# Patient Record
Sex: Male | Born: 1960
Health system: Southern US, Community
[De-identification: ages and names within clinical notes are randomized; demographics above are authoritative.]

## PROBLEM LIST (undated history)

## (undated) DIAGNOSIS — K7581 Nonalcoholic steatohepatitis (NASH): Secondary | ICD-10-CM

## (undated) DIAGNOSIS — E782 Mixed hyperlipidemia: Secondary | ICD-10-CM

## (undated) DIAGNOSIS — Z87442 Personal history of urinary calculi: Secondary | ICD-10-CM

## (undated) DIAGNOSIS — Z86010 Personal history of colonic polyps: Secondary | ICD-10-CM

## (undated) DIAGNOSIS — Z860101 Personal history of adenomatous and serrated colon polyps: Secondary | ICD-10-CM

## (undated) DIAGNOSIS — R351 Nocturia: Secondary | ICD-10-CM

## (undated) DIAGNOSIS — N200 Calculus of kidney: Secondary | ICD-10-CM

## (undated) DIAGNOSIS — E291 Testicular hypofunction: Secondary | ICD-10-CM

## (undated) DIAGNOSIS — I10 Essential (primary) hypertension: Secondary | ICD-10-CM

## (undated) DIAGNOSIS — E785 Hyperlipidemia, unspecified: Secondary | ICD-10-CM

## (undated) DIAGNOSIS — F32A Depression, unspecified: Secondary | ICD-10-CM

## (undated) DIAGNOSIS — K635 Polyp of colon: Secondary | ICD-10-CM

## (undated) DIAGNOSIS — K649 Unspecified hemorrhoids: Secondary | ICD-10-CM

## (undated) DIAGNOSIS — Z973 Presence of spectacles and contact lenses: Secondary | ICD-10-CM

## (undated) DIAGNOSIS — Z8719 Personal history of other diseases of the digestive system: Secondary | ICD-10-CM

## (undated) DIAGNOSIS — H409 Unspecified glaucoma: Secondary | ICD-10-CM

## (undated) DIAGNOSIS — D509 Iron deficiency anemia, unspecified: Secondary | ICD-10-CM

## (undated) DIAGNOSIS — F419 Anxiety disorder, unspecified: Secondary | ICD-10-CM

## (undated) DIAGNOSIS — F329 Major depressive disorder, single episode, unspecified: Secondary | ICD-10-CM

## (undated) DIAGNOSIS — D649 Anemia, unspecified: Secondary | ICD-10-CM

## (undated) HISTORY — DX: Anxiety disorder, unspecified: F41.9

## (undated) HISTORY — DX: Calculus of kidney: N20.0

## (undated) HISTORY — DX: Depression, unspecified: F32.A

## (undated) HISTORY — DX: Polyp of colon: K63.5

## (undated) HISTORY — DX: Major depressive disorder, single episode, unspecified: F32.9

## (undated) HISTORY — DX: Hyperlipidemia, unspecified: E78.5

## (undated) HISTORY — DX: Testicular hypofunction: E29.1

## (undated) HISTORY — DX: Unspecified glaucoma: H40.9

## (undated) HISTORY — DX: Anemia, unspecified: D64.9

## (undated) HISTORY — DX: Essential (primary) hypertension: I10

## (undated) HISTORY — PX: WISDOM TOOTH EXTRACTION: SHX21

## (undated) HISTORY — PX: CYSTOSCOPY: SUR368

## (undated) HISTORY — PX: PENECTOMY: SHX741

## (undated) HISTORY — PX: PROSTATE BIOPSY: SHX241

---

## 2008-08-27 DIAGNOSIS — E291 Testicular hypofunction: Secondary | ICD-10-CM

## 2008-08-27 HISTORY — DX: Testicular hypofunction: E29.1

## 2009-05-27 LAB — HM COLONOSCOPY

## 2009-08-18 HISTORY — PX: COLONOSCOPY: SHX174

## 2009-08-18 LAB — HM COLONOSCOPY

## 2009-08-22 LAB — HM COLONOSCOPY

## 2011-09-24 ENCOUNTER — Ambulatory Visit (INDEPENDENT_AMBULATORY_CARE_PROVIDER_SITE_OTHER): Payer: BC Managed Care – PPO

## 2011-09-24 DIAGNOSIS — B9789 Other viral agents as the cause of diseases classified elsewhere: Secondary | ICD-10-CM

## 2011-10-12 ENCOUNTER — Telehealth: Payer: Self-pay

## 2011-10-23 NOTE — Telephone Encounter (Signed)
Encounter made in error. 

## 2011-11-05 ENCOUNTER — Encounter: Payer: Self-pay | Admitting: Internal Medicine

## 2011-11-05 ENCOUNTER — Ambulatory Visit (INDEPENDENT_AMBULATORY_CARE_PROVIDER_SITE_OTHER): Payer: BC Managed Care – PPO | Admitting: Internal Medicine

## 2011-11-05 ENCOUNTER — Other Ambulatory Visit (INDEPENDENT_AMBULATORY_CARE_PROVIDER_SITE_OTHER): Payer: BC Managed Care – PPO

## 2011-11-05 VITALS — BP 116/80 | HR 79 | Temp 98.3°F | Resp 16 | Ht 75.0 in | Wt 177.0 lb

## 2011-11-05 DIAGNOSIS — E291 Testicular hypofunction: Secondary | ICD-10-CM

## 2011-11-05 DIAGNOSIS — Z0000 Encounter for general adult medical examination without abnormal findings: Secondary | ICD-10-CM

## 2011-11-05 LAB — CBC WITH DIFFERENTIAL/PLATELET
Basophils Relative: 1 % (ref 0.0–3.0)
Eosinophils Absolute: 0.1 10*3/uL (ref 0.0–0.7)
HCT: 44.8 % (ref 39.0–52.0)
Lymphs Abs: 1.5 10*3/uL (ref 0.7–4.0)
MCHC: 34 g/dL (ref 30.0–36.0)
MCV: 92.3 fl (ref 78.0–100.0)
Monocytes Absolute: 0.4 10*3/uL (ref 0.1–1.0)
Neutrophils Relative %: 60.8 % (ref 43.0–77.0)
Platelets: 222 10*3/uL (ref 150.0–400.0)
RBC: 4.86 Mil/uL (ref 4.22–5.81)

## 2011-11-05 LAB — URINALYSIS, ROUTINE W REFLEX MICROSCOPIC
Nitrite: NEGATIVE
Specific Gravity, Urine: 1.02 (ref 1.000–1.030)
Total Protein, Urine: NEGATIVE
Urine Glucose: NEGATIVE
pH: 6 (ref 5.0–8.0)

## 2011-11-05 LAB — PSA: PSA: 2.02 ng/mL (ref 0.10–4.00)

## 2011-11-05 LAB — COMPREHENSIVE METABOLIC PANEL
AST: 21 U/L (ref 0–37)
Albumin: 4.5 g/dL (ref 3.5–5.2)
Alkaline Phosphatase: 70 U/L (ref 39–117)
BUN: 15 mg/dL (ref 6–23)
Glucose, Bld: 77 mg/dL (ref 70–99)
Potassium: 3.8 mEq/L (ref 3.5–5.1)
Total Bilirubin: 0.4 mg/dL (ref 0.3–1.2)

## 2011-11-05 LAB — LIPID PANEL: HDL: 43.4 mg/dL (ref >39.00)

## 2011-11-05 MED ORDER — TESTOSTERONE 50 MG/5GM (1%) TD GEL: 5.0000 g | Freq: Every day | TRANSDERMAL | Status: DC

## 2011-11-05 NOTE — Assessment & Plan Note (Signed)
Exam done, labs ordered, vaccines were updated, pt ed material was given

## 2011-11-05 NOTE — Patient Instructions (Signed)
Health Maintenance, Males A healthy lifestyle and preventative care can promote health and wellness.  Maintain regular health, dental, and eye exams.   Eat a healthy diet. Foods like vegetables, fruits, whole grains, low-fat dairy products, and lean protein foods contain the nutrients you need without too many calories. Decrease your intake of foods high in solid fats, added sugars, and salt. Get information about a proper diet from your caregiver, if necessary.   Regular physical exercise is one of the most important things you can do for your health. Most adults should get at least 150 minutes of moderate-intensity exercise (any activity that increases your heart rate and causes you to sweat) each week. In addition, most adults need muscle-strengthening exercises on 2 or more days a week.    Maintain a healthy weight. The body mass index (BMI) is a screening tool to identify possible weight problems. It provides an estimate of body fat based on height and weight. Your caregiver can help determine your BMI, and can help you achieve or maintain a healthy weight. For adults 20 years and older:   A BMI below 18.5 is considered underweight.   A BMI of 18.5 to 24.9 is normal.   A BMI of 25 to 29.9 is considered overweight.   A BMI of 30 and above is considered obese.   Maintain normal blood lipids and cholesterol by exercising and minimizing your intake of saturated fat. Eat a balanced diet with plenty of fruits and vegetables. Blood tests for lipids and cholesterol should begin at age 20 and be repeated every 5 years. If your lipid or cholesterol levels are high, you are over 50, or you are a high risk for heart disease, you may need your cholesterol levels checked more frequently.Ongoing high lipid and cholesterol levels should be treated with medicines, if diet and exercise are not effective.   If you smoke, find out from your caregiver how to quit. If you do not use tobacco, do not start.    If you choose to drink alcohol, do not exceed 2 drinks per day. One drink is considered to be 12 ounces (355 mL) of beer, 5 ounces (148 mL) of wine, or 1.5 ounces (44 mL) of liquor.   Avoid use of street drugs. Do not share needles with anyone. Ask for help if you need support or instructions about stopping the use of drugs.   High blood pressure causes heart disease and increases the risk of stroke. Blood pressure should be checked at least every 1 to 2 years. Ongoing high blood pressure should be treated with medicines if weight loss and exercise are not effective.   If you are 45 to 52 years old, ask your caregiver if you should take aspirin to prevent heart disease.   Diabetes screening involves taking a blood sample to check your fasting blood sugar level. This should be done once every 3 years, after age 45, if you are within normal weight and without risk factors for diabetes. Testing should be considered at a younger age or be carried out more frequently if you are overweight and have at least 1 risk factor for diabetes.   Colorectal cancer can be detected and often prevented. Most routine colorectal cancer screening begins at the age of 50 and continues through age 75. However, your caregiver may recommend screening at an earlier age if you have risk factors for colon cancer. On a yearly basis, your caregiver may provide home test kits to check for hidden   blood in the stool. Use of a small camera at the end of a tube, to directly examine the colon (sigmoidoscopy or colonoscopy), can detect the earliest forms of colorectal cancer. Talk to your caregiver about this at age 50, when routine screening begins. Direct examination of the colon should be repeated every 5 to 10 years through age 75, unless early forms of pre-cancerous polyps or small growths are found.   Hepatitis C blood testing is recommended for all people born from 1945 through 1965 and any individual with known risks for  hepatitis C.   Healthy men should no longer receive prostate-specific antigen (PSA) blood tests as part of routine cancer screening. Consult with your caregiver about prostate cancer screening.   Testicular cancer screening is not recommended for adolescents or adult males who have no symptoms. Screening includes self-exam, caregiver exam, and other screening tests. Consult with your caregiver about any symptoms you have or any concerns you have about testicular cancer.   Practice safe sex. Use condoms and avoid high-risk sexual practices to reduce the spread of sexually transmitted infections (STIs).   Use sunscreen with a sun protection factor (SPF) of 30 or greater. Apply sunscreen liberally and repeatedly throughout the day. You should seek shade when your shadow is shorter than you. Protect yourself by wearing long sleeves, pants, a wide-brimmed hat, and sunglasses year round, whenever you are outdoors.   Notify your caregiver of new moles or changes in moles, especially if there is a change in shape or color. Also notify your caregiver if a mole is larger than the size of a pencil eraser.   A one-time screening for abdominal aortic aneurysm (AAA) and surgical repair of large AAAs by sound wave imaging (ultrasonography) is recommended for ages 65 to 75 years who are current or former smokers.   Stay current with your immunizations.  Document Released: 02/09/2008 Document Revised: 08/02/2011 Document Reviewed: 01/08/2011 [employer] Patient Information 2012 [employer]. 

## 2011-11-05 NOTE — Assessment & Plan Note (Signed)
He has not used the testim recently, today I will check his testosterone level and offer him to get back on testim if he wants to

## 2011-11-05 NOTE — Progress Notes (Signed)
  Subjective:    Patient ID: Andrew Wright, male    DOB: 1960/10/07, 51 y.o.   MRN: [id number]  HPI New to me for a complete physical, he offers no complaints today.   Review of Systems  Constitutional: Negative.   HENT: Negative.   Eyes: Negative.   Respiratory: Negative.   Cardiovascular: Negative.   Gastrointestinal: Negative.   Genitourinary: Negative.   Musculoskeletal: Negative.   Skin: Negative.   Neurological: Negative.   Hematological: Negative.   Psychiatric/Behavioral: Negative.        Objective:   Physical Exam  Vitals reviewed. Constitutional: He is oriented to person, place, and time. He appears well-developed and well-nourished. No distress.  HENT:  Head: Normocephalic and atraumatic.  Mouth/Throat: Oropharynx is clear and moist. No oropharyngeal exudate.  Eyes: Conjunctivae are normal. Right eye exhibits no discharge. Left eye exhibits no discharge. No scleral icterus.  Neck: Normal range of motion. Neck supple. No JVD present. No tracheal deviation present. No thyromegaly present.  Cardiovascular: Normal rate, regular rhythm, normal heart sounds and intact distal pulses.  Exam reveals no gallop and no friction rub.   No murmur heard. Pulmonary/Chest: Effort normal and breath sounds normal. No stridor. No respiratory distress. He has no wheezes. He has no rales. He exhibits no tenderness.  Abdominal: Soft. Bowel sounds are normal. He exhibits no distension and no mass. There is no tenderness. There is no rebound and no guarding. Hernia confirmed negative in the right inguinal area and confirmed negative in the left inguinal area.  Genitourinary: Rectum normal, prostate normal, testes normal and penis normal. Rectal exam shows no external hemorrhoid, no internal hemorrhoid, no fissure, no mass, no tenderness and anal tone normal. Guaiac negative stool. Prostate is not enlarged and not tender. Right testis shows no mass, no swelling and no tenderness. Right testis is  descended. Left testis shows no mass, no swelling and no tenderness. Left testis is descended. Circumcised. No penile tenderness. No discharge found.  Musculoskeletal: Normal range of motion. He exhibits no edema and no tenderness.  Lymphadenopathy:    He has no cervical adenopathy.       Right: No inguinal adenopathy present.       Left: No inguinal adenopathy present.  Neurological: He is oriented to person, place, and time.  Skin: Skin is warm and dry. No rash noted. He is not diaphoretic. No erythema. No pallor.  Psychiatric: He has a normal mood and affect. His behavior is normal. Judgment and thought content normal.          Assessment & Plan:

## 2011-11-06 ENCOUNTER — Encounter: Payer: Self-pay | Admitting: Internal Medicine

## 2011-11-06 LAB — TESTOSTERONE, FREE, TOTAL, SHBG
Sex Hormone Binding: 22 nmol/L (ref 13–71)
Testosterone, Free: 61.8 pg/mL (ref 47.0–244.0)
Testosterone-% Free: 2.4 % (ref 1.6–2.9)
Testosterone: 257.12 ng/dL (ref 250–890)

## 2011-11-06 LAB — LDL CHOLESTEROL, DIRECT: Direct LDL: 122.3 mg/dL

## 2011-12-24 ENCOUNTER — Ambulatory Visit (INDEPENDENT_AMBULATORY_CARE_PROVIDER_SITE_OTHER): Payer: BC Managed Care – PPO | Admitting: Internal Medicine

## 2011-12-24 ENCOUNTER — Encounter: Payer: Self-pay | Admitting: Internal Medicine

## 2011-12-24 VITALS — BP 132/84 | HR 67 | Temp 98.1°F | Resp 16 | Wt 176.0 lb

## 2011-12-24 DIAGNOSIS — E785 Hyperlipidemia, unspecified: Secondary | ICD-10-CM | POA: Insufficient documentation

## 2011-12-24 DIAGNOSIS — E782 Mixed hyperlipidemia: Secondary | ICD-10-CM

## 2011-12-24 DIAGNOSIS — E291 Testicular hypofunction: Secondary | ICD-10-CM

## 2011-12-24 NOTE — Assessment & Plan Note (Signed)
He feels much better on testosterone replacement

## 2011-12-24 NOTE — Patient Instructions (Signed)
Hypertriglyceridemia  Diet for High blood levels of Triglycerides Most fats in food are triglycerides. Triglycerides in your blood are stored as fat in your body. High levels of triglycerides in your blood may put you at a greater risk for heart disease and stroke.  Normal triglyceride levels are less than 150 mg/dL. Borderline high levels are 150-199 mg/dl. High levels are 200 - 499 mg/dL, and very high triglyceride levels are greater than 500 mg/dL. The decision to treat high triglycerides is generally based on the level. For people with borderline or high triglyceride levels, treatment includes weight loss and exercise. Drugs are recommended for people with very high triglyceride levels. Many people who need treatment for high triglyceride levels have metabolic syndrome. This syndrome is a collection of disorders that often include: insulin resistance, high blood pressure, blood clotting problems, high cholesterol and triglycerides. TESTING PROCEDURE FOR TRIGLYCERIDES  You should not eat 4 hours before getting your triglycerides measured. The normal range of triglycerides is between 10 and 250 milligrams per deciliter (mg/dl). Some people may have extreme levels (1000 or above), but your triglyceride level may be too high if it is above 150 mg/dl, depending on what other risk factors you have for heart disease.   People with high blood triglycerides may also have high blood cholesterol levels. If you have high blood cholesterol as well as high blood triglycerides, your risk for heart disease is probably greater than if you only had high triglycerides. High blood cholesterol is one of the main risk factors for heart disease.  CHANGING YOUR DIET  Your weight can affect your blood triglyceride level. If you are more than 20% above your ideal body weight, you may be able to lower your blood triglycerides by losing weight. Eating less and exercising regularly is the best way to combat this. Fat provides  more calories than any other food. The best way to lose weight is to eat less fat. Only 30% of your total calories should come from fat. Less than 7% of your diet should come from saturated fat. A diet low in fat and saturated fat is the same as a diet to decrease blood cholesterol. By eating a diet lower in fat, you may lose weight, lower your blood cholesterol, and lower your blood triglyceride level.  Eating a diet low in fat, especially saturated fat, may also help you lower your blood triglyceride level. Ask your dietitian to help you figure how much fat you can eat based on the number of calories your caregiver has prescribed for you.  Exercise, in addition to helping with weight loss may also help lower triglyceride levels.   Alcohol can increase blood triglycerides. You may need to stop drinking alcoholic beverages.   Too much carbohydrate in your diet may also increase your blood triglycerides. Some complex carbohydrates are necessary in your diet. These may include bread, rice, potatoes, other starchy vegetables and cereals.   Reduce "simple" carbohydrates. These may include pure sugars, candy, honey, and jelly without losing other nutrients. If you have the kind of high blood triglycerides that is affected by the amount of carbohydrates in your diet, you will need to eat less sugar and less high-sugar foods. Your caregiver can help you with this.   Adding 2-4 grams of fish oil (EPA+ DHA) may also help lower triglycerides. Speak with your caregiver before adding any supplements to your regimen.  Following the Diet  Maintain your ideal weight. Your caregivers can help you with a diet. Generally,  eating less food and getting more exercise will help you lose weight. Joining a weight control group may also help. Ask your caregivers for a good weight control group in your area.  Eat low-fat foods instead of high-fat foods. This can help you lose weight too.  These foods are lower in fat. Eat MORE  of these:   Dried beans, peas, and lentils.   Egg whites.   Low-fat cottage cheese.   Fish.   Lean cuts of meat, such as round, sirloin, rump, and flank (cut extra fat off meat you fix).   Whole grain breads, cereals and pasta.   Skim and nonfat dry milk.   Low-fat yogurt.   Poultry without the skin.   Cheese made with skim or part-skim milk, such as mozzarella, parmesan, farmers', ricotta, or pot cheese.  These are higher fat foods. Eat LESS of these:   Whole milk and foods made from whole milk, such as American, blue, cheddar, monterey jack, and swiss cheese   High-fat meats, such as luncheon meats, sausages, knockwurst, bratwurst, hot dogs, ribs, corned beef, ground pork, and regular ground beef.   Fried foods.  Limit saturated fats in your diet. Substituting unsaturated fat for saturated fat may decrease your blood triglyceride level. You will need to read package labels to know which products contain saturated fats.  These foods are high in saturated fat. Eat LESS of these:   Fried pork skins.   Whole milk.   Skin and fat from poultry.   Palm oil.   Butter.   Shortening.   Cream cheese.   Berniece Salines.   Margarines and baked goods made from listed oils.   Vegetable shortenings.   Chitterlings.   Fat from meats.   Coconut oil.   Palm kernel oil.   Lard.   Cream.   Sour cream.   Fatback.   Coffee whiteners and non-dairy creamers made with these oils.   Cheese made from whole milk.  Use unsaturated fats (both polyunsaturated and monounsaturated) moderately. Remember, even though unsaturated fats are better than saturated fats; you still want a diet low in total fat.  These foods are high in unsaturated fat:   Canola oil.   Sunflower oil.   Mayonnaise.   Almonds.   Peanuts.   Pine nuts.   Margarines made with these oils.   Safflower oil.   Olive oil.   Avocados.   Cashews.   Peanut butter.   Sunflower seeds.   Soybean oil.     Peanut oil.   Olives.   Pecans.   Walnuts.   Pumpkin seeds.  Avoid sugar and other high-sugar foods. This will decrease carbohydrates without decreasing other nutrients. Sugar in your food goes rapidly to your blood. When there is excess sugar in your blood, your liver may use it to make more triglycerides. Sugar also contains calories without other important nutrients.  Eat LESS of these:   Sugar, brown sugar, powdered sugar, jam, jelly, preserves, honey, syrup, molasses, pies, candy, cakes, cookies, frosting, pastries, colas, soft drinks, punches, fruit drinks, and regular gelatin.   Avoid alcohol. Alcohol, even more than sugar, may increase blood triglycerides. In addition, alcohol is high in calories and low in nutrients. Ask for sparkling water, or a diet soft drink instead of an alcoholic beverage.  Suggestions for planning and preparing meals   Bake, broil, grill or roast meats instead of frying.   Remove fat from meats and skin from poultry before cooking.   Add spices,  herbs, lemon juice or vinegar to vegetables instead of salt, rich sauces or gravies.   Use a non-stick skillet without fat or use no-stick sprays.   Cool and refrigerate stews and broth. Then remove the hardened fat floating on the surface before serving.   Refrigerate meat drippings and skim off fat to make low-fat gravies.   Serve more fish.   Use less butter, margarine and other high-fat spreads on bread or vegetables.   Use skim or reconstituted non-fat dry milk for cooking.   Cook with low-fat cheeses.   Substitute low-fat yogurt or cottage cheese for all or part of the sour cream in recipes for sauces, dips or congealed salads.   Use half yogurt/half mayonnaise in salad recipes.   Substitute evaporated skim milk for cream. Evaporated skim milk or reconstituted non-fat dry milk can be whipped and substituted for whipped cream in certain recipes.   Choose fresh fruits for dessert instead of  high-fat foods such as pies or cakes. Fruits are naturally low in fat.  When Dining Out   Order low-fat appetizers such as fruit or vegetable juice, pasta with vegetables or tomato sauce.   Select clear, rather than cream soups.   Ask that dressings and gravies be served on the side. Then use less of them.   Order foods that are baked, broiled, poached, steamed, stir-fried, or roasted.   Ask for margarine instead of butter, and use only a small amount.   Drink sparkling water, unsweetened tea or coffee, or diet soft drinks instead of alcohol or other sweet beverages.  QUESTIONS AND ANSWERS ABOUT OTHER FATS IN THE BLOOD: SATURATED FAT, TRANS FAT, AND CHOLESTEROL What is trans fat? Trans fat is a type of fat that is formed when vegetable oil is hardened through a process called hydrogenation. This process helps makes foods more solid, gives them shape, and prolongs their shelf life. Trans fats are also called hydrogenated or partially hydrogenated oils.  What do saturated fat, trans fat, and cholesterol in foods have to do with heart disease? Saturated fat, trans fat, and cholesterol in the diet all raise the level of LDL "bad" cholesterol in the blood. The higher the LDL cholesterol, the greater the risk for coronary heart disease (CHD). Saturated fat and trans fat raise LDL similarly.  What foods contain saturated fat, trans fat, and cholesterol? High amounts of saturated fat are found in animal products, such as fatty cuts of meat, chicken skin, and full-fat dairy products like butter, whole milk, cream, and cheese, and in tropical vegetable oils such as palm, palm kernel, and coconut oil. Trans fat is found in some of the same foods as saturated fat, such as vegetable shortening, some margarines (especially hard or stick margarine), crackers, cookies, baked goods, fried foods, salad dressings, and other processed foods made with partially hydrogenated vegetable oils. Small amounts of trans fat  also occur naturally in some animal products, such as milk products, beef, and lamb. Foods high in cholesterol include liver, other organ meats, egg yolks, shrimp, and full-fat dairy products. How can I use the new food label to make heart-healthy food choices? Check the Nutrition Facts panel of the food label. Choose foods lower in saturated fat, trans fat, and cholesterol. For saturated fat and cholesterol, you can also use the Percent Daily Value (%DV): 5% DV or less is low, and 20% DV or more is high. (There is no %DV for trans fat.) Use the Nutrition Facts panel to choose foods low in  saturated fat and cholesterol, and if the trans fat is not listed, read the ingredients and limit products that list shortening or hydrogenated or partially hydrogenated vegetable oil, which tend to be high in trans fat. POINTS TO REMEMBER: YOU NEED A LITTLE TLC (THERAPEUTIC LIFESTYLE CHANGES)  Discuss your risk for heart disease with your caregivers, and take steps to reduce risk factors.   Change your diet. Choose foods that are low in saturated fat, trans fat, and cholesterol.   Add exercise to your daily routine if it is not already being done. Participate in physical activity of moderate intensity, like brisk walking, for at least 30 minutes on most, and preferably all days of the week. No time? Break the 30 minutes into three, 10-minute segments during the day.   Stop smoking. If you do smoke, contact your caregiver to discuss ways in which they can help you quit.   Do not use street drugs.   Maintain a normal weight.   Maintain a healthy blood pressure.   Keep up with your blood work for checking the fats in your blood as directed by your caregiver.  Document Released: 05/31/2004 Document Revised: 08/02/2011 Document Reviewed: 12/27/2008 [facility] Patient Information 2012 [place].

## 2011-12-24 NOTE — Progress Notes (Signed)
  Subjective:    Patient ID: Andrew Wright, male    DOB: 07-Mar-1961, 51 y.o.   MRN: [id number]  Hyperlipidemia This is a chronic problem. The current episode started more than 1 month ago. The problem is uncontrolled. Recent lipid tests were reviewed and are variable. He has no history of chronic renal disease, diabetes, hypothyroidism, liver disease, obesity or nephrotic syndrome. Factors aggravating his hyperlipidemia include fatty foods. Pertinent negatives include no chest pain, focal sensory loss, focal weakness, leg pain, myalgias or shortness of breath. Current antihyperlipidemic treatment includes diet change. The current treatment provides mild improvement of lipids. Compliance problems include adherence to diet.  Risk factors for coronary artery disease include no known risk factors.      Review of Systems  Constitutional: Negative for fever, chills, diaphoresis, activity change, appetite change, fatigue and unexpected weight change.  HENT: Negative.   Eyes: Negative.   Respiratory: Negative for apnea, cough, choking, chest tightness, shortness of breath, wheezing and stridor.   Cardiovascular: Negative for chest pain, palpitations and leg swelling.  Gastrointestinal: Negative for nausea, vomiting, abdominal pain and diarrhea.  Genitourinary: Negative.   Musculoskeletal: Negative for myalgias, back pain, joint swelling, arthralgias and gait problem.  Skin: Negative for color change, pallor, rash and wound.  Neurological: Negative for dizziness, tremors, focal weakness, seizures, syncope, facial asymmetry, speech difficulty, weakness, light-headedness, numbness and headaches.  Hematological: Negative for adenopathy. Does not bruise/bleed easily.  Psychiatric/Behavioral: Negative.        Objective:   Physical Exam  Vitals reviewed. Constitutional: He is oriented to person, place, and time. He appears well-developed and well-nourished. No distress.  HENT:  Head: Normocephalic and  atraumatic.  Mouth/Throat: Oropharynx is clear and moist. No oropharyngeal exudate.  Eyes: Conjunctivae are normal. Right eye exhibits no discharge. Left eye exhibits no discharge. No scleral icterus.  Neck: Normal range of motion. Neck supple. No JVD present. No tracheal deviation present. No thyromegaly present.  Cardiovascular: Normal rate, regular rhythm, normal heart sounds and intact distal pulses.  Exam reveals no gallop and no friction rub.   No murmur heard. Pulmonary/Chest: Effort normal and breath sounds normal. No stridor. No respiratory distress. He has no wheezes. He has no rales. He exhibits no tenderness.  Abdominal: Soft. Bowel sounds are normal. He exhibits no distension and no mass. There is no tenderness. There is no rebound and no guarding.  Musculoskeletal: Normal range of motion. He exhibits no edema and no tenderness.  Lymphadenopathy:    He has no cervical adenopathy.  Neurological: He is oriented to person, place, and time.  Skin: Skin is warm and dry. No rash noted. He is not diaphoretic. No erythema. No pallor.  Psychiatric: He has a normal mood and affect. His behavior is normal. Judgment and thought content normal.      Lab Results  Component Value Date   WBC 5.1 11/05/2011   HGB 15.2 11/05/2011   HCT 44.8 11/05/2011   PLT 222.0 11/05/2011   GLUCOSE 77 11/05/2011   CHOL 214* 11/05/2011   TRIG 252.0* 11/05/2011   HDL 43.40 11/05/2011   LDLDIRECT 122.3 11/05/2011   ALT 24 11/05/2011   AST 21 11/05/2011   NA 140 11/05/2011   K 3.8 11/05/2011   CL 103 11/05/2011   CREATININE 1.0 11/05/2011   BUN 15 11/05/2011   CO2 31 11/05/2011   PSA 2.02 11/05/2011      Assessment & Plan:

## 2011-12-24 NOTE — Assessment & Plan Note (Signed)
I will recheck his trig level today

## 2012-02-06 ENCOUNTER — Other Ambulatory Visit (INDEPENDENT_AMBULATORY_CARE_PROVIDER_SITE_OTHER): Payer: BC Managed Care – PPO

## 2012-02-06 DIAGNOSIS — E782 Mixed hyperlipidemia: Secondary | ICD-10-CM

## 2012-02-06 LAB — LIPID PANEL: HDL: 41 mg/dL (ref >39.00)

## 2012-02-06 LAB — LDL CHOLESTEROL, DIRECT: Direct LDL: 81.5 mg/dL

## 2012-10-13 ENCOUNTER — Ambulatory Visit (INDEPENDENT_AMBULATORY_CARE_PROVIDER_SITE_OTHER): Payer: BC Managed Care – PPO | Admitting: Internal Medicine

## 2012-10-13 ENCOUNTER — Encounter: Payer: Self-pay | Admitting: Internal Medicine

## 2012-10-13 VITALS — BP 102/76 | HR 85 | Temp 97.5°F | Ht 75.0 in | Wt 180.0 lb

## 2012-10-13 DIAGNOSIS — B029 Zoster without complications: Secondary | ICD-10-CM

## 2012-10-13 MED ORDER — VALACYCLOVIR HCL 1 G PO TABS: 1000.0000 mg | ORAL_TABLET | Freq: Three times a day (TID) | ORAL | Status: DC

## 2012-10-13 NOTE — Progress Notes (Signed)
Subjective:    Patient ID: Andrew Wright, male    DOB: Jan 22, 1961, 52 y.o.   MRN: [id number]  HPI  Pt presents to the clinic today with c/o a rash below his left ear. He noticed this Thursday night. The rash does not itch. It is painful. The pain he describes as sharp, shooting and tingling behind his ear. He has never had a rash like this before. He has been putting calamine lotion and alcohol on it but it has not gotten any better.  Review of Systems      Past Medical History  Diagnosis Date  . Migraine   . Hypogonadism male 2010    Current Outpatient Prescriptions  Medication Sig Dispense Refill  . Ascorbic Acid (VITAMIN C) 1000 MG tablet Take 1,000 mg by mouth daily.      Marland Kitchen testosterone (TESTIM) 50 MG/5GM GEL Place 5 g onto the skin daily.  30 Tube  5  . vitamin B-12 (CYANOCOBALAMIN) 1000 MCG tablet Take 1,000 mcg by mouth daily.       No current facility-administered medications for this visit.    No Known Allergies  Family History  Problem Relation Age of Onset  . Arthritis Other   . Cancer Other     Prostate, Breast,Overian cancer  . Stroke Other     History   Social History  . Marital Status: Married    Spouse Name: N/A    Number of Children: N/A  . Years of Education: N/A   Occupational History  . Not on file.   Social History Main Topics  . Smoking status: Never Smoker   . Smokeless tobacco: Never Used  . Alcohol Use: No  . Drug Use: No  . Sexually Active: Yes   Other Topics Concern  . Not on file   Social History Narrative   Caffienated drinks-yes   Seat belt use often-yes   Regular Exercise-yes   Smoke alarm in the home-yes   Firearms/guns in the home-no   History of physical abuse-no                    Constitutional: Denies fever, malaise, fatigue, headache or abrupt weight changes.  HEENT: Pt reports ringing in his left ear. Denies eye pain, eye redness, ear pain, wax buildup, runny nose, nasal congestion, bloody nose, or sore  throat. Skin: Pt reports rash below left ear.  Neurological: Pt reports some sharp, shooting, tingling pain behind his ear. Denies dizziness, difficulty with memory, difficulty with speech or problems with balance and coordination.   No other specific complaints in a complete review of systems (except as listed in HPI above).  Objective:   Physical Exam  BP 102/76  Pulse 85  Temp(Src) 97.5 F (36.4 C) (Oral)  Ht 6' 3"  (1.905 m)  Wt 180 lb (81.647 kg)  BMI 22.5 kg/m2  SpO2 96% Wt Readings from Last 3 Encounters:  10/13/12 180 lb (81.647 kg)  12/24/11 176 lb (79.833 kg)  11/05/11 177 lb (80.287 kg)    General: Appears his stated age, well developed, well nourished in NAD. Skin: 2 moderate sized patches of vesicular lesions on erythematous base resembling shingles just below the left ear. HEENT: Head: normal shape and size; Eyes: sclera white, no icterus, conjunctiva pink, PERRLA and EOMs intact; Ears: Tm's gray and intact, normal light reflex; Nose: mucosa pink and moist, septum midline; Throat/Mouth: Teeth present, mucosa pink and moist, no exudate, lesions or ulcerations noted.   Cardiovascular: Normal rate and  rhythm. S1,S2 noted.  No murmur, rubs or gallops noted. No JVD or BLE edema. No carotid bruits noted. Pulmonary/Chest: Normal effort and positive vesicular breath sounds. No respiratory distress. No wheezes, rales or ronchi noted.   Neurological: Alert and oriented. Cranial nerves II-XII intact. Coordination normal. +DTRs bilaterally        Assessment & Plan:   Shingles, new onset with additional workup required:  eRx for Valtrex 1000 mg TID x 7 days Stop placing rubbing alcohol on the area Cleanse with warm water and soap When this clears, call to have the shingles vaccined  RTC as needed or if symptoms persist

## 2012-10-13 NOTE — Patient Instructions (Signed)

## 2012-11-03 ENCOUNTER — Ambulatory Visit (INDEPENDENT_AMBULATORY_CARE_PROVIDER_SITE_OTHER): Payer: BC Managed Care – PPO

## 2012-11-03 DIAGNOSIS — Z23 Encounter for immunization: Secondary | ICD-10-CM

## 2012-11-03 DIAGNOSIS — Z2911 Encounter for prophylactic immunotherapy for respiratory syncytial virus (RSV): Secondary | ICD-10-CM

## 2013-11-11 ENCOUNTER — Ambulatory Visit (INDEPENDENT_AMBULATORY_CARE_PROVIDER_SITE_OTHER): Payer: BC Managed Care – PPO | Admitting: Internal Medicine

## 2013-11-11 ENCOUNTER — Encounter: Payer: Self-pay | Admitting: Internal Medicine

## 2013-11-11 VITALS — BP 120/70 | HR 80 | Temp 97.9°F | Resp 16 | Ht 75.0 in | Wt 190.4 lb

## 2013-11-11 DIAGNOSIS — J209 Acute bronchitis, unspecified: Secondary | ICD-10-CM | POA: Insufficient documentation

## 2013-11-11 MED ORDER — HYDROCOD POLST-CHLORPHEN POLST 10-8 MG/5ML PO LQCR: 5.0000 mL | Freq: Two times a day (BID) | ORAL | Status: DC | PRN

## 2013-11-11 MED ORDER — MOXIFLOXACIN HCL 400 MG PO TABS
400.0000 mg | ORAL_TABLET | Freq: Every day | ORAL | Status: DC
Start: 2013-11-11 — End: 2014-10-28

## 2013-11-11 NOTE — Progress Notes (Signed)
Pre visit review using our clinic review tool, if applicable. No additional management support is needed unless otherwise documented below in the visit note. 

## 2013-11-11 NOTE — Patient Instructions (Signed)
Acute Bronchitis Bronchitis is inflammation of the airways that extend from the windpipe into the lungs (bronchi). The inflammation often causes mucus to develop. This leads to a cough, which is the most common symptom of bronchitis.  In acute bronchitis, the condition usually develops suddenly and goes away over time, usually in a couple weeks. Smoking, allergies, and asthma can make bronchitis worse. Repeated episodes of bronchitis may cause further lung problems.  CAUSES Acute bronchitis is most often caused by the same virus that causes a cold. The virus can spread from person to person (contagious).  SIGNS AND SYMPTOMS   Cough.   Fever.   Coughing up mucus.   Body aches.   Chest congestion.   Chills.   Shortness of breath.   Sore throat.  DIAGNOSIS  Acute bronchitis is usually diagnosed through a physical exam. Tests, such as chest X-rays, are sometimes done to rule out other conditions.  TREATMENT  Acute bronchitis usually goes away in a couple weeks. Often times, no medical treatment is necessary. Medicines are sometimes given for relief of fever or cough. Antibiotics are usually not needed but may be prescribed in certain situations. In some cases, an inhaler may be recommended to help reduce shortness of breath and control the cough. A cool mist vaporizer may also be used to help thin bronchial secretions and make it easier to clear the chest.  HOME CARE INSTRUCTIONS  Get plenty of rest.   Drink enough fluids to keep your urine clear or pale yellow (unless you have a medical condition that requires fluid restriction). Increasing fluids may help thin your secretions and will prevent dehydration.   Only take over-the-counter or prescription medicines as directed by your health care provider.   Avoid smoking and secondhand smoke. Exposure to cigarette smoke or irritating chemicals will make bronchitis worse. If you are a smoker, consider using nicotine gum or skin  patches to help control withdrawal symptoms. Quitting smoking will help your lungs heal faster.   Reduce the chances of another bout of acute bronchitis by washing your hands frequently, avoiding people with cold symptoms, and trying not to touch your hands to your mouth, nose, or eyes.   Follow up with your health care provider as directed.  SEEK MEDICAL CARE IF: Your symptoms do not improve after 1 week of treatment.  SEEK IMMEDIATE MEDICAL CARE IF:  You develop an increased fever or chills.   You have chest pain.   You have severe shortness of breath.  You have bloody sputum.   You develop dehydration.  You develop fainting.  You develop repeated vomiting.  You develop a severe headache. MAKE SURE YOU:   Understand these instructions.  Will watch your condition.  Will get help right away if you are not doing well or get worse. Document Released: 09/20/2004 Document Revised: 04/15/2013 Document Reviewed: 02/03/2013 [employer] Patient Information 2014 [employer].  

## 2013-11-11 NOTE — Progress Notes (Signed)
Subjective:    Patient ID: Andrew Wright, male    DOB: [birth year], 53 y.o.   MRN: [id number]  Cough This is a recurrent problem. The current episode started 1 to 4 weeks ago. The problem has been gradually worsening. The problem occurs every few hours. The cough is productive of purulent sputum. Associated symptoms include chills, nasal congestion and rhinorrhea. Pertinent negatives include no chest pain, ear congestion, ear pain, fever, headaches, heartburn, hemoptysis, myalgias, postnasal drip, rash, sore throat, shortness of breath, sweats, weight loss or wheezing. Nothing aggravates the symptoms. He has tried OTC cough suppressant for the symptoms. The treatment provided mild relief. There is no history of asthma, bronchiectasis, bronchitis, COPD, emphysema, environmental allergies or pneumonia.      Review of Systems  Constitutional: Positive for chills. Negative for fever, weight loss, diaphoresis, activity change, appetite change, fatigue and unexpected weight change.  HENT: Positive for rhinorrhea. Negative for congestion, ear pain, facial swelling, postnasal drip, sinus pressure, sore throat and trouble swallowing.   Eyes: Negative.   Respiratory: Positive for cough. Negative for apnea, hemoptysis, choking, chest tightness, shortness of breath, wheezing and stridor.   Cardiovascular: Negative.  Negative for chest pain, palpitations and leg swelling.  Gastrointestinal: Negative.  Negative for heartburn and abdominal pain.  Endocrine: Negative.   Genitourinary: Negative.   Musculoskeletal: Negative.  Negative for myalgias.  Skin: Negative.  Negative for rash.  Allergic/Immunologic: Negative.  Negative for environmental allergies.  Neurological: Negative.  Negative for headaches.  Hematological: Negative.  Negative for adenopathy. Does not bruise/bleed easily.  Psychiatric/Behavioral: Negative.        Objective:   Physical Exam  Vitals reviewed. Constitutional: He is oriented  to person, place, and time. He appears well-developed and well-nourished.  Non-toxic appearance. He does not have a sickly appearance. He does not appear ill. No distress.  HENT:  Head: Normocephalic and atraumatic.  Mouth/Throat: Oropharynx is clear and moist. No oropharyngeal exudate.  Eyes: Conjunctivae are normal. Right eye exhibits no discharge. Left eye exhibits no discharge. No scleral icterus.  Neck: Normal range of motion. Neck supple. No JVD present. No tracheal deviation present. No thyromegaly present.  Cardiovascular: Normal rate, regular rhythm, normal heart sounds and intact distal pulses.  Exam reveals no gallop and no friction rub.   No murmur heard. Pulmonary/Chest: Effort normal and breath sounds normal. No stridor. No respiratory distress. He has no wheezes. He has no rales. He exhibits no tenderness.  Abdominal: Soft. Bowel sounds are normal. He exhibits no distension and no mass. There is no tenderness. There is no rebound and no guarding.  Musculoskeletal: Normal range of motion. He exhibits no edema and no tenderness.  Lymphadenopathy:    He has no cervical adenopathy.  Neurological: He is oriented to person, place, and time.  Skin: Skin is warm and dry. No rash noted. He is not diaphoretic. No erythema. No pallor.  Psychiatric: He has a normal mood and affect. His behavior is normal. Judgment and thought content normal.     Lab Results  Component Value Date   WBC 5.1 11/05/2011   HGB 15.2 11/05/2011   HCT 44.8 11/05/2011   PLT 222.0 11/05/2011   GLUCOSE 77 11/05/2011   CHOL 170 02/06/2012   TRIG 258.0* 02/06/2012   HDL 41.00 02/06/2012   LDLDIRECT 81.5 02/06/2012   ALT 24 11/05/2011   AST 21 11/05/2011   NA 140 11/05/2011   K 3.8 11/05/2011   CL 103 11/05/2011   CREATININE 1.0 11/05/2011  BUN 15 11/05/2011   CO2 31 11/05/2011   PSA 2.02 11/05/2011       Assessment & Plan:

## 2013-11-12 ENCOUNTER — Encounter: Payer: Self-pay | Admitting: Internal Medicine

## 2013-11-12 NOTE — Assessment & Plan Note (Signed)
I will treat the infection with Avelox and will control the cough with tussionex susp

## 2014-07-23 ENCOUNTER — Encounter: Payer: Self-pay | Admitting: Family Medicine

## 2014-07-23 ENCOUNTER — Ambulatory Visit (INDEPENDENT_AMBULATORY_CARE_PROVIDER_SITE_OTHER): Payer: BC Managed Care – PPO | Admitting: Family Medicine

## 2014-07-23 VITALS — BP 116/80 | HR 85 | Temp 98.1°F | Resp 16 | Wt 193.5 lb

## 2014-07-23 DIAGNOSIS — J029 Acute pharyngitis, unspecified: Secondary | ICD-10-CM

## 2014-07-23 DIAGNOSIS — J020 Streptococcal pharyngitis: Secondary | ICD-10-CM

## 2014-07-23 LAB — POCT RAPID STREP A (OFFICE): Rapid Strep A Screen: POSITIVE — AB

## 2014-07-23 MED ORDER — AMOXICILLIN 875 MG PO TABS: 875.0000 mg | ORAL_TABLET | Freq: Two times a day (BID) | ORAL | Status: DC

## 2014-07-23 NOTE — Patient Instructions (Signed)
Follow up as needed Start the Amoxicillin twice daily- take w/ food Drink plenty of fluids REST! Alternate ibuprofen (advil) and tylenol every 4 hrs as need for pain/fever Call with any questions or concerns Hang in there!

## 2014-07-23 NOTE — Progress Notes (Signed)
   Subjective:    Patient ID: Andrew Wright, male    DOB: 1960/11/27, 53 y.o.   MRN: [id number]  HPI Sore throat- 'my throat is all yellow'.  Noted 'yellow and blood' in back of throat yesterday.  sxs started 4-5 days ago.  Difficult to swallow.  Started salt water gargles yesterday w/ some relief.  No fevers.  Normal appetite, normal BMs.  + sick contacts.  No cough.  + L sinus pain.   Review of Systems For ROS see HPI     Objective:   Physical Exam  Constitutional: He is oriented to person, place, and time. He appears well-developed and well-nourished. No distress.  HENT:  Head: Normocephalic and atraumatic.  Nose: Nose normal.  Mouth/Throat: Oropharyngeal exudate present.  TMs WNL bilaterally + tonsillar enlargement w/ exudate bilaterally  Cardiovascular: Normal rate, regular rhythm and normal heart sounds.   Pulmonary/Chest: Effort normal and breath sounds normal. No respiratory distress. He has no wheezes. He has no rales.  Lymphadenopathy:    He has cervical adenopathy.  Neurological: He is alert and oriented to person, place, and time.  Skin: Skin is warm and dry.  Vitals reviewed.         Assessment & Plan:

## 2014-07-23 NOTE — Progress Notes (Signed)
Pre visit review using our clinic review tool, if applicable. No additional management support is needed unless otherwise documented below in the visit note. 

## 2014-07-23 NOTE — Assessment & Plan Note (Signed)
New.  Pt's sxs and PE consistent w/ strep.  Confirmed by rapid strep.  Start abx.  Reviewed supportive care and red flags that should prompt return.  Pt expressed understanding and is in agreement w/ plan.

## 2014-07-27 ENCOUNTER — Telehealth: Payer: Self-pay | Admitting: *Deleted

## 2014-07-27 NOTE — Telephone Encounter (Signed)
[facility] Triage Call Report Triage Record Num: [id number] Operator: Tommie Ard Patient Name: Andrew Wright "perry" [facility] Call Date & Time: 07/22/2014 4:35:02PM Patient Phone: [phone] PCP: [name] Patient Gender: Male PCP Fax : Patient DOB: 18-Oct-1960 Practice Name: [facility] Reason for Call: Caller: Perry/Patient; PCP: [name] (Adults only); CB#: [phone]; Call regarding Sore Throat; He reports he feels something around his voice box, +sore throat. He states back of his throat is reddish/orange in color. Pain with swallowing. Onset of symptoms Tuesday 07/20/14 with worsening. Afebrile. Home treatment- none. Emergent s/sx ruled out per Sore Throat or Hoarsness Protocol with the exception to "covered by yellow white patches". See provider in 24 hours. Care advice and call back parameters reviewed. Understanding expressed. Appt scheduled. 07/23/14 at 09:45 with Dr. Birdie Riddle for care. Undestanding expressed. Protocol(s) Used: Sore Throat or Hoarseness Recommended Outcome per Protocol: See Provider within 24 hours Reason for Outcome: Has enlarged tonsils covered by yellow-white patches Care Advice: ~ SYMPTOM / CONDITION MANAGEMENT ~ Call 911 if voice muffled, is unable to swallow own saliva and is drooling or choking sensation. Sore Throat Relief: - Use salt water gargles (1/2 teaspoon salt in 8 oz. [246m] warm water) every one to two hours. - Use a vaporizer or cool mist humidifier in the room when sleeping. - Suck on hard candy, nonprescription or herbal throat lozenges (sugar-free if diabetic) - Eat soothing, soft food/fluids (broths, soups, or honey and lemon juice in hot tea, Popsicles, frozen yogurt or sherbet, scrambled eggs, cooked cereals, Jell-O or puddings) whichever is most comforting. - Avoid eating salty, spicy or acidic foods. ~ Total water intake includes drinking water, water in beverages, and water contained in food. Fluids make up about 80% of the  body's total hydration need. Individual fluid requirement to maintain hydration vary based on physical activity, environmental factors and illness. Limit fluids that contain sugar, caffeine, or alcohol. Urine will be very light yellow color when you drink enough fluids. ~ 07/22/2014 4:50:10PM Page 1 of 1 [id number]

## 2014-10-28 ENCOUNTER — Ambulatory Visit (INDEPENDENT_AMBULATORY_CARE_PROVIDER_SITE_OTHER): Payer: BLUE CROSS/BLUE SHIELD | Admitting: Family

## 2014-10-28 ENCOUNTER — Encounter: Payer: Self-pay | Admitting: Family

## 2014-10-28 VITALS — BP 142/90 | HR 100 | Temp 98.5°F | Resp 18 | Wt 191.2 lb

## 2014-10-28 DIAGNOSIS — J019 Acute sinusitis, unspecified: Secondary | ICD-10-CM | POA: Insufficient documentation

## 2014-10-28 MED ORDER — AMOXICILLIN-POT CLAVULANATE 875-125 MG PO TABS: 1.0000 | ORAL_TABLET | Freq: Two times a day (BID) | ORAL | Status: DC

## 2014-10-28 MED ORDER — FLUTICASONE PROPIONATE 50 MCG/ACT NA SUSP: 2.0000 | Freq: Every day | NASAL | Status: DC

## 2014-10-28 NOTE — Progress Notes (Signed)
Pre visit review using our clinic review tool, if applicable. No additional management support is needed unless otherwise documented below in the visit note. 

## 2014-10-28 NOTE — Patient Instructions (Addendum)
Thank you for choosing [facility].  Summary/Instructions:  Your prescription(s) have been submitted to your pharmacy or been printed and provided for you. Please take as directed and contact our office if you believe you are having problem(s) with the medication(s) or have any questions.  If your symptoms worsen or fail to improve, please contact our office for further instruction, or in case of emergency go directly to the emergency room at the closest medical facility.   General Recommendations:    Please drink plenty of fluids.  Get plenty of rest   Sleep in humidified air  Use saline nasal sprays  Netti pot   OTC Medications:  Decongestants - helps relieve congestion   Flonase (generic fluticasone) or Nasacort (generic triamcinolone) - please make sure to use the "cross-over" technique at a 45 degree angle towards the opposite eye as opposed to straight up the nasal passageway.   If you have HIGH BLOOD PRESSURE - Coricidin HBP; AVOID any product that is -D as this contains pseudoephedrine which may increase your blood pressure.  Afrin (oxymetazoline) every 6-8 hours for up to 3 days.   Allergies - helps relieve runny nose, itchy eyes and sneezing   Claritin (generic loratidine), Allegra (fexofenidine), or Zyrtec (generic cyrterizine) for runny nose. These medications should not cause drowsiness.  Note - Benadryl (generic diphenhydramine) may be used however may cause drowsiness  Cough -   Delsym or Robitussin (generic dextromethorphan)  Expectorants - helps loosen mucus to ease removal   Mucinex (generic guaifenesin) as directed on the package.  Headaches / General Aches   Tylenol (generic acetaminophen) - DO NOT EXCEED 3 grams (3,000 mg) in a 24 hour time period  Advil/Motrin (generic ibuprofen)   Sore Throat -   Salt water gargle   Chloraseptic (generic benzocaine) spray or lozenges / Sucrets (generic dyclonine)      Sinusitis Sinusitis  is redness, soreness, and inflammation of the paranasal sinuses. Paranasal sinuses are air pockets within the bones of your face (beneath the eyes, the middle of the forehead, or above the eyes). In healthy paranasal sinuses, mucus is able to drain out, and air is able to circulate through them by way of your nose. However, when your paranasal sinuses are inflamed, mucus and air can become trapped. This can allow bacteria and other germs to grow and cause infection. Sinusitis can develop quickly and last only a short time (acute) or continue over a long period (chronic). Sinusitis that lasts for more than 12 weeks is considered chronic.  CAUSES  Causes of sinusitis include: 3. Allergies. 4. Structural abnormalities, such as displacement of the cartilage that separates your nostrils (deviated septum), which can decrease the air flow through your nose and sinuses and affect sinus drainage. 5. Functional abnormalities, such as when the small hairs (cilia) that line your sinuses and help remove mucus do not work properly or are not present. SIGNS AND SYMPTOMS  Symptoms of acute and chronic sinusitis are the same. The primary symptoms are pain and pressure around the affected sinuses. Other symptoms include: 2. Upper toothache. 3. Earache. 4. Headache. 5. Bad breath. 6. Decreased sense of smell and taste. 7. A cough, which worsens when you are lying flat. 8. Fatigue. 9. Fever. 10. Thick drainage from your nose, which often is green and may contain pus (purulent). 11. Swelling and warmth over the affected sinuses. DIAGNOSIS  Your health care provider will perform a physical exam. During the exam, your health care provider may: 2. Look  in your nose for signs of abnormal growths in your nostrils (nasal polyps). 3. Tap over the affected sinus to check for signs of infection. 4. View the inside of your sinuses (endoscopy) using an imaging device that has a light attached (endoscope). If your health  care provider suspects that you have chronic sinusitis, one or more of the following tests may be recommended: 3. Allergy tests. 4. Nasal culture. A sample of mucus is taken from your nose, sent to a lab, and screened for bacteria. 5. Nasal cytology. A sample of mucus is taken from your nose and examined by your health care provider to determine if your sinusitis is related to an allergy. TREATMENT  Most cases of acute sinusitis are related to a viral infection and will resolve on their own within 10 days. Sometimes medicines are prescribed to help relieve symptoms (pain medicine, decongestants, nasal steroid sprays, or saline sprays).  However, for sinusitis related to a bacterial infection, your health care provider will prescribe antibiotic medicines. These are medicines that will help kill the bacteria causing the infection.  Rarely, sinusitis is caused by a fungal infection. In theses cases, your health care provider will prescribe antifungal medicine. For some cases of chronic sinusitis, surgery is needed. Generally, these are cases in which sinusitis recurs more than 3 times per year, despite other treatments. HOME CARE INSTRUCTIONS  3. Drink plenty of water. Water helps thin the mucus so your sinuses can drain more easily. 4. Use a humidifier. 5. Inhale steam 3 to 4 times a day (for example, sit in the bathroom with the shower running). 6. Apply a warm, moist washcloth to your face 3 to 4 times a day, or as directed by your health care provider. 7. Use saline nasal sprays to help moisten and clean your sinuses. 8. Take medicines only as directed by your health care provider. 9. If you were prescribed either an antibiotic or antifungal medicine, finish it all even if you start to feel better. SEEK IMMEDIATE MEDICAL CARE IF: 6. You have increasing pain or severe headaches. 7. You have nausea, vomiting, or drowsiness. 8. You have swelling around your face. 9. You have vision  problems. 10. You have a stiff neck. 11. You have difficulty breathing. MAKE SURE YOU:   Understand these instructions.  Will watch your condition.  Will get help right away if you are not doing well or get worse. Document Released: 08/13/2005 Document Revised: 12/28/2013 Document Reviewed: 08/28/2011 [facility] Patient Information 2015 [place], [place]. This information is not intended to replace advice given to you by your health care provider. Make sure you discuss any questions you have with your health care provider.

## 2014-10-28 NOTE — Assessment & Plan Note (Signed)
Symptoms and exam consistent with sinusitis. Start Augmentin. Start Flonase as needed for congestion. Continue over-the-counter medications as needed for symptom relief and supportive care. Follow-up if symptoms worsen or fail to improve.

## 2014-10-28 NOTE — Progress Notes (Signed)
   Subjective:    Patient ID: Andrew Wright, male    DOB: 09/02/1960, 54 y.o.   MRN: [id number]  Chief Complaint  Patient presents with  . Sinus pressure    Drainage, fatigue, sinus pressure headache, congestion and body aches x4 days    HPI:  Andrew Wright is a 54 y.o. male who presents today for an acute visit.  This is a new problem. Associated symptoms of drainage, fatigue, sinus pressure headaches, congestion and body ache was been going on for 4 days. Has taken ibuprofen which has helped minimally. Over the past 4 days his symptoms have progressively worsened with no improvements.   No Known Allergies  Current Outpatient Prescriptions on File Prior to Visit  Medication Sig Dispense Refill  . latanoprost (XALATAN) 0.005 % ophthalmic solution Place 1 drop into both eyes at bedtime.     No current facility-administered medications on file prior to visit.    Review of Systems  Constitutional: Negative for fever and chills.  HENT: Positive for congestion and sinus pressure. Negative for sore throat.   Respiratory: Negative for cough, chest tightness and shortness of breath.   Neurological: Positive for headaches.      Objective:    BP 142/90 mmHg  Pulse 100  Temp(Src) 98.5 F (36.9 C) (Oral)  Resp 18  Wt 191 lb 3.2 oz (86.728 kg)  SpO2 95% Nursing note and vital signs reviewed.  Physical Exam  Constitutional: He is oriented to person, place, and time. He appears well-developed and well-nourished. No distress.  HENT:  Head: Normocephalic.  Right Ear: Hearing, tympanic membrane, external ear and ear canal normal.  Left Ear: Hearing, tympanic membrane, external ear and ear canal normal.  Nose: Right sinus exhibits maxillary sinus tenderness and frontal sinus tenderness. Left sinus exhibits maxillary sinus tenderness and frontal sinus tenderness.  Mouth/Throat: Uvula is midline, oropharynx is clear and moist and mucous membranes are normal.  Cardiovascular: Normal  rate, regular rhythm, normal heart sounds and intact distal pulses.   Pulmonary/Chest: Effort normal and breath sounds normal.  Neurological: He is alert and oriented to person, place, and time.  Skin: Skin is warm and dry.  Psychiatric: He has a normal mood and affect. His behavior is normal. Judgment and thought content normal.       Assessment & Plan:

## 2014-11-25 ENCOUNTER — Ambulatory Visit (INDEPENDENT_AMBULATORY_CARE_PROVIDER_SITE_OTHER): Payer: BLUE CROSS/BLUE SHIELD | Admitting: Internal Medicine

## 2014-11-25 ENCOUNTER — Other Ambulatory Visit (INDEPENDENT_AMBULATORY_CARE_PROVIDER_SITE_OTHER): Payer: BLUE CROSS/BLUE SHIELD

## 2014-11-25 ENCOUNTER — Encounter: Payer: Self-pay | Admitting: Internal Medicine

## 2014-11-25 VITALS — BP 118/80 | HR 92 | Temp 98.8°F | Ht 75.0 in | Wt 188.2 lb

## 2014-11-25 DIAGNOSIS — E781 Pure hyperglyceridemia: Secondary | ICD-10-CM | POA: Diagnosis not present

## 2014-11-25 DIAGNOSIS — A630 Anogenital (venereal) warts: Secondary | ICD-10-CM | POA: Diagnosis not present

## 2014-11-25 DIAGNOSIS — R7989 Other specified abnormal findings of blood chemistry: Secondary | ICD-10-CM

## 2014-11-25 DIAGNOSIS — Z0000 Encounter for general adult medical examination without abnormal findings: Secondary | ICD-10-CM

## 2014-11-25 LAB — CBC WITH DIFFERENTIAL/PLATELET
BASOS PCT: 0.5 % (ref 0.0–3.0)
Basophils Absolute: 0 10*3/uL (ref 0.0–0.1)
EOS PCT: 1.4 % (ref 0.0–5.0)
Eosinophils Absolute: 0.1 10*3/uL (ref 0.0–0.7)
HCT: 43.7 % (ref 39.0–52.0)
Hemoglobin: 15.4 g/dL (ref 13.0–17.0)
LYMPHS PCT: 31.4 % (ref 12.0–46.0)
Lymphs Abs: 1.5 10*3/uL (ref 0.7–4.0)
MCHC: 35.2 g/dL (ref 30.0–36.0)
MCV: 88.5 fl (ref 78.0–100.0)
MONOS PCT: 8.8 % (ref 3.0–12.0)
Monocytes Absolute: 0.4 10*3/uL (ref 0.1–1.0)
NEUTROS PCT: 57.9 % (ref 43.0–77.0)
Neutro Abs: 2.8 10*3/uL (ref 1.4–7.7)
Platelets: 221 10*3/uL (ref 150.0–400.0)
RBC: 4.94 Mil/uL (ref 4.22–5.81)
RDW: 12.6 % (ref 11.5–15.5)
WBC: 4.8 10*3/uL (ref 4.0–10.5)

## 2014-11-25 LAB — COMPREHENSIVE METABOLIC PANEL
ALBUMIN: 4.3 g/dL (ref 3.5–5.2)
ALK PHOS: 73 U/L (ref 39–117)
ALT: 46 U/L (ref 0–53)
AST: 27 U/L (ref 0–37)
BUN: 12 mg/dL (ref 6–23)
CALCIUM: 9.5 mg/dL (ref 8.4–10.5)
CHLORIDE: 104 meq/L (ref 96–112)
CO2: 31 mEq/L (ref 19–32)
Creatinine, Ser: 0.96 mg/dL (ref 0.40–1.50)
GFR: 86.43 mL/min (ref >60.00)
Glucose, Bld: 87 mg/dL (ref 70–99)
POTASSIUM: 4 meq/L (ref 3.5–5.1)
SODIUM: 141 meq/L (ref 135–145)
TOTAL PROTEIN: 6.6 g/dL (ref 6.0–8.3)
Total Bilirubin: 0.6 mg/dL (ref 0.2–1.2)

## 2014-11-25 LAB — LIPID PANEL
CHOL/HDL RATIO: 6
Cholesterol: 192 mg/dL (ref 0–200)
HDL: 33.2 mg/dL — AB (ref >39.00)

## 2014-11-25 LAB — TSH: TSH: 1.93 u[IU]/mL (ref 0.35–4.50)

## 2014-11-25 LAB — PSA: PSA: 3.97 ng/mL (ref 0.10–4.00)

## 2014-11-25 LAB — LDL CHOLESTEROL, DIRECT: Direct LDL: 79 mg/dL

## 2014-11-25 LAB — FECAL OCCULT BLOOD, GUAIAC: Fecal Occult Blood: NEGATIVE

## 2014-11-25 NOTE — Progress Notes (Signed)
Pre visit review using our clinic review tool, if applicable. No additional management support is needed unless otherwise documented below in the visit note. 

## 2014-11-25 NOTE — Patient Instructions (Signed)

## 2014-11-26 DIAGNOSIS — E781 Pure hyperglyceridemia: Secondary | ICD-10-CM | POA: Insufficient documentation

## 2014-11-26 MED ORDER — OMEGA-3-ACID ETHYL ESTERS 1 G PO CAPS: 2.0000 g | ORAL_CAPSULE | Freq: Two times a day (BID) | ORAL | Status: DC

## 2014-11-26 NOTE — Progress Notes (Signed)
   Subjective:    Patient ID: Andrew Wright, male    DOB: [birth year], 54 y.o.   MRN: [id number]  HPI Comments: He returns for a physical - he feels well and offers no complaints.     Review of Systems  Constitutional: Negative.   HENT: Negative.   Eyes: Negative.   Respiratory: Negative.   Cardiovascular: Negative.   Gastrointestinal: Negative.   Endocrine: Negative.   Genitourinary: Negative.   Musculoskeletal: Negative.   Skin: Negative.   Allergic/Immunologic: Negative.   Neurological: Negative.   Hematological: Negative.   Psychiatric/Behavioral: Negative.        Objective:   Physical Exam  Constitutional: He is oriented to person, place, and time. He appears well-developed and well-nourished. No distress.  HENT:  Head: Normocephalic and atraumatic.  Mouth/Throat: Oropharynx is clear and moist. No oropharyngeal exudate.  Eyes: Conjunctivae are normal. Right eye exhibits no discharge. Left eye exhibits no discharge. No scleral icterus.  Neck: Normal range of motion. Neck supple. No JVD present. No tracheal deviation present. No thyromegaly present.  Cardiovascular: Normal rate, regular rhythm, normal heart sounds and intact distal pulses.  Exam reveals no gallop and no friction rub.   No murmur heard. Pulmonary/Chest: Effort normal and breath sounds normal. No stridor. No respiratory distress. He has no wheezes. He has no rales. He exhibits no tenderness.  Abdominal: Soft. Bowel sounds are normal. He exhibits no distension and no mass. There is no tenderness. There is no rebound and no guarding. Hernia confirmed negative in the right inguinal area and confirmed negative in the left inguinal area.  Genitourinary: Rectum normal, prostate normal, testes normal and penis normal. Rectal exam shows no external hemorrhoid, no internal hemorrhoid, no fissure, no mass, no tenderness and anal tone normal. Guaiac negative stool.    Prostate is not enlarged and not tender. Right  testis shows no mass, no swelling and no tenderness. Right testis is descended. Left testis shows no mass, no swelling and no tenderness. Left testis is descended. Circumcised. No penile erythema or penile tenderness. No discharge found.  Musculoskeletal: Normal range of motion. He exhibits no edema or tenderness.  Lymphadenopathy:    He has no cervical adenopathy.       Right: No inguinal adenopathy present.       Left: No inguinal adenopathy present.  Neurological: He is oriented to person, place, and time.  Skin: Skin is warm and dry. No rash noted. He is not diaphoretic. No erythema. No pallor.  Psychiatric: He has a normal mood and affect. His behavior is normal. Judgment and thought content normal.  Vitals reviewed.   Lab Results  Component Value Date   WBC 4.8 11/25/2014   HGB 15.4 11/25/2014   HCT 43.7 11/25/2014   PLT 221.0 11/25/2014   GLUCOSE 87 11/25/2014   CHOL 192 11/25/2014   TRIG * 11/25/2014    442.0 Triglyceride is over 400; calculations on Lipids are invalid.   HDL 33.20* 11/25/2014   LDLDIRECT 79.0 11/25/2014   ALT 46 11/25/2014   AST 27 11/25/2014   NA 141 11/25/2014   K 4.0 11/25/2014   CL 104 11/25/2014   CREATININE 0.96 11/25/2014   BUN 12 11/25/2014   CO2 31 11/25/2014   TSH 1.93 11/25/2014   PSA 3.97 11/25/2014       Assessment & Plan:

## 2014-11-26 NOTE — Assessment & Plan Note (Signed)
Vaccines were reviewed and updated Exam done Labs ordered Pt ed material was given 

## 2014-11-26 NOTE — Assessment & Plan Note (Signed)
Refer to GU to consider excision

## 2014-11-26 NOTE — Assessment & Plan Note (Signed)
He will work on his lifestyle modifications Will start omega-3-fish oils as well

## 2015-05-25 ENCOUNTER — Encounter: Payer: Self-pay | Admitting: Internal Medicine

## 2015-05-25 ENCOUNTER — Ambulatory Visit (INDEPENDENT_AMBULATORY_CARE_PROVIDER_SITE_OTHER): Payer: BLUE CROSS/BLUE SHIELD | Admitting: Internal Medicine

## 2015-05-25 VITALS — BP 130/78 | HR 85 | Temp 98.8°F | Resp 16 | Ht 75.0 in | Wt 190.0 lb

## 2015-05-25 DIAGNOSIS — E781 Pure hyperglyceridemia: Secondary | ICD-10-CM

## 2015-05-25 DIAGNOSIS — E782 Mixed hyperlipidemia: Secondary | ICD-10-CM

## 2015-05-25 DIAGNOSIS — J0100 Acute maxillary sinusitis, unspecified: Secondary | ICD-10-CM | POA: Insufficient documentation

## 2015-05-25 MED ORDER — AMOXICILLIN 875 MG PO TABS: 875.0000 mg | ORAL_TABLET | Freq: Two times a day (BID) | ORAL | Status: AC

## 2015-05-25 MED ORDER — PHENYLEPH-PROMETHAZINE-COD 5-6.25-10 MG/5ML PO SYRP: 5.0000 mL | ORAL_SOLUTION | Freq: Four times a day (QID) | ORAL | Status: DC | PRN

## 2015-05-25 NOTE — Patient Instructions (Signed)

## 2015-05-25 NOTE — Progress Notes (Signed)
Pre visit review using our clinic review tool, if applicable. No additional management support is needed unless otherwise documented below in the visit note. 

## 2015-05-26 NOTE — Progress Notes (Signed)
Subjective:  Patient ID: Andrew Wright, male    DOB: [birth year]  Age: 54 y.o. MRN: [id number]  CC: Sinusitis   HPI Andrew Wright presents for a 4 day history of sinus infection with facial pain, runny nose, postnasal drip, sore throat, mild nonproductive cough.  Outpatient Prescriptions Prior to Visit  Medication Sig Dispense Refill  . fluticasone (FLONASE) 50 MCG/ACT nasal spray Place 2 sprays into both nostrils daily. 16 g 1  . latanoprost (XALATAN) 0.005 % ophthalmic solution Place 1 drop into both eyes at bedtime.    Marland Kitchen omega-3 acid ethyl esters (LOVAZA) 1 G capsule Take 2 capsules (2 g total) by mouth 2 (two) times daily. 120 capsule 11   No facility-administered medications prior to visit.    ROS Review of Systems  Constitutional: Negative.  Negative for fever, chills, diaphoresis, appetite change and fatigue.  HENT: Positive for congestion, nosebleeds, postnasal drip, rhinorrhea, sinus pressure and sore throat. Negative for ear discharge, ear pain, sneezing, tinnitus, trouble swallowing and voice change.   Eyes: Negative.   Respiratory: Positive for cough. Negative for choking, chest tightness, shortness of breath and stridor.   Cardiovascular: Negative.  Negative for chest pain, palpitations and leg swelling.  Gastrointestinal: Negative.  Negative for nausea, abdominal pain, diarrhea, constipation and blood in stool.  Endocrine: Negative.   Genitourinary: Negative.   Musculoskeletal: Negative.   Skin: Negative.   Allergic/Immunologic: Negative.   Neurological: Negative.   Hematological: Negative.  Negative for adenopathy. Does not bruise/bleed easily.  Psychiatric/Behavioral: Negative.     Objective:  BP 130/78 mmHg  Pulse 85  Temp(Src) 98.8 F (37.1 C) (Oral)  Resp 16  Ht 6' 3"  (1.905 m)  Wt 190 lb (86.183 kg)  BMI 23.75 kg/m2  SpO2 96%  BP Readings from Last 3 Encounters:  05/25/15 130/78  11/25/14 118/80  10/28/14 142/90    Wt Readings from Last 3  Encounters:  05/25/15 190 lb (86.183 kg)  11/25/14 188 lb 4 oz (85.39 kg)  10/28/14 191 lb 3.2 oz (86.728 kg)    Physical Exam  Constitutional: He is oriented to person, place, and time. He appears well-developed and well-nourished.  Non-toxic appearance. He does not have a sickly appearance. He does not appear ill. No distress.  HENT:  Head: Normocephalic and atraumatic.  Right Ear: Hearing, tympanic membrane, external ear and ear canal normal.  Left Ear: Hearing, tympanic membrane, external ear and ear canal normal.  Nose: No mucosal edema or rhinorrhea. Right sinus exhibits maxillary sinus tenderness. Right sinus exhibits no frontal sinus tenderness. Left sinus exhibits maxillary sinus tenderness. Left sinus exhibits no frontal sinus tenderness.  Mouth/Throat: Mucous membranes are normal. Mucous membranes are not pale, not dry and not cyanotic. No trismus in the jaw. No uvula swelling. Posterior oropharyngeal erythema present. No oropharyngeal exudate, posterior oropharyngeal edema or tonsillar abscesses.  Eyes: Conjunctivae are normal. Right eye exhibits no discharge. Left eye exhibits no discharge. No scleral icterus.  Neck: Normal range of motion. Neck supple. No JVD present. No tracheal deviation present. No thyromegaly present.  Cardiovascular: Normal rate, regular rhythm, normal heart sounds and intact distal pulses.  Exam reveals no gallop and no friction rub.   No murmur heard. Pulmonary/Chest: Effort normal and breath sounds normal. No stridor. No respiratory distress. He has no wheezes. He has no rales. He exhibits no tenderness.  Abdominal: Soft. Bowel sounds are normal. He exhibits no distension and no mass. There is no tenderness. There is no rebound and no guarding.  Musculoskeletal: Normal range of motion. He exhibits no edema or tenderness.  Lymphadenopathy:    He has no cervical adenopathy.  Neurological: He is oriented to person, place, and time.  Skin: Skin is warm and  dry. No rash noted. He is not diaphoretic. No erythema. No pallor.  Vitals reviewed.   Lab Results  Component Value Date   WBC 4.8 11/25/2014   HGB 15.4 11/25/2014   HCT 43.7 11/25/2014   PLT 221.0 11/25/2014   GLUCOSE 87 11/25/2014   CHOL 192 11/25/2014   TRIG * 11/25/2014    442.0 Triglyceride is over 400; calculations on Lipids are invalid.   HDL 33.20* 11/25/2014   LDLDIRECT 79.0 11/25/2014   ALT 46 11/25/2014   AST 27 11/25/2014   NA 141 11/25/2014   K 4.0 11/25/2014   CL 104 11/25/2014   CREATININE 0.96 11/25/2014   BUN 12 11/25/2014   CO2 31 11/25/2014   TSH 1.93 11/25/2014   PSA 3.97 11/25/2014    Patient was never admitted.  Assessment & Plan:   Andrew Wright was seen today for sinusitis.  Diagnoses and all orders for this visit:  Acute maxillary sinusitis, recurrence not specified- I will treat the infection with amoxicillin and of offered a combination of a cough suppressant and decongestant for symptom relief. -     Phenyleph-Promethazine-Cod 5-6.25-10 MG/5ML SYRP; Take 5 mLs by mouth 4 (four) times daily as needed. -     amoxicillin (AMOXIL) 875 MG tablet; Take 1 tablet (875 mg total) by mouth 2 (two) times daily.  Hypertriglyceridemia -     Lipid panel; Future  Mixed hyperlipidemia -     Lipid panel; Future   I am having Andrew Wright start on Phenyleph-Promethazine-Cod and amoxicillin. I am also having him maintain his latanoprost, fluticasone, and omega-3 acid ethyl esters.  Meds ordered this encounter  Medications  . Phenyleph-Promethazine-Cod 5-6.25-10 MG/5ML SYRP    Sig: Take 5 mLs by mouth 4 (four) times daily as needed.    Dispense:  180 mL    Refill:  0  . amoxicillin (AMOXIL) 875 MG tablet    Sig: Take 1 tablet (875 mg total) by mouth 2 (two) times daily.    Dispense:  20 tablet    Refill:  0     Follow-up: Return in about 3 weeks (around 06/15/2015).  [name], MD

## 2015-06-17 ENCOUNTER — Ambulatory Visit (INDEPENDENT_AMBULATORY_CARE_PROVIDER_SITE_OTHER): Payer: BLUE CROSS/BLUE SHIELD | Admitting: Physician Assistant

## 2015-06-17 ENCOUNTER — Telehealth: Payer: Self-pay | Admitting: Internal Medicine

## 2015-06-17 VITALS — BP 140/90 | HR 88 | Temp 98.4°F | Ht 74.5 in | Wt 189.0 lb

## 2015-06-17 DIAGNOSIS — S90121A Contusion of right lesser toe(s) without damage to nail, initial encounter: Secondary | ICD-10-CM

## 2015-06-17 NOTE — Progress Notes (Signed)
[facility] [address] [place] [address] [phone]  Date:  06/17/2015   Name:  Andrew Wright   DOB:  1961-01-08   MRN:  [id number]  PCP:  [name], MD    History of Present Illness:  Andrew Wright is a 54 y.o. male patient who presents to [facility] for chief complaint of right 2nd toe discoloration.  Patient was at his home 4 hours ago, when he felt some discomfort at his right toe.  He looked down and noticed that the toe was markedly a dark bluish purple along the plantar side of toe.  He had very little discomfort with rubbing the toe.  Normal sensation.  No swelling.  He had no other symptoms but was concerned of the appearance and came here.  He had no known trauma, though about an hour before symptoms and color change he was walking along the roads.  He recalls stepping on a tree limb awkwardly which may have had a residual effect, but does not recall.  He is not an easy bruiser or bleeder.  No bleeding hx.    He also states that his shoes are very fitted and recently placed insoles in the shoe.    Patient Active Problem List   Diagnosis Date Noted  . Acute maxillary sinusitis 05/25/2015  . Hypertriglyceridemia 11/26/2014  . Mixed hyperlipidemia 12/24/2011  . Routine general medical examination at a health care facility 11/05/2011  . Hypogonadism male 11/05/2011    Past Medical History  Diagnosis Date  . Migraine   . Hypogonadism male 2010    No past surgical history on file.  Social History  Substance Use Topics  . Smoking status: Never Smoker   . Smokeless tobacco: Never Used  . Alcohol Use: No    Family History  Problem Relation Age of Onset  . Arthritis Other   . Cancer Other     Prostate, Breast,Overian cancer  . Stroke Other     No Known Allergies  Medication list has been reviewed and updated.  Current Outpatient Prescriptions on File Prior to Visit  Medication Sig Dispense Refill  . latanoprost (XALATAN) 0.005 %  ophthalmic solution Place 1 drop into both eyes at bedtime.    Marland Kitchen omega-3 acid ethyl esters (LOVAZA) 1 G capsule Take 2 capsules (2 g total) by mouth 2 (two) times daily. 120 capsule 11  . fluticasone (FLONASE) 50 MCG/ACT nasal spray Place 2 sprays into both nostrils daily. (Patient not taking: Reported on 06/17/2015) 16 g 1  . Phenyleph-Promethazine-Cod 5-6.25-10 MG/5ML SYRP Take 5 mLs by mouth 4 (four) times daily as needed. (Patient not taking: Reported on 06/17/2015) 180 mL 0   No current facility-administered medications on file prior to visit.    ROS ROS otherwise unremarkable unless listed above.   Physical Examination: BP 140/90 mmHg  Pulse 88  Temp(Src) 98.4 F (36.9 C) (Oral)  Ht 6' 2.5" (1.892 m)  Wt 189 lb (85.73 kg)  BMI 23.95 kg/m2  SpO2 97% Ideal Body Weight: Weight in (lb) to have BMI = 25: 196.9  Physical Exam  Constitutional: He is oriented to person, place, and time. He appears well-developed and well-nourished. No distress.  HENT:  Head: Normocephalic and atraumatic.  Eyes: Conjunctivae and EOM are normal. Pupils are equal, round, and reactive to light.  Cardiovascular: Normal rate.   Pulmonary/Chest: Effort normal. No respiratory distress.  Neurological: He is alert and oriented to person, place, and time.  Skin: Skin is  warm and dry. He is not diaphoretic.  Right 2nd toe with plantar ecchymosis along medial side.  No tenderness.  Normal rom and resisted strength.  Normal gait.    Psychiatric: He has a normal mood and affect. His behavior is normal.     Assessment and Plan: Andrew Wright is a 54 y.o. male who is here today for chief complaint of toe discoloration.  This appears like a bruise that is ultimately resolving quickly. Unlikely occlusion.  Alarming sx's instructed as well as written.   Advised icing the area.   rtc if sx's return.   Bruise of toe, right, initial encounter  Ivar Drape, PA-C [facility] 06/17/2015 7:18 PM

## 2015-06-17 NOTE — Patient Instructions (Signed)
This appears to be healing.  This could be a bruise that is clearing up.  You can ice it 3-4 times per day for 15 minutes.  Make sure there is a barrier between you and the ice.   If it turns dark again.  If you experience increased pain, swelling, loss of sensation--you need to return to the clinic or your doctor.

## 2015-06-17 NOTE — Telephone Encounter (Signed)
[facility]  Patient Name: Andrew Wright  DOB: [birth year]    Initial Comment Caller states they are having pain in his toe and the underside of his toe is blue.   Nurse Assessment  Nurse: Genoveva Ill, RN, Lattie Haw Date/Time (Eastern Time): 06/17/2015 4:40:57 PM  Confirm and document reason for call. If symptomatic, describe symptoms. ---caller states has pain in second toe of right foot and is bluish underneath; no recent injury; he rubbed toe and it became more pink, but still bluish tinged  Has the patient traveled out of the country within the last 30 days? ---No  Does the patient have any new or worsening symptoms? ---Yes  Will a triage be completed? ---Yes  Related visit to physician within the last 2 weeks? ---No  Does the PT have any chronic conditions? (i.e. diabetes, asthma, etc.) ---No     Guidelines    Guideline Title Affirmed Question Affirmed Notes  Toe Pain Toe pain (all triage questions negative)    Final Disposition User   See Physician within 4 Hours (or PCP triage) Genoveva Ill, RN, Lattie Haw    Referrals  [facility]   Disagree/Comply: Comply

## 2015-08-11 ENCOUNTER — Encounter: Payer: Self-pay | Admitting: Internal Medicine

## 2015-08-11 ENCOUNTER — Ambulatory Visit (INDEPENDENT_AMBULATORY_CARE_PROVIDER_SITE_OTHER): Payer: BLUE CROSS/BLUE SHIELD | Admitting: Internal Medicine

## 2015-08-11 VITALS — BP 110/80 | HR 86 | Temp 98.4°F | Resp 16 | Ht 74.5 in | Wt 190.0 lb

## 2015-08-11 DIAGNOSIS — J0101 Acute recurrent maxillary sinusitis: Secondary | ICD-10-CM | POA: Diagnosis not present

## 2015-08-11 DIAGNOSIS — J0100 Acute maxillary sinusitis, unspecified: Secondary | ICD-10-CM | POA: Diagnosis not present

## 2015-08-11 DIAGNOSIS — E781 Pure hyperglyceridemia: Secondary | ICD-10-CM | POA: Diagnosis not present

## 2015-08-11 MED ORDER — AMOXICILLIN 875 MG PO TABS: 875.0000 mg | ORAL_TABLET | Freq: Two times a day (BID) | ORAL | Status: AC

## 2015-08-11 MED ORDER — FENOFIBRATE 160 MG PO TABS: 160.0000 mg | ORAL_TABLET | Freq: Every day | ORAL | Status: DC

## 2015-08-11 MED ORDER — PHENYLEPH-PROMETHAZINE-COD 5-6.25-10 MG/5ML PO SYRP: 5.0000 mL | ORAL_SOLUTION | Freq: Four times a day (QID) | ORAL | Status: DC | PRN

## 2015-08-11 NOTE — Progress Notes (Signed)
Subjective:  Patient ID: Andrew Wright, male    DOB: [birth year]  Age: 54 y.o. MRN: [id number]  CC: Hyperlipidemia and Sinusitis   HPI Andrew Wright presents for a 4 day history of nonproductive cough, sore throat, thick yellow/bloody nasal phlegm, night sweats, and fatigue. He also complains that omega-3 acid fish oils have caused stomach upset and he doesn't want to take them anymore.  Outpatient Prescriptions Prior to Visit  Medication Sig Dispense Refill  . latanoprost (XALATAN) 0.005 % ophthalmic solution Place 1 drop into both eyes at bedtime.    . fluticasone (FLONASE) 50 MCG/ACT nasal spray Place 2 sprays into both nostrils daily. 16 g 1  . omega-3 acid ethyl esters (LOVAZA) 1 G capsule Take 2 capsules (2 g total) by mouth 2 (two) times daily. (Patient not taking: Reported on 08/11/2015) 120 capsule 11  . Phenyleph-Promethazine-Cod 5-6.25-10 MG/5ML SYRP Take 5 mLs by mouth 4 (four) times daily as needed. (Patient not taking: Reported on 06/17/2015) 180 mL 0   No facility-administered medications prior to visit.    ROS Review of Systems  Constitutional: Negative.  Negative for fever, chills, diaphoresis, appetite change and fatigue.  HENT: Positive for congestion, nosebleeds, postnasal drip, rhinorrhea, sinus pressure and sore throat. Negative for facial swelling and trouble swallowing.   Eyes: Negative.   Respiratory: Positive for cough. Negative for choking, chest tightness, shortness of breath, wheezing and stridor.   Cardiovascular: Negative.  Negative for chest pain, palpitations and leg swelling.  Gastrointestinal: Negative.  Negative for nausea, vomiting, abdominal pain, diarrhea, constipation and blood in stool.  Endocrine: Negative.   Genitourinary: Negative.   Musculoskeletal: Negative.   Skin: Negative.  Negative for color change and rash.  Allergic/Immunologic: Negative.   Neurological: Negative.  Negative for dizziness, tremors, syncope and light-headedness.    Hematological: Negative.  Negative for adenopathy. Does not bruise/bleed easily.  Psychiatric/Behavioral: Negative.     Objective:  BP 110/80 mmHg  Pulse 86  Temp(Src) 98.4 F (36.9 C) (Oral)  Resp 16  Ht 6' 2.5" (1.892 m)  Wt 190 lb (86.183 kg)  BMI 24.08 kg/m2  SpO2 97%  BP Readings from Last 3 Encounters:  08/11/15 110/80  06/17/15 140/90  05/25/15 130/78    Wt Readings from Last 3 Encounters:  08/11/15 190 lb (86.183 kg)  06/17/15 189 lb (85.73 kg)  05/25/15 190 lb (86.183 kg)    Physical Exam  Constitutional: He is oriented to person, place, and time. He appears well-developed and well-nourished.  Non-toxic appearance. He does not have a sickly appearance. He does not appear ill. No distress.  HENT:  Right Ear: Hearing, tympanic membrane, external ear and ear canal normal.  Left Ear: Hearing, tympanic membrane, external ear and ear canal normal.  Nose: Mucosal edema and rhinorrhea present. Right sinus exhibits maxillary sinus tenderness. Right sinus exhibits no frontal sinus tenderness. Left sinus exhibits no maxillary sinus tenderness and no frontal sinus tenderness.  Mouth/Throat: Mucous membranes are normal. Mucous membranes are not pale, not dry and not cyanotic. No oral lesions. No trismus in the jaw. No uvula swelling. Posterior oropharyngeal erythema present. No oropharyngeal exudate, posterior oropharyngeal edema or tonsillar abscesses.  Eyes: Conjunctivae are normal. Right eye exhibits no discharge. Left eye exhibits no discharge. No scleral icterus.  Neck: Normal range of motion. Neck supple. No JVD present. No tracheal deviation present. No thyromegaly present.  Cardiovascular: Normal rate, regular rhythm, normal heart sounds and intact distal pulses.  Exam reveals no gallop and no friction  rub.   No murmur heard. Pulmonary/Chest: Effort normal and breath sounds normal. No stridor. No respiratory distress. He has no wheezes. He has no rales. He exhibits no  tenderness.  Abdominal: Soft. Bowel sounds are normal. He exhibits no distension and no mass. There is no tenderness. There is no rebound and no guarding.  Musculoskeletal: Normal range of motion. He exhibits no edema or tenderness.  Lymphadenopathy:    He has no cervical adenopathy.  Neurological: He is oriented to person, place, and time.  Skin: Skin is warm and dry. No rash noted. He is not diaphoretic. No erythema. No pallor.  Vitals reviewed.   Lab Results  Component Value Date   WBC 4.8 11/25/2014   HGB 15.4 11/25/2014   HCT 43.7 11/25/2014   PLT 221.0 11/25/2014   GLUCOSE 87 11/25/2014   CHOL 192 11/25/2014   TRIG * 11/25/2014    442.0 Triglyceride is over 400; calculations on Lipids are invalid.   HDL 33.20* 11/25/2014   LDLDIRECT 79.0 11/25/2014   ALT 46 11/25/2014   AST 27 11/25/2014   NA 141 11/25/2014   K 4.0 11/25/2014   CL 104 11/25/2014   CREATININE 0.96 11/25/2014   BUN 12 11/25/2014   CO2 31 11/25/2014   TSH 1.93 11/25/2014   PSA 3.97 11/25/2014    Patient was never admitted.  Assessment & Plan:   Andrew Wright was seen today for hyperlipidemia and sinusitis.  Diagnoses and all orders for this visit:  Acute recurrent maxillary sinusitis -     amoxicillin (AMOXIL) 875 MG tablet; Take 1 tablet (875 mg total) by mouth 2 (two) times daily. -     Phenyleph-Promethazine-Cod 5-6.25-10 MG/5ML SYRP; Take 5 mLs by mouth 4 (four) times daily as needed.  Hypertriglyceridemia- he has discontinued the omega-3 acid fish oils, will treat the triglycerides with fenofibrate -     fenofibrate 160 MG tablet; Take 1 tablet (160 mg total) by mouth daily.  Acute maxillary sinusitis, recurrence not specified -     Phenyleph-Promethazine-Cod 5-6.25-10 MG/5ML SYRP; Take 5 mLs by mouth 4 (four) times daily as needed.   I have discontinued [name] fluticasone and omega-3 acid ethyl esters. I am also having him start on fenofibrate and amoxicillin. Additionally, I am having  him maintain his latanoprost and Phenyleph-Promethazine-Cod.  Meds ordered this encounter  Medications  . fenofibrate 160 MG tablet    Sig: Take 1 tablet (160 mg total) by mouth daily.    Dispense:  90 tablet    Refill:  3  . amoxicillin (AMOXIL) 875 MG tablet    Sig: Take 1 tablet (875 mg total) by mouth 2 (two) times daily.    Dispense:  20 tablet    Refill:  0  . Phenyleph-Promethazine-Cod 5-6.25-10 MG/5ML SYRP    Sig: Take 5 mLs by mouth 4 (four) times daily as needed.    Dispense:  180 mL    Refill:  0     Follow-up: Return in about 4 weeks (around 09/08/2015).  [name], MD

## 2015-08-11 NOTE — Patient Instructions (Signed)

## 2015-08-11 NOTE — Progress Notes (Signed)
Pre visit review using our clinic review tool, if applicable. No additional management support is needed unless otherwise documented below in the visit note. 

## 2015-09-18 ENCOUNTER — Encounter (HOSPITAL_COMMUNITY): Payer: Self-pay | Admitting: *Deleted

## 2015-09-18 ENCOUNTER — Emergency Department (HOSPITAL_COMMUNITY): Payer: BLUE CROSS/BLUE SHIELD

## 2015-09-18 ENCOUNTER — Emergency Department (HOSPITAL_COMMUNITY)
Admission: EM | Admit: 2015-09-18 | Discharge: 2015-09-18 | Disposition: A | Discharge status: Home / Self Care | Payer: BLUE CROSS/BLUE SHIELD | Attending: Emergency Medicine | Admitting: Emergency Medicine

## 2015-09-18 DIAGNOSIS — Z8639 Personal history of other endocrine, nutritional and metabolic disease: Secondary | ICD-10-CM | POA: Diagnosis not present

## 2015-09-18 DIAGNOSIS — Z8679 Personal history of other diseases of the circulatory system: Secondary | ICD-10-CM | POA: Insufficient documentation

## 2015-09-18 DIAGNOSIS — Z79899 Other long term (current) drug therapy: Secondary | ICD-10-CM | POA: Insufficient documentation

## 2015-09-18 DIAGNOSIS — Z880 Allergy status to penicillin: Secondary | ICD-10-CM | POA: Diagnosis not present

## 2015-09-18 DIAGNOSIS — R079 Chest pain, unspecified: Secondary | ICD-10-CM | POA: Diagnosis present

## 2015-09-18 LAB — CBC
HEMATOCRIT: 44.7 % (ref 39.0–52.0)
HEMOGLOBIN: 15.1 g/dL (ref 13.0–17.0)
MCH: 31.5 pg (ref 26.0–34.0)
MCHC: 33.8 g/dL (ref 30.0–36.0)
MCV: 93.3 fL (ref 78.0–100.0)
Platelets: 243 10*3/uL (ref 150–400)
RBC: 4.79 MIL/uL (ref 4.22–5.81)
RDW: 12.2 % (ref 11.5–15.5)
WBC: 8.3 10*3/uL (ref 4.0–10.5)

## 2015-09-18 LAB — I-STAT TROPONIN, ED: Troponin i, poc: 0 ng/mL (ref 0.00–0.08)

## 2015-09-18 LAB — D-DIMER, QUANTITATIVE: D-Dimer, Quant: 0.41 ug/mL-FEU (ref 0.00–0.50)

## 2015-09-18 LAB — BASIC METABOLIC PANEL
ANION GAP: 10 (ref 5–15)
BUN: 18 mg/dL (ref 6–20)
CO2: 27 mmol/L (ref 22–32)
Calcium: 9.5 mg/dL (ref 8.9–10.3)
Chloride: 103 mmol/L (ref 101–111)
Creatinine, Ser: 1.12 mg/dL (ref 0.61–1.24)
Glucose, Bld: 110 mg/dL — ABNORMAL HIGH (ref 65–99)
POTASSIUM: 3.8 mmol/L (ref 3.5–5.1)
SODIUM: 140 mmol/L (ref 135–145)

## 2015-09-18 LAB — CK: CK TOTAL: 151 U/L (ref 49–397)

## 2015-09-18 MED ORDER — IBUPROFEN 800 MG PO TABS: 800.0000 mg | ORAL_TABLET | Freq: Three times a day (TID) | ORAL | Status: DC

## 2015-09-18 NOTE — ED Provider Notes (Signed)
CSN: [id number]     Arrival date & time 09/18/15  1849 History   First MD Initiated Contact with Patient 09/18/15 2034     Chief Complaint  Patient presents with  . Chest Pain     (Consider location/radiation/quality/duration/timing/severity/associated sxs/prior Treatment) HPI  The patient is a 55 year old male, he has a history of no cardiac disease, he does have some family history of heart disease, he states that over the last couple of days he has had some sharp chest pain that radiates to the right shoulder, this seems to come on with deep breathing and with certain positions including laying down on his right side or his left side. He reports that over the last 3 days he has had some increased exercise doing pushups every day, he denies any exertional symptoms and states that he goes to the gym frequently, no pain with running on the treadmill or using the stationary bicycle. He denies coughing or fevers but states he felt like he may have had chills. He he does state that he has very paranoid about medical problems since he has heart disease runs in his family. He denies swelling of his legs, history of cancer himself, does endorse a recent plane trip up to the [place] to visit family. He also reports that he was sick with an upper respiratory infection of the last 2 months.  Past Medical History  Diagnosis Date  . Migraine   . Hypogonadism male 2010   History reviewed. No pertinent past surgical history. Family History  Problem Relation Age of Onset  . Arthritis Other   . Cancer Other     Prostate, Breast,Overian cancer  . Stroke Other    Social History  Substance Use Topics  . Smoking status: Never Smoker   . Smokeless tobacco: Never Used  . Alcohol Use: No    Review of Systems  All other systems reviewed and are negative.     Allergies  Lovaza and Penicillins  Home Medications   Prior to Admission medications   Medication Sig Start Date End Date Taking?  Authorizing Provider  fenofibrate 160 MG tablet Take 1 tablet (160 mg total) by mouth daily. 08/11/15  Yes Janith Lima, MD  latanoprost (XALATAN) 0.005 % ophthalmic solution Place 1 drop into both eyes at bedtime.   Yes Historical Provider, MD  ibuprofen (ADVIL,MOTRIN) 800 MG tablet Take 1 tablet (800 mg total) by mouth 3 (three) times daily. 09/18/15   Noemi Chapel, MD  Phenyleph-Promethazine-Cod 5-6.25-10 MG/5ML SYRP Take 5 mLs by mouth 4 (four) times daily as needed. 08/11/15   Janith Lima, MD   BP 155/89 mmHg  Pulse 98  Temp(Src) 98.3 F (36.8 C) (Oral)  Resp 18  SpO2 99% Physical Exam  Constitutional: He appears well-developed and well-nourished. No distress.  HENT:  Head: Normocephalic and atraumatic.  Mouth/Throat: Oropharynx is clear and moist. No oropharyngeal exudate.  Eyes: Conjunctivae and EOM are normal. Pupils are equal, round, and reactive to light. Right eye exhibits no discharge. Left eye exhibits no discharge. No scleral icterus.  Neck: Normal range of motion. Neck supple. No JVD present. No thyromegaly present.  Cardiovascular: Normal rate, regular rhythm, normal heart sounds and intact distal pulses.  Exam reveals no gallop and no friction rub.   No murmur heard. Pulmonary/Chest: Effort normal and breath sounds normal. No respiratory distress. He has no wheezes. He has no rales.  Abdominal: Soft. Bowel sounds are normal. He exhibits no distension and no mass. There  is no tenderness.  Musculoskeletal: Normal range of motion. He exhibits no edema or tenderness.  Lymphadenopathy:    He has no cervical adenopathy.  Neurological: He is alert. Coordination normal.  Skin: Skin is warm and dry. No rash noted. No erythema.  Psychiatric: He has a normal mood and affect. His behavior is normal.  Nursing note and vitals reviewed.   ED Course  Procedures (including critical care time) Labs Review Labs Reviewed  BASIC METABOLIC PANEL - Abnormal; Notable for the  following:    Glucose, Bld 110 (*)    All other components within normal limits  CBC  CK  D-DIMER, QUANTITATIVE (NOT AT [facility])  Randolm Idol, ED    Imaging Review Dg Chest 2 View  09/18/2015  CLINICAL DATA:  Chest and neck pain after doing pushups 2 days ago. EXAM: CHEST  2 VIEW COMPARISON:  None. FINDINGS: The cardiac silhouette, mediastinal and hilar contours are normal. The lungs are clear. No pleural effusion or pneumothorax. The bony thorax is intact. IMPRESSION: No acute cardiopulmonary findings. Electronically Signed   By: Marijo Sanes M.D.   On: 09/18/2015 19:35   I have personally reviewed and evaluated these images and lab results as part of my medical decision-making.   EKG Interpretation   Date/Time:  Sunday September 18 2015 19:18:33 EST Ventricular Rate:  94 PR Interval:  147 QRS Duration: 86 QT Interval:  335 QTC Calculation: 419 R Axis:   63 Text Interpretation:  Sinus rhythm Consider left ventricular hypertrophy  No old tracing to compare Confirmed by Tatem Holsonback  MD, [place] ([id number]) on  09/18/2015 8:36:34 PM      MDM   Final diagnoses:  Chest pain, unspecified chest pain type    There is no reproducible tenderness to palpation over the chest. I have reviewed the EKG and the chest x-ray, there are no acute findings. Will add d-dimer that the patient likely has pleurisy. Patient is in agreement with the plan.  Xray and labs including Trop and D dimer nebg - VS with mild hypetension - can f/u as outpt - pleurisy likely - will d/c.  In addition to written d/c instructions, the pt was given verbal d/c instructions including the indications for return and expressed understanding to the instructions.   Noemi Chapel, MD 09/18/15 2124

## 2015-09-18 NOTE — ED Notes (Addendum)
Pt reports R cp that radiates to his R shoulder.  Worse with inspiration and with certain position.  Pt denies SOB or lightheadedness at this time.  Pt reports feeling tense and "hunched over."  Pt is A&Ox 4.  Pt reports he started to do push-ups x 2 days ago as well.

## 2015-09-18 NOTE — Discharge Instructions (Signed)

## 2015-09-20 ENCOUNTER — Ambulatory Visit (INDEPENDENT_AMBULATORY_CARE_PROVIDER_SITE_OTHER): Payer: BLUE CROSS/BLUE SHIELD | Admitting: Internal Medicine

## 2015-09-20 ENCOUNTER — Encounter: Payer: Self-pay | Admitting: Internal Medicine

## 2015-09-20 VITALS — BP 138/82 | HR 80 | Temp 98.7°F | Resp 20 | Wt 190.0 lb

## 2015-09-20 DIAGNOSIS — R079 Chest pain, unspecified: Secondary | ICD-10-CM

## 2015-09-20 NOTE — Patient Instructions (Signed)
Please continue all other medications as before, and refills have been done if requested.  Please have the pharmacy call with any other refills you may need.  Please keep your appointments with your specialists as you may have planned  Please consider having "rheumatology" blood work done such as ANA blood testing, or seeing Dr Smith/sports medicine in this office if you do not improve

## 2015-09-20 NOTE — Progress Notes (Signed)
Subjective:    Patient ID: Andrew Wright, male    DOB: 1960-09-26, 55 y.o.   MRN: [id number]  HPI  Here to f/u ED visit with right sided CP,  some sharp chest pain that radiates to the right shoulder, this seems to come on with deep breathing and with certain positions including laying down on his right side or his left side. He reports that over the last 3 days he has had some increased exercise doing pushups every day, he denies any exertional symptoms and states that he goes to the gym frequently, no pain with running on the treadmill or using the stationary bicycle. He denies coughing or fevers but states he felt like he may have had chills. He he does state that he has very paranoid about medical problems since he has heart disease runs in his family.  Still having pain since ED visit but decreased with nsaid prn.  Now recalls he did start a new exercise of pushups before onset of pain..  No hx of rheum dz, though has FH of Lupus.  No fever, ST, cough Past Medical History  Diagnosis Date  . Migraine   . Hypogonadism male 2010   No past surgical history on file.  reports that he has never smoked. He has never used smokeless tobacco. He reports that he does not drink alcohol or use illicit drugs. family history includes Arthritis in his other; Cancer in his other; Stroke in his other. Allergies  Allergen Reactions  . Lovaza [Omega-3-Acid Ethyl Esters] Other (See Comments)    GI upset  . Penicillins Nausea Only    Has patient had a PCN reaction causing immediate rash, facial/tongue/throat swelling, SOB or lightheadedness with hypotension: No Has patient had a PCN reaction causing severe rash involving mucus membranes or skin necrosis: No Has patient had a PCN reaction that required hospitalization No Has patient had a PCN reaction occurring within the last 10 years: No If all of the above answers are "NO", then may proceed with Cephalosporin use.    Current Outpatient Prescriptions on  File Prior to Visit  Medication Sig Dispense Refill  . fenofibrate 160 MG tablet Take 1 tablet (160 mg total) by mouth daily. 90 tablet 3  . ibuprofen (ADVIL,MOTRIN) 800 MG tablet Take 1 tablet (800 mg total) by mouth 3 (three) times daily. 21 tablet 0  . latanoprost (XALATAN) 0.005 % ophthalmic solution Place 1 drop into both eyes at bedtime.    Marland Kitchen Phenyleph-Promethazine-Cod 5-6.25-10 MG/5ML SYRP Take 5 mLs by mouth 4 (four) times daily as needed. 180 mL 0   No current facility-administered medications on file prior to visit.   Review of Systems  All otherwise neg per pt    Objective:   Physical Exam BP 138/82 mmHg  Pulse 80  Temp(Src) 98.7 F (37.1 C) (Oral)  Resp 20  Wt 190 lb (86.183 kg)  SpO2 97% VS noted, not ill appearing Constitutional: Pt appears in no significant distress HENT: Head: NCAT.  Right Ear: External ear normal.  Left Ear: External ear normal.  Eyes: . Pupils are equal, round, and reactive to light. Conjunctivae and EOM are normal Neck: Normal range of motion. Neck supple.  Cardiovascular: Normal rate and regular rhythm.   Pulmonary/Chest: Effort normal and breath sounds without rales or wheezing.  No chest wall tenderness Right shoulder FROM, NT Neurological: Pt is alert. Not confused , motor grossly intact Skin: Skin is warm. No rash, no LE edema Psychiatric: Pt behavior is  normal. No agitation. 1-2+ nervous    Assessment & Plan:

## 2015-09-20 NOTE — Progress Notes (Signed)
Pre visit review using our clinic review tool, if applicable. No additional management support is needed unless otherwise documented below in the visit note. 

## 2015-09-25 NOTE — Assessment & Plan Note (Signed)
Suspect MSK related to recent new excercises, exam benign, cant r/o rheum or pleurisy, to cont nsaid prn, tried to reassure pt today, consider rheum eval if persists, consider Dr Smith/sport med if persists or worsens,  to f/u any worsening symptoms or concerns

## 2015-11-29 DIAGNOSIS — D2312 Other benign neoplasm of skin of left eyelid, including canthus: Secondary | ICD-10-CM | POA: Diagnosis not present

## 2015-11-30 DIAGNOSIS — M546 Pain in thoracic spine: Secondary | ICD-10-CM | POA: Diagnosis not present

## 2015-11-30 DIAGNOSIS — M9903 Segmental and somatic dysfunction of lumbar region: Secondary | ICD-10-CM | POA: Diagnosis not present

## 2015-11-30 DIAGNOSIS — M542 Cervicalgia: Secondary | ICD-10-CM | POA: Diagnosis not present

## 2015-11-30 DIAGNOSIS — M545 Low back pain: Secondary | ICD-10-CM | POA: Diagnosis not present

## 2016-01-03 DIAGNOSIS — M542 Cervicalgia: Secondary | ICD-10-CM | POA: Diagnosis not present

## 2016-01-03 DIAGNOSIS — M9903 Segmental and somatic dysfunction of lumbar region: Secondary | ICD-10-CM | POA: Diagnosis not present

## 2016-01-03 DIAGNOSIS — M546 Pain in thoracic spine: Secondary | ICD-10-CM | POA: Diagnosis not present

## 2016-01-03 DIAGNOSIS — M545 Low back pain: Secondary | ICD-10-CM | POA: Diagnosis not present

## 2016-01-11 DIAGNOSIS — H401133 Primary open-angle glaucoma, bilateral, severe stage: Secondary | ICD-10-CM | POA: Diagnosis not present

## 2016-01-16 ENCOUNTER — Encounter: Payer: Self-pay | Admitting: Internal Medicine

## 2016-01-16 ENCOUNTER — Ambulatory Visit (INDEPENDENT_AMBULATORY_CARE_PROVIDER_SITE_OTHER): Payer: BLUE CROSS/BLUE SHIELD | Admitting: Internal Medicine

## 2016-01-16 ENCOUNTER — Other Ambulatory Visit (INDEPENDENT_AMBULATORY_CARE_PROVIDER_SITE_OTHER): Payer: BLUE CROSS/BLUE SHIELD

## 2016-01-16 VITALS — BP 130/86 | HR 77 | Temp 98.2°F | Resp 16 | Ht 74.5 in | Wt 190.0 lb

## 2016-01-16 DIAGNOSIS — R7989 Other specified abnormal findings of blood chemistry: Secondary | ICD-10-CM

## 2016-01-16 DIAGNOSIS — Z0000 Encounter for general adult medical examination without abnormal findings: Secondary | ICD-10-CM

## 2016-01-16 LAB — COMPREHENSIVE METABOLIC PANEL
ALT: 65 U/L — AB (ref 0–53)
AST: 39 U/L — AB (ref 0–37)
Albumin: 4.8 g/dL (ref 3.5–5.2)
Alkaline Phosphatase: 41 U/L (ref 39–117)
BILIRUBIN TOTAL: 0.5 mg/dL (ref 0.2–1.2)
BUN: 14 mg/dL (ref 6–23)
CHLORIDE: 104 meq/L (ref 96–112)
CO2: 30 meq/L (ref 19–32)
CREATININE: 1.08 mg/dL (ref 0.40–1.50)
Calcium: 10.1 mg/dL (ref 8.4–10.5)
GFR: 75.13 mL/min (ref >60.00)
GLUCOSE: 84 mg/dL (ref 70–99)
Potassium: 4.5 mEq/L (ref 3.5–5.1)
SODIUM: 141 meq/L (ref 135–145)
Total Protein: 6.7 g/dL (ref 6.0–8.3)

## 2016-01-16 LAB — FECAL OCCULT BLOOD, GUAIAC: Fecal Occult Blood: NEGATIVE

## 2016-01-16 LAB — URINALYSIS, ROUTINE W REFLEX MICROSCOPIC
BILIRUBIN URINE: NEGATIVE
HGB URINE DIPSTICK: NEGATIVE
Ketones, ur: NEGATIVE
Leukocytes, UA: NEGATIVE
NITRITE: NEGATIVE
Specific Gravity, Urine: 1.01 (ref 1.000–1.030)
TOTAL PROTEIN, URINE-UPE24: NEGATIVE
Urine Glucose: NEGATIVE
Urobilinogen, UA: 0.2 (ref 0.0–1.0)
WBC, UA: NONE SEEN (ref 0–?)
pH: 6.5 (ref 5.0–8.0)

## 2016-01-16 LAB — TSH: TSH: 1.42 u[IU]/mL (ref 0.35–4.50)

## 2016-01-16 LAB — CBC WITH DIFFERENTIAL/PLATELET
BASOS ABS: 0 10*3/uL (ref 0.0–0.1)
BASOS PCT: 0.5 % (ref 0.0–3.0)
Eosinophils Absolute: 0.1 10*3/uL (ref 0.0–0.7)
Eosinophils Relative: 2.2 % (ref 0.0–5.0)
HEMATOCRIT: 43.7 % (ref 39.0–52.0)
Hemoglobin: 15 g/dL (ref 13.0–17.0)
LYMPHS ABS: 1.5 10*3/uL (ref 0.7–4.0)
LYMPHS PCT: 28.5 % (ref 12.0–46.0)
MCHC: 34.3 g/dL (ref 30.0–36.0)
MCV: 90.3 fl (ref 78.0–100.0)
MONOS PCT: 8.3 % (ref 3.0–12.0)
Monocytes Absolute: 0.4 10*3/uL (ref 0.1–1.0)
NEUTROS ABS: 3.2 10*3/uL (ref 1.4–7.7)
NEUTROS PCT: 60.5 % (ref 43.0–77.0)
PLATELETS: 271 10*3/uL (ref 150.0–400.0)
RBC: 4.84 Mil/uL (ref 4.22–5.81)
RDW: 12.8 % (ref 11.5–15.5)
WBC: 5.4 10*3/uL (ref 4.0–10.5)

## 2016-01-16 LAB — LIPID PANEL
CHOL/HDL RATIO: 5
CHOLESTEROL: 202 mg/dL — AB (ref 0–200)
HDL: 37.9 mg/dL — AB (ref >39.00)
NonHDL: 164.09
Triglycerides: 222 mg/dL — ABNORMAL HIGH (ref 0.0–149.0)
VLDL: 44.4 mg/dL — AB (ref 0.0–40.0)

## 2016-01-16 LAB — PSA: PSA: 3.87 ng/mL (ref 0.10–4.00)

## 2016-01-16 LAB — LDL CHOLESTEROL, DIRECT: Direct LDL: 115 mg/dL

## 2016-01-16 NOTE — Patient Instructions (Signed)

## 2016-01-16 NOTE — Progress Notes (Signed)
Subjective:  Patient ID: Andrew Wright, male    DOB: [birth year]  Age: 55 y.o. MRN: [id number]  CC: Annual Exam   HPI Andrew Wright presents for a CPX - He feels well and offers no complaints.  Outpatient Prescriptions Prior to Visit  Medication Sig Dispense Refill  . fenofibrate 160 MG tablet Take 1 tablet (160 mg total) by mouth daily. 90 tablet 3  . ibuprofen (ADVIL,MOTRIN) 800 MG tablet Take 1 tablet (800 mg total) by mouth 3 (three) times daily. 21 tablet 0  . latanoprost (XALATAN) 0.005 % ophthalmic solution Place 1 drop into both eyes at bedtime.    Marland Kitchen Phenyleph-Promethazine-Cod 5-6.25-10 MG/5ML SYRP Take 5 mLs by mouth 4 (four) times daily as needed. 180 mL 0   No facility-administered medications prior to visit.    ROS Review of Systems  Constitutional: Negative.   HENT: Negative.   Eyes: Negative.   Respiratory: Negative.  Negative for cough, choking, chest tightness, shortness of breath and stridor.   Cardiovascular: Negative.  Negative for chest pain, palpitations and leg swelling.  Gastrointestinal: Negative.  Negative for nausea, vomiting, abdominal pain and diarrhea.  Endocrine: Negative.   Genitourinary: Negative.  Negative for dysuria, urgency, hematuria, flank pain, scrotal swelling, difficulty urinating and testicular pain.  Musculoskeletal: Negative.   Skin: Negative.   Allergic/Immunologic: Negative.   Neurological: Negative.   Hematological: Negative.  Negative for adenopathy. Does not bruise/bleed easily.  Psychiatric/Behavioral: Negative.     Objective:  BP 130/86 mmHg  Pulse 77  Temp(Src) 98.2 F (36.8 C) (Oral)  Resp 16  Ht 6' 2.5" (1.892 m)  Wt 190 lb (86.183 kg)  BMI 24.08 kg/m2  SpO2 97%  BP Readings from Last 3 Encounters:  01/16/16 130/86  09/20/15 138/82  09/18/15 138/87    Wt Readings from Last 3 Encounters:  01/16/16 190 lb (86.183 kg)  09/20/15 190 lb (86.183 kg)  08/11/15 190 lb (86.183 kg)    Physical Exam    Constitutional: He is oriented to person, place, and time. He appears well-developed and well-nourished. No distress.  HENT:  Head: Normocephalic and atraumatic.  Mouth/Throat: Oropharynx is clear and moist. No oropharyngeal exudate.  Eyes: Conjunctivae are normal. Right eye exhibits no discharge. Left eye exhibits no discharge. No scleral icterus.  Neck: Normal range of motion. Neck supple. No JVD present. No tracheal deviation present. No thyromegaly present.  Cardiovascular: Normal rate, regular rhythm, normal heart sounds and intact distal pulses.  Exam reveals no gallop and no friction rub.   No murmur heard. Pulmonary/Chest: Effort normal and breath sounds normal. No stridor. No respiratory distress. He has no wheezes. He has no rales. He exhibits no tenderness.  Abdominal: Soft. Bowel sounds are normal. He exhibits no distension and no mass. There is no tenderness. There is no rebound and no guarding. Hernia confirmed negative in the right inguinal area and confirmed negative in the left inguinal area.  Genitourinary: Rectum normal, testes normal and penis normal. Rectal exam shows no external hemorrhoid, no internal hemorrhoid, no fissure, no mass, no tenderness and anal tone normal. Guaiac negative stool. Prostate is enlarged (1+ smooth symm BPH, no nodules). Prostate is not tender. Right testis shows no mass, no swelling and no tenderness. Right testis is descended. Left testis shows no mass, no swelling and no tenderness. Left testis is descended. Circumcised. No penile erythema or penile tenderness. No discharge found.  Musculoskeletal: Normal range of motion. He exhibits no edema or tenderness.  Lymphadenopathy:  He has no cervical adenopathy.       Right: No inguinal adenopathy present.       Left: No inguinal adenopathy present.  Neurological: He is oriented to person, place, and time.  Skin: Skin is warm and dry. No rash noted. He is not diaphoretic. No erythema. No pallor.   Psychiatric: He has a normal mood and affect. His behavior is normal. Judgment and thought content normal.  Vitals reviewed.   Lab Results  Component Value Date   WBC 5.4 01/16/2016   HGB 15.0 01/16/2016   HCT 43.7 01/16/2016   PLT 271.0 01/16/2016   GLUCOSE 84 01/16/2016   CHOL 202* 01/16/2016   TRIG 222.0* 01/16/2016   HDL 37.90* 01/16/2016   LDLDIRECT 115.0 01/16/2016   ALT 65* 01/16/2016   AST 39* 01/16/2016   NA 141 01/16/2016   K 4.5 01/16/2016   CL 104 01/16/2016   CREATININE 1.08 01/16/2016   BUN 14 01/16/2016   CO2 30 01/16/2016   TSH 1.42 01/16/2016   PSA 3.87 01/16/2016    Dg Chest 2 View  09/18/2015  CLINICAL DATA:  Chest and neck pain after doing pushups 2 days ago. EXAM: CHEST  2 VIEW COMPARISON:  None. FINDINGS: The cardiac silhouette, mediastinal and hilar contours are normal. The lungs are clear. No pleural effusion or pneumothorax. The bony thorax is intact. IMPRESSION: No acute cardiopulmonary findings. Electronically Signed   By: Marijo Sanes M.D.   On: 09/18/2015 19:35    Assessment & Plan:   Tin was seen today for annual exam.  Diagnoses and all orders for this visit:  Routine general medical examination at a health care facility- exam completed, labs ordered and reviewed, vaccines reviewed and updated, his colonoscopy is up-to-date, patient education material was given. -     Lipid panel; Future -     PSA; Future -     TSH; Future -     Urinalysis, Routine w reflex microscopic (not at [facility]); Future -     Comprehensive metabolic panel; Future -     CBC with Differential/Platelet; Future -     Hepatitis C antibody; Future -     HIV antibody; Future  I have discontinued Mr. Cowsert Phenyleph-Promethazine-Cod. I am also having him maintain his latanoprost, fenofibrate, and ibuprofen.  No orders of the defined types were placed in this encounter.     Follow-up: Return if symptoms worsen or fail to improve.  [name], MD

## 2016-01-16 NOTE — Progress Notes (Signed)
Pre visit review using our clinic review tool, if applicable. No additional management support is needed unless otherwise documented below in the visit note. 

## 2016-01-17 ENCOUNTER — Encounter: Payer: Self-pay | Admitting: Internal Medicine

## 2016-01-17 LAB — HIV ANTIBODY (ROUTINE TESTING W REFLEX): HIV: NONREACTIVE

## 2016-01-17 LAB — HEPATITIS C ANTIBODY: HCV Ab: NEGATIVE

## 2016-02-06 DIAGNOSIS — M545 Low back pain: Secondary | ICD-10-CM | POA: Diagnosis not present

## 2016-02-06 DIAGNOSIS — M546 Pain in thoracic spine: Secondary | ICD-10-CM | POA: Diagnosis not present

## 2016-02-06 DIAGNOSIS — M542 Cervicalgia: Secondary | ICD-10-CM | POA: Diagnosis not present

## 2016-02-06 DIAGNOSIS — M9903 Segmental and somatic dysfunction of lumbar region: Secondary | ICD-10-CM | POA: Diagnosis not present

## 2016-03-05 DIAGNOSIS — M542 Cervicalgia: Secondary | ICD-10-CM | POA: Diagnosis not present

## 2016-03-05 DIAGNOSIS — M9903 Segmental and somatic dysfunction of lumbar region: Secondary | ICD-10-CM | POA: Diagnosis not present

## 2016-03-05 DIAGNOSIS — M546 Pain in thoracic spine: Secondary | ICD-10-CM | POA: Diagnosis not present

## 2016-03-05 DIAGNOSIS — M545 Low back pain: Secondary | ICD-10-CM | POA: Diagnosis not present

## 2016-03-12 DIAGNOSIS — M545 Low back pain: Secondary | ICD-10-CM | POA: Diagnosis not present

## 2016-03-12 DIAGNOSIS — M9903 Segmental and somatic dysfunction of lumbar region: Secondary | ICD-10-CM | POA: Diagnosis not present

## 2016-03-12 DIAGNOSIS — M546 Pain in thoracic spine: Secondary | ICD-10-CM | POA: Diagnosis not present

## 2016-03-12 DIAGNOSIS — M542 Cervicalgia: Secondary | ICD-10-CM | POA: Diagnosis not present

## 2016-03-19 DIAGNOSIS — M545 Low back pain: Secondary | ICD-10-CM | POA: Diagnosis not present

## 2016-03-19 DIAGNOSIS — M9903 Segmental and somatic dysfunction of lumbar region: Secondary | ICD-10-CM | POA: Diagnosis not present

## 2016-03-19 DIAGNOSIS — M542 Cervicalgia: Secondary | ICD-10-CM | POA: Diagnosis not present

## 2016-03-19 DIAGNOSIS — M546 Pain in thoracic spine: Secondary | ICD-10-CM | POA: Diagnosis not present

## 2016-03-29 DIAGNOSIS — M546 Pain in thoracic spine: Secondary | ICD-10-CM | POA: Diagnosis not present

## 2016-03-29 DIAGNOSIS — M545 Low back pain: Secondary | ICD-10-CM | POA: Diagnosis not present

## 2016-03-29 DIAGNOSIS — M9903 Segmental and somatic dysfunction of lumbar region: Secondary | ICD-10-CM | POA: Diagnosis not present

## 2016-03-29 DIAGNOSIS — M542 Cervicalgia: Secondary | ICD-10-CM | POA: Diagnosis not present

## 2016-04-19 DIAGNOSIS — M546 Pain in thoracic spine: Secondary | ICD-10-CM | POA: Diagnosis not present

## 2016-04-19 DIAGNOSIS — M9903 Segmental and somatic dysfunction of lumbar region: Secondary | ICD-10-CM | POA: Diagnosis not present

## 2016-04-19 DIAGNOSIS — M542 Cervicalgia: Secondary | ICD-10-CM | POA: Diagnosis not present

## 2016-04-19 DIAGNOSIS — M545 Low back pain: Secondary | ICD-10-CM | POA: Diagnosis not present

## 2016-05-17 ENCOUNTER — Ambulatory Visit (INDEPENDENT_AMBULATORY_CARE_PROVIDER_SITE_OTHER): Payer: BLUE CROSS/BLUE SHIELD | Admitting: Internal Medicine

## 2016-05-17 ENCOUNTER — Encounter: Payer: Self-pay | Admitting: Internal Medicine

## 2016-05-17 VITALS — BP 132/90 | HR 79 | Temp 99.1°F | Wt 190.0 lb

## 2016-05-17 DIAGNOSIS — M542 Cervicalgia: Secondary | ICD-10-CM | POA: Diagnosis not present

## 2016-05-17 DIAGNOSIS — M546 Pain in thoracic spine: Secondary | ICD-10-CM | POA: Diagnosis not present

## 2016-05-17 DIAGNOSIS — J019 Acute sinusitis, unspecified: Secondary | ICD-10-CM

## 2016-05-17 DIAGNOSIS — M545 Low back pain: Secondary | ICD-10-CM | POA: Diagnosis not present

## 2016-05-17 DIAGNOSIS — M9903 Segmental and somatic dysfunction of lumbar region: Secondary | ICD-10-CM | POA: Diagnosis not present

## 2016-05-17 MED ORDER — LEVOFLOXACIN 500 MG PO TABS
500.0000 mg | ORAL_TABLET | Freq: Every day | ORAL | 0 refills | Status: AC
Start: 2016-05-17 — End: 2016-05-27

## 2016-05-17 MED ORDER — METHYLPREDNISOLONE ACETATE 80 MG/ML IJ SUSP
80.0000 mg | Freq: Once | INTRAMUSCULAR | Status: AC
  Administered 2016-05-17: 16:00:00 via INTRAMUSCULAR

## 2016-05-17 NOTE — Patient Instructions (Addendum)
Please take all new medication as prescribed - the antibiotic  You can also take Delsym OTC for cough, and/or Mucinex (or it's generic off brand) for congestion, and tylenol as needed for pain.  Please continue all other medications as before, and refills have been done if requested.  Please have the pharmacy call with any other refills you may need.  Please keep your appointments with your specialists as you may have planned

## 2016-05-17 NOTE — Progress Notes (Signed)
Pre visit review using our clinic review tool, if applicable. No additional management support is needed unless otherwise documented below in the visit note. 

## 2016-05-17 NOTE — Progress Notes (Signed)
   Subjective:    Patient ID: Andrew Wright, male    DOB: 1960-11-19, 55 y.o.   MRN: [id number]  HPI  Here with 2-3 days acute onset fever, facial pain, pressure, headache, general weakness and malaise, and greenish d/c, with mild ST and cough, but pt denies chest pain, wheezing, increased sob or doe, orthopnea, PND, increased LE swelling, palpitations, dizziness or syncope. Past Medical History:  Diagnosis Date  . Hypogonadism male 2010  . Migraine    No past surgical history on file.  reports that he has never smoked. He has never used smokeless tobacco. He reports that he does not drink alcohol or use drugs. family history includes Arthritis in his other; Cancer in his other; Stroke in his other. Allergies  Allergen Reactions  . Lovaza [Omega-3-Acid Ethyl Esters] Other (See Comments)    GI upset  . Penicillins Nausea Only    Has patient had a PCN reaction causing immediate rash, facial/tongue/throat swelling, SOB or lightheadedness with hypotension: No Has patient had a PCN reaction causing severe rash involving mucus membranes or skin necrosis: No Has patient had a PCN reaction that required hospitalization No Has patient had a PCN reaction occurring within the last 10 years: No If all of the above answers are "NO", then may proceed with Cephalosporin use.    Current Outpatient Prescriptions on File Prior to Visit  Medication Sig Dispense Refill  . fenofibrate 160 MG tablet Take 1 tablet (160 mg total) by mouth daily. 90 tablet 3  . ibuprofen (ADVIL,MOTRIN) 800 MG tablet Take 1 tablet (800 mg total) by mouth 3 (three) times daily. 21 tablet 0  . latanoprost (XALATAN) 0.005 % ophthalmic solution Place 1 drop into both eyes at bedtime.     No current facility-administered medications on file prior to visit.    Review of Systems All otherwise neg per pt     Objective:   Physical Exam BP 132/90   Pulse 79   Temp 99.1 F (37.3 C) (Oral)   Wt 190 lb (86.2 kg)   SpO2 96%    BMI 24.07 kg/m  VS noted, mild ill Constitutional: Pt appears in no apparent distress HENT: Head: NCAT.  Right Ear: External ear normal.  Left Ear: External ear normal.  Eyes: . Pupils are equal, round, and reactive to light. Conjunctivae and EOM are normal Bilat tm's with mild erythema.  Max sinus areas mild tender.  Pharynx with mild erythema, no exudate Neck: Normal range of motion. Neck supple.  Cardiovascular: Normal rate and regular rhythm.   Pulmonary/Chest: Effort normal and breath sounds without rales or wheezing.  Neurological: Pt is alert. Not confused , motor grossly intact Skin: Skin is warm. No rash, no LE edema Psychiatric: Pt behavior is normal. No agitation.     Assessment & Plan:

## 2016-05-19 NOTE — Assessment & Plan Note (Signed)
Mild to mod, for antibx course,  to f/u any worsening symptoms or concerns 

## 2016-05-22 ENCOUNTER — Ambulatory Visit (INDEPENDENT_AMBULATORY_CARE_PROVIDER_SITE_OTHER): Payer: BLUE CROSS/BLUE SHIELD | Admitting: Physician Assistant

## 2016-05-22 VITALS — BP 120/82 | HR 66 | Temp 98.3°F | Resp 16

## 2016-05-22 DIAGNOSIS — S61212A Laceration without foreign body of right middle finger without damage to nail, initial encounter: Secondary | ICD-10-CM | POA: Diagnosis not present

## 2016-05-22 DIAGNOSIS — S61219A Laceration without foreign body of unspecified finger without damage to nail, initial encounter: Secondary | ICD-10-CM

## 2016-05-22 NOTE — Patient Instructions (Signed)
WOUND CARE  . Keep area clean and dry for 24 hours. Do not remove bandage, if applied. . After 24 hours, remove bandage and wash wound gently with mild soap and warm water. Reapply a new bandage after cleaning wound, if directed. . Continue daily cleansing with soap and water until stitches/staples are removed. . Do not apply any ointments or creams to the wound while stitches/staples are in place, as this may cause delayed healing. . Notify the office if you experience any of the following signs of infection: Swelling, redness, pus drainage, streaking, fever >101.0 F . Notify the office if you experience excessive bleeding that does not stop after 15-20 minutes of constant, firm pressure.

## 2016-05-22 NOTE — Progress Notes (Signed)
Pt brought back to triage area to assess right finger laceration

## 2016-05-22 NOTE — Progress Notes (Signed)
   05/22/2016 2:17 PM   DOB: Apr 08, 1961 / MRN: [id number]  SUBJECTIVE:  Andrew Wright is a 55 y.o. male presenting for laceration to the tip of the right middle finger.  This occurred this morning. He cut the finger on cat food can top.  Tetanus in the system is documented and current. No loss of strength or sensation distal the injury.   He is allergic to lovaza [omega-3-acid ethyl esters] and penicillins.   He  has a past medical history of Hypogonadism male (2010) and Migraine.    He  reports that he has never smoked. He has never used smokeless tobacco. He reports that he does not drink alcohol or use drugs. He  reports that he currently engages in sexual activity. The patient  has no past surgical history on file.  His family history includes Arthritis in his other; Cancer in his other; Stroke in his other.  ROS  Per HPI.  The problem list and medications were reviewed and updated by myself where necessary and exist elsewhere in the encounter.   OBJECTIVE:  BP 120/82 (BP Location: Left Arm, Cuff Size: Normal)   Pulse 66   Temp 98.3 F (36.8 C) (Oral)   Resp 16   SpO2 96%   Physical Exam  Cardiovascular: Normal rate.   Pulmonary/Chest: Effort normal.  Skin:  2.5 mm shallow laceration of the distal middle right finger.  No deep tissue involvement.     No results found for this or any previous visit (from the past 72 hour(s)).  No results found.  ASSESSMENT AND PLAN  Bodee was seen today for laceration.  Diagnoses and all orders for this visit:  Finger laceration, initial encounter: This is very mild.  Mupirocin dressing placed along with wound care instructions.  RTC as needed.     The patient is advised to call or return to clinic if he does not see an improvement in symptoms, or to seek the care of the closest emergency department if he worsens with the above plan.   Philis Fendt, MHS, PA-C [facility] 05/22/2016 2:17 PM

## 2016-06-05 DIAGNOSIS — H401133 Primary open-angle glaucoma, bilateral, severe stage: Secondary | ICD-10-CM | POA: Diagnosis not present

## 2016-06-14 DIAGNOSIS — M542 Cervicalgia: Secondary | ICD-10-CM | POA: Diagnosis not present

## 2016-06-14 DIAGNOSIS — M546 Pain in thoracic spine: Secondary | ICD-10-CM | POA: Diagnosis not present

## 2016-06-14 DIAGNOSIS — M9903 Segmental and somatic dysfunction of lumbar region: Secondary | ICD-10-CM | POA: Diagnosis not present

## 2016-06-14 DIAGNOSIS — M545 Low back pain: Secondary | ICD-10-CM | POA: Diagnosis not present

## 2016-07-03 DIAGNOSIS — M9903 Segmental and somatic dysfunction of lumbar region: Secondary | ICD-10-CM | POA: Diagnosis not present

## 2016-07-03 DIAGNOSIS — M545 Low back pain: Secondary | ICD-10-CM | POA: Diagnosis not present

## 2016-07-03 DIAGNOSIS — M546 Pain in thoracic spine: Secondary | ICD-10-CM | POA: Diagnosis not present

## 2016-07-03 DIAGNOSIS — M542 Cervicalgia: Secondary | ICD-10-CM | POA: Diagnosis not present

## 2016-07-23 DIAGNOSIS — M62838 Other muscle spasm: Secondary | ICD-10-CM | POA: Diagnosis not present

## 2016-07-23 DIAGNOSIS — M9902 Segmental and somatic dysfunction of thoracic region: Secondary | ICD-10-CM | POA: Diagnosis not present

## 2016-07-23 DIAGNOSIS — G44209 Tension-type headache, unspecified, not intractable: Secondary | ICD-10-CM | POA: Diagnosis not present

## 2016-07-23 DIAGNOSIS — M9901 Segmental and somatic dysfunction of cervical region: Secondary | ICD-10-CM | POA: Diagnosis not present

## 2016-07-25 DIAGNOSIS — M9901 Segmental and somatic dysfunction of cervical region: Secondary | ICD-10-CM | POA: Diagnosis not present

## 2016-07-25 DIAGNOSIS — M62838 Other muscle spasm: Secondary | ICD-10-CM | POA: Diagnosis not present

## 2016-07-25 DIAGNOSIS — G44209 Tension-type headache, unspecified, not intractable: Secondary | ICD-10-CM | POA: Diagnosis not present

## 2016-07-25 DIAGNOSIS — M9902 Segmental and somatic dysfunction of thoracic region: Secondary | ICD-10-CM | POA: Diagnosis not present

## 2016-08-15 ENCOUNTER — Other Ambulatory Visit: Payer: Self-pay | Admitting: *Deleted

## 2016-08-15 DIAGNOSIS — M9901 Segmental and somatic dysfunction of cervical region: Secondary | ICD-10-CM | POA: Diagnosis not present

## 2016-08-15 DIAGNOSIS — M62838 Other muscle spasm: Secondary | ICD-10-CM | POA: Diagnosis not present

## 2016-08-15 DIAGNOSIS — M9902 Segmental and somatic dysfunction of thoracic region: Secondary | ICD-10-CM | POA: Diagnosis not present

## 2016-08-15 DIAGNOSIS — M542 Cervicalgia: Secondary | ICD-10-CM | POA: Diagnosis not present

## 2016-08-15 DIAGNOSIS — E781 Pure hyperglyceridemia: Secondary | ICD-10-CM

## 2016-08-15 MED ORDER — FENOFIBRATE 160 MG PO TABS: 160.0000 mg | ORAL_TABLET | Freq: Every day | ORAL | 1 refills | Status: DC

## 2016-08-22 DIAGNOSIS — M9902 Segmental and somatic dysfunction of thoracic region: Secondary | ICD-10-CM | POA: Diagnosis not present

## 2016-08-22 DIAGNOSIS — M542 Cervicalgia: Secondary | ICD-10-CM | POA: Diagnosis not present

## 2016-08-22 DIAGNOSIS — M62838 Other muscle spasm: Secondary | ICD-10-CM | POA: Diagnosis not present

## 2016-08-22 DIAGNOSIS — M9901 Segmental and somatic dysfunction of cervical region: Secondary | ICD-10-CM | POA: Diagnosis not present

## 2016-08-28 DIAGNOSIS — M9902 Segmental and somatic dysfunction of thoracic region: Secondary | ICD-10-CM | POA: Diagnosis not present

## 2016-08-28 DIAGNOSIS — M9901 Segmental and somatic dysfunction of cervical region: Secondary | ICD-10-CM | POA: Diagnosis not present

## 2016-08-28 DIAGNOSIS — M62838 Other muscle spasm: Secondary | ICD-10-CM | POA: Diagnosis not present

## 2016-08-28 DIAGNOSIS — M542 Cervicalgia: Secondary | ICD-10-CM | POA: Diagnosis not present

## 2016-09-11 DIAGNOSIS — M9905 Segmental and somatic dysfunction of pelvic region: Secondary | ICD-10-CM | POA: Diagnosis not present

## 2016-09-11 DIAGNOSIS — M5416 Radiculopathy, lumbar region: Secondary | ICD-10-CM | POA: Diagnosis not present

## 2016-09-11 DIAGNOSIS — M9903 Segmental and somatic dysfunction of lumbar region: Secondary | ICD-10-CM | POA: Diagnosis not present

## 2016-09-11 DIAGNOSIS — M9904 Segmental and somatic dysfunction of sacral region: Secondary | ICD-10-CM | POA: Diagnosis not present

## 2016-09-18 DIAGNOSIS — H401133 Primary open-angle glaucoma, bilateral, severe stage: Secondary | ICD-10-CM | POA: Diagnosis not present

## 2016-09-27 ENCOUNTER — Encounter: Payer: Self-pay | Admitting: Adult Health

## 2016-09-27 ENCOUNTER — Ambulatory Visit (INDEPENDENT_AMBULATORY_CARE_PROVIDER_SITE_OTHER): Payer: BLUE CROSS/BLUE SHIELD | Admitting: Adult Health

## 2016-09-27 VITALS — BP 118/64 | Temp 98.4°F | Ht 74.5 in | Wt 190.0 lb

## 2016-09-27 DIAGNOSIS — R070 Pain in throat: Secondary | ICD-10-CM | POA: Diagnosis not present

## 2016-09-27 DIAGNOSIS — R5383 Other fatigue: Secondary | ICD-10-CM | POA: Diagnosis not present

## 2016-09-27 DIAGNOSIS — M791 Myalgia: Secondary | ICD-10-CM

## 2016-09-27 DIAGNOSIS — R6889 Other general symptoms and signs: Secondary | ICD-10-CM

## 2016-09-27 LAB — POCT INFLUENZA A/B
INFLUENZA A, POC: NEGATIVE
INFLUENZA B, POC: NEGATIVE

## 2016-09-27 NOTE — Progress Notes (Signed)
Subjective:    Patient ID: Andrew Wright, male    DOB: 09-05-1960, 56 y.o.   MRN: [id number]  HPI  56 year old male who presents with 24 hours of sore throat, generalized body aches, and fatigue. He is a Automotive engineer at [employer] has been exposed to multiple people with influenza. He is getting ready to go on a recruiting trip to [place] and wants to make sure that he does not have the flu.  He denies any fevers, n/v/d, or feeling acutely ill  Review of Systems  Constitutional: Positive for fatigue. Negative for activity change, appetite change, chills and fever.  HENT: Positive for sore throat. Negative for trouble swallowing and voice change.   Respiratory: Negative.   Cardiovascular: Negative.   Musculoskeletal: Positive for myalgias.  All other systems reviewed and are negative.  Past Medical History:  Diagnosis Date  . Hypogonadism male 2010  . Migraine     Social History   Social History  . Marital status: Married    Spouse name: N/A  . Number of children: N/A  . Years of education: N/A   Occupational History  . Not on file.   Social History Main Topics  . Smoking status: Never Smoker  . Smokeless tobacco: Never Used  . Alcohol use No  . Drug use: No  . Sexual activity: Yes   Other Topics Concern  . Not on file   Social History Narrative   Caffienated drinks-yes   Seat belt use often-yes   Regular Exercise-yes   Smoke alarm in the home-yes   Firearms/guns in the home-no   History of physical abuse-no                   No past surgical history on file.  Family History  Problem Relation Age of Onset  . Arthritis Other   . Cancer Other     Prostate, Breast,Overian cancer  . Stroke Other     Allergies  Allergen Reactions  . Lovaza [Omega-3-Acid Ethyl Esters] Other (See Comments)    GI upset  . Penicillins Nausea Only    Has patient had a PCN reaction causing immediate rash, facial/tongue/throat swelling, SOB or lightheadedness  with hypotension: No Has patient had a PCN reaction causing severe rash involving mucus membranes or skin necrosis: No Has patient had a PCN reaction that required hospitalization No Has patient had a PCN reaction occurring within the last 10 years: No If all of the above answers are "NO", then may proceed with Cephalosporin use.     Current Outpatient Prescriptions on File Prior to Visit  Medication Sig Dispense Refill  . fenofibrate 160 MG tablet Take 1 tablet (160 mg total) by mouth daily. Yearly physical w/labs due in May 90 tablet 1  . ibuprofen (ADVIL,MOTRIN) 800 MG tablet Take 1 tablet (800 mg total) by mouth 3 (three) times daily. 21 tablet 0  . latanoprost (XALATAN) 0.005 % ophthalmic solution Place 1 drop into both eyes at bedtime.     No current facility-administered medications on file prior to visit.     BP 118/64   Temp 98.4 F (36.9 C) (Oral)   Ht 6' 2.5" (1.892 m)   Wt 190 lb (86.2 kg)   BMI 24.07 kg/m       Objective:   Physical Exam  Constitutional: He is oriented to person, place, and time. He appears well-developed and well-nourished. No distress.  HENT:  Right Ear: Hearing, tympanic membrane, external ear and  ear canal normal.  Left Ear: Hearing, tympanic membrane, external ear and ear canal normal.  Nose: No mucosal edema or rhinorrhea. Right sinus exhibits no maxillary sinus tenderness and no frontal sinus tenderness. Left sinus exhibits no maxillary sinus tenderness and no frontal sinus tenderness.  Mouth/Throat: Uvula is midline, oropharynx is clear and moist and mucous membranes are normal.  Neck: Normal range of motion. Neck supple.  Cardiovascular: Normal rate, regular rhythm, normal heart sounds and intact distal pulses.  Exam reveals no gallop and no friction rub.   No murmur heard. Pulmonary/Chest: Effort normal and breath sounds normal. No respiratory distress. He has no wheezes. He has no rales. He exhibits no tenderness.  Lymphadenopathy:     He has no cervical adenopathy.  Neurological: He is alert and oriented to person, place, and time.  Skin: Skin is warm and dry. No rash noted. He is not diaphoretic. No erythema. No pallor.  Psychiatric: He has a normal mood and affect. His behavior is normal. Judgment and thought content normal.  Nursing note and vitals reviewed.     Assessment & Plan:  1. Flu-like symptoms - POC Influenza A/B- Negative - No signs of strep, likely viral illness. Advised to use Tylenol or Motrin for symptom relief, stay hydrated and rest as much as possible. - Follow-up with me or Dr. Ronnald Ramp if no improvement in the next 2 or 3 days  Dorothyann Peng, NP

## 2016-10-16 DIAGNOSIS — M9904 Segmental and somatic dysfunction of sacral region: Secondary | ICD-10-CM | POA: Diagnosis not present

## 2016-10-16 DIAGNOSIS — M9903 Segmental and somatic dysfunction of lumbar region: Secondary | ICD-10-CM | POA: Diagnosis not present

## 2016-10-16 DIAGNOSIS — M5416 Radiculopathy, lumbar region: Secondary | ICD-10-CM | POA: Diagnosis not present

## 2016-10-16 DIAGNOSIS — M9905 Segmental and somatic dysfunction of pelvic region: Secondary | ICD-10-CM | POA: Diagnosis not present

## 2016-11-19 DIAGNOSIS — M9902 Segmental and somatic dysfunction of thoracic region: Secondary | ICD-10-CM | POA: Diagnosis not present

## 2016-11-19 DIAGNOSIS — M542 Cervicalgia: Secondary | ICD-10-CM | POA: Diagnosis not present

## 2016-11-19 DIAGNOSIS — M9901 Segmental and somatic dysfunction of cervical region: Secondary | ICD-10-CM | POA: Diagnosis not present

## 2016-11-19 DIAGNOSIS — M62838 Other muscle spasm: Secondary | ICD-10-CM | POA: Diagnosis not present

## 2017-01-07 DIAGNOSIS — H401133 Primary open-angle glaucoma, bilateral, severe stage: Secondary | ICD-10-CM | POA: Diagnosis not present

## 2017-01-15 DIAGNOSIS — M542 Cervicalgia: Secondary | ICD-10-CM | POA: Diagnosis not present

## 2017-01-15 DIAGNOSIS — M9903 Segmental and somatic dysfunction of lumbar region: Secondary | ICD-10-CM | POA: Diagnosis not present

## 2017-01-15 DIAGNOSIS — M9904 Segmental and somatic dysfunction of sacral region: Secondary | ICD-10-CM | POA: Diagnosis not present

## 2017-01-15 DIAGNOSIS — M545 Low back pain: Secondary | ICD-10-CM | POA: Diagnosis not present

## 2017-01-29 ENCOUNTER — Other Ambulatory Visit (INDEPENDENT_AMBULATORY_CARE_PROVIDER_SITE_OTHER): Payer: BLUE CROSS/BLUE SHIELD

## 2017-01-29 ENCOUNTER — Ambulatory Visit (INDEPENDENT_AMBULATORY_CARE_PROVIDER_SITE_OTHER): Payer: BLUE CROSS/BLUE SHIELD | Admitting: Internal Medicine

## 2017-01-29 ENCOUNTER — Encounter: Payer: Self-pay | Admitting: Internal Medicine

## 2017-01-29 VITALS — BP 134/84 | HR 76 | Temp 98.0°F | Resp 16 | Ht 74.5 in | Wt 194.2 lb

## 2017-01-29 DIAGNOSIS — F330 Major depressive disorder, recurrent, mild: Secondary | ICD-10-CM

## 2017-01-29 DIAGNOSIS — R7989 Other specified abnormal findings of blood chemistry: Secondary | ICD-10-CM | POA: Diagnosis not present

## 2017-01-29 DIAGNOSIS — R945 Abnormal results of liver function studies: Secondary | ICD-10-CM

## 2017-01-29 DIAGNOSIS — R972 Elevated prostate specific antigen [PSA]: Secondary | ICD-10-CM | POA: Diagnosis not present

## 2017-01-29 DIAGNOSIS — E785 Hyperlipidemia, unspecified: Secondary | ICD-10-CM

## 2017-01-29 DIAGNOSIS — Z0000 Encounter for general adult medical examination without abnormal findings: Secondary | ICD-10-CM

## 2017-01-29 LAB — COMPREHENSIVE METABOLIC PANEL
ALK PHOS: 45 U/L (ref 39–117)
ALT: 48 U/L (ref 0–53)
AST: 34 U/L (ref 0–37)
Albumin: 4.8 g/dL (ref 3.5–5.2)
BUN: 19 mg/dL (ref 6–23)
CALCIUM: 9.7 mg/dL (ref 8.4–10.5)
CO2: 31 mEq/L (ref 19–32)
Chloride: 105 mEq/L (ref 96–112)
Creatinine, Ser: 1.24 mg/dL (ref 0.40–1.50)
GFR: 63.82 mL/min (ref >60.00)
Glucose, Bld: 88 mg/dL (ref 70–99)
POTASSIUM: 4.2 meq/L (ref 3.5–5.1)
Sodium: 142 mEq/L (ref 135–145)
Total Bilirubin: 0.6 mg/dL (ref 0.2–1.2)
Total Protein: 7 g/dL (ref 6.0–8.3)

## 2017-01-29 LAB — LIPID PANEL
Cholesterol: 208 mg/dL — ABNORMAL HIGH (ref 0–200)
HDL: 47 mg/dL (ref >39.00)
LDL CALC: 127 mg/dL — AB (ref 0–99)
NonHDL: 161.08
Total CHOL/HDL Ratio: 4
Triglycerides: 172 mg/dL — ABNORMAL HIGH (ref 0.0–149.0)
VLDL: 34.4 mg/dL (ref 0.0–40.0)

## 2017-01-29 LAB — PSA: PSA: 6.65 ng/mL — AB (ref 0.10–4.00)

## 2017-01-29 LAB — GAMMA GT: GGT: 14 U/L (ref 7–51)

## 2017-01-29 NOTE — Progress Notes (Signed)
Subjective:  Patient ID: Andrew Wright, male    DOB: [birth year]  Age: 56 y.o. MRN: [id number]  CC: Annual Exam; Depression; and Hyperlipidemia   HPI [name] presents for a CPX.  He complains of a several month history of feeling depressed and lonely with anhedonia, fatigue, and apathy. His sleep is normal, his appetite normal, but he complains of weight gain. He denies SI or HI. He tells me that he looks a poor in for 3-4 hours a day and frequently masturbates.  Outpatient Medications Prior to Visit  Medication Sig Dispense Refill  . fenofibrate 160 MG tablet Take 1 tablet (160 mg total) by mouth daily. Yearly physical w/labs due in May 90 tablet 1  . ibuprofen (ADVIL,MOTRIN) 800 MG tablet Take 1 tablet (800 mg total) by mouth 3 (three) times daily. 21 tablet 0  . latanoprost (XALATAN) 0.005 % ophthalmic solution Place 1 drop into both eyes at bedtime.     No facility-administered medications prior to visit.     ROS Review of Systems  Constitutional: Positive for fatigue and unexpected weight change. Negative for activity change, appetite change and diaphoresis.  HENT: Negative.   Eyes: Negative for visual disturbance.  Respiratory: Negative for cough, chest tightness and shortness of breath.   Cardiovascular: Negative.  Negative for chest pain and leg swelling.  Gastrointestinal: Negative.  Negative for abdominal pain, constipation, diarrhea, nausea and vomiting.  Endocrine: Negative.   Genitourinary: Negative.  Negative for difficulty urinating, dysuria, frequency, hematuria, penile swelling, scrotal swelling, testicular pain and urgency.  Musculoskeletal: Negative.   Skin: Negative.   Allergic/Immunologic: Negative.   Neurological: Negative.  Negative for dizziness, weakness, numbness and headaches.  Hematological: Negative.   Psychiatric/Behavioral: Positive for dysphoric mood. Negative for behavioral problems, confusion, decreased concentration, self-injury, sleep  disturbance and suicidal ideas. The patient is not nervous/anxious.     Objective:  BP 134/84 (BP Location: Left Arm, Patient Position: Sitting, Cuff Size: Normal)   Pulse 76   Temp 98 F (36.7 C) (Oral)   Resp 16   Ht 6' 2.5" (1.892 m)   Wt 194 lb 4 oz (88.1 kg)   SpO2 99%   BMI 24.61 kg/m   BP Readings from Last 3 Encounters:  01/29/17 134/84  09/27/16 118/64  05/22/16 120/82    Wt Readings from Last 3 Encounters:  01/29/17 194 lb 4 oz (88.1 kg)  09/27/16 190 lb (86.2 kg)  05/17/16 190 lb (86.2 kg)    Physical Exam  Constitutional: He is oriented to person, place, and time. No distress.  HENT:  Mouth/Throat: Oropharynx is clear and moist. No oropharyngeal exudate.  Eyes: Conjunctivae are normal. Right eye exhibits no discharge. Left eye exhibits no discharge. No scleral icterus.  Neck: Normal range of motion. Neck supple. No JVD present. No thyromegaly present.  Cardiovascular: Normal rate, regular rhythm and normal heart sounds.  Exam reveals no gallop.   No murmur heard. Pulmonary/Chest: Effort normal and breath sounds normal. No respiratory distress. He has no wheezes. He has no rales. He exhibits no tenderness.  Abdominal: Soft. Bowel sounds are normal. He exhibits no distension and no mass. There is no tenderness. Hernia confirmed negative in the right inguinal area and confirmed negative in the left inguinal area.  Genitourinary: Rectum normal, testes normal and penis normal. Rectal exam shows no external hemorrhoid, no internal hemorrhoid, no fissure, no mass, no tenderness, anal tone normal and guaiac negative stool. Prostate is enlarged (1+ smooth symm BPH). Prostate is not  tender. Right testis shows no mass, no swelling and no tenderness. Right testis is descended. Left testis shows no mass, no swelling and no tenderness. Left testis is descended. Circumcised. No penile erythema or penile tenderness. No discharge found.  Musculoskeletal: Normal range of motion. He  exhibits no edema, tenderness or deformity.  Lymphadenopathy:    He has no cervical adenopathy.       Right: No inguinal adenopathy present.       Left: No inguinal adenopathy present.  Neurological: He is alert and oriented to person, place, and time.  Skin: Skin is warm and dry. No rash noted. He is not diaphoretic. No erythema. No pallor.  Vitals reviewed.   Lab Results  Component Value Date   WBC 5.4 01/16/2016   HGB 15.0 01/16/2016   HCT 43.7 01/16/2016   PLT 271.0 01/16/2016   GLUCOSE 88 01/29/2017   CHOL 208 (H) 01/29/2017   TRIG 172.0 (H) 01/29/2017   HDL 47.00 01/29/2017   LDLDIRECT 115.0 01/16/2016   LDLCALC 127 (H) 01/29/2017   ALT 48 01/29/2017   AST 34 01/29/2017   NA 142 01/29/2017   K 4.2 01/29/2017   CL 105 01/29/2017   CREATININE 1.24 01/29/2017   BUN 19 01/29/2017   CO2 31 01/29/2017   TSH 1.42 01/16/2016   PSA 6.65 (H) 01/29/2017    Dg Chest 2 View  Result Date: 09/18/2015 CLINICAL DATA:  Chest and neck pain after doing pushups 2 days ago. EXAM: CHEST  2 VIEW COMPARISON:  None. FINDINGS: The cardiac silhouette, mediastinal and hilar contours are normal. The lungs are clear. No pleural effusion or pneumothorax. The bony thorax is intact. IMPRESSION: No acute cardiopulmonary findings. Electronically Signed   By: Marijo Sanes M.D.   On: 09/18/2015 19:35    Assessment & Plan:   Bradd was seen today for annual exam, depression and hyperlipidemia.  Diagnoses and all orders for this visit:  Routine general medical examination at a health care facility- exam completed, labs ordered and reviewed, vaccines reviewed, colon cancer screening is up-to-date, patient education material was given. -     Lipid panel; Future -     PSA; Future  Elevated LFTs- he has no symptoms related to this and his liver enzymes are normal now, screening for viral hepatitis is negative. I've asked him to come in and start the vaccine process against hepatitis A and B. -      Comprehensive metabolic panel; Future -     Gamma GT; Future -     Hepatitis B surface antigen; Future -     Hepatitis A antibody, total; Future -     Hepatitis B core antibody, total; Future -     Hepatitis B surface antibody; Future -     Hepatitis C antibody; Future  Dyslipidemia, goal LDL below 130- his Framingham risk score is up to 12% so Avastin to start statin and aspirin therapy for cardiovascular risk reduction. -     Discontinue: rosuvastatin (CRESTOR) 5 MG tablet; Take 1 tablet (5 mg total) by mouth at bedtime. -     Discontinue: aspirin 81 MG EC tablet; Take 1 tablet (81 mg total) by mouth daily. Swallow whole. -     rosuvastatin (CRESTOR) 5 MG tablet; Take 1 tablet (5 mg total) by mouth at bedtime. -     aspirin 81 MG EC tablet; Take 1 tablet (81 mg total) by mouth daily. Swallow whole.  PSA elevation- his PSA has suddenly risen up  to 6.65. I have asked him to return in about 2 months for me to recheck this. I have asked him to refrain from ejaculation 1 week prior to repeat testing as this may have elevated his recent PSA level.   Mild episode of recurrent major depressive disorder ([place])- he is not willing to start an antidepressant for this, I have referred him for psychotherapy and have asked him to follow-up in the next 1-2 months to let me know if he is willing to start taking an antidepressant.   I have discontinued Mr. Vilar latanoprost and ibuprofen. I am also having him maintain his fenofibrate, rosuvastatin, and aspirin.  Meds ordered this encounter  Medications  . DISCONTD: rosuvastatin (CRESTOR) 5 MG tablet    Sig: Take 1 tablet (5 mg total) by mouth at bedtime.    Dispense:  90 tablet    Refill:  3  . DISCONTD: aspirin 81 MG EC tablet    Sig: Take 1 tablet (81 mg total) by mouth daily. Swallow whole.    Dispense:  90 tablet    Refill:  3  . rosuvastatin (CRESTOR) 5 MG tablet    Sig: Take 1 tablet (5 mg total) by mouth at bedtime.    Dispense:  90 tablet      Refill:  3  . aspirin 81 MG EC tablet    Sig: Take 1 tablet (81 mg total) by mouth daily. Swallow whole.    Dispense:  90 tablet    Refill:  3     Follow-up: Return in about 3 weeks (around 02/19/2017).  [name], MD

## 2017-01-29 NOTE — Patient Instructions (Addendum)
Major Depressive Disorder, Adult Major depressive disorder (MDD) is a mental health condition. It may also be called clinical depression or unipolar depression. MDD usually causes feelings of sadness, hopelessness, or helplessness. MDD can also cause physical symptoms. It can interfere with work, school, relationships, and other everyday activities. MDD may be mild, moderate, or severe. It may occur once (single episode major depressive disorder) or it may occur multiple times (recurrent major depressive disorder). What are the causes? The exact cause of this condition is not known. MDD is most likely caused by a combination of things, which may include:  Genetic factors. These are traits that are passed along from parent to child.  Individual factors. Your personality, your behavior, and the way you handle your thoughts and feelings may contribute to MDD. This includes personality traits and behaviors learned from others.  Physical factors, such as: ? Differences in the part of your brain that controls emotion. This part of your brain may be different than it is in people who do not have MDD. ? Long-term (chronic) medical or psychiatric illnesses.  Social factors. Traumatic experiences or major life changes may play a role in the development of MDD.  What increases the risk? This condition is more likely to develop in women. The following factors may also make you more likely to develop MDD:  A family history of depression.  Troubled family relationships.  Abnormally low levels of certain brain chemicals.  Traumatic events in childhood, especially abuse or the loss of a parent.  Being under a lot of stress, or long-term stress, especially from upsetting life experiences or losses.  A history of: ? Chronic physical illness. ? Other mental health disorders. ? Substance abuse.  Poor living conditions.  Experiencing social exclusion or discrimination on a regular basis.  What are  the signs or symptoms? The main symptoms of MDD typically include:  Constant depressed or irritable mood.  Loss of interest in things and activities.  MDD symptoms may also include:  Sleeping or eating too much or too little.  Unexplained weight change.  Fatigue or low energy.  Feelings of worthlessness or guilt.  Difficulty thinking clearly or making decisions.  Thoughts of suicide or of harming others.  Physical agitation or weakness.  Isolation.  Severe cases of MDD may also occur with other symptoms, such as:  Delusions or hallucinations, in which you imagine things that are not real (psychotic depression).  Low-level depression that lasts at least a year (chronic depression or persistent depressive disorder).  Extreme sadness and hopelessness (melancholic depression).  Trouble speaking and moving (catatonic depression).  How is this diagnosed? This condition may be diagnosed based on:  Your symptoms.  Your medical history, including your mental health history. This may involve tests to evaluate your mental health. You may be asked questions about your lifestyle, including any drug and alcohol use, and how long you have had symptoms of MDD.  A physical exam.  Blood tests to rule out other conditions.  You must have a depressed mood and at least four other MDD symptoms most of the day, nearly every day in the same 2-week timeframe before your health care provider can confirm a diagnosis of MDD. How is this treated? This condition is usually treated by mental health professionals, such as psychologists, psychiatrists, and clinical social workers. You may need more than one type of treatment. Treatment may include:  Psychotherapy. This is also called talk therapy or counseling. Types of psychotherapy include: ? Cognitive behavioral  therapy (CBT). This type of therapy teaches you to recognize unhealthy feelings, thoughts, and behaviors, and replace them with  positive thoughts and actions. ? Interpersonal therapy (IPT). This helps you to improve the way you relate to and communicate with others. ? Family therapy. This treatment includes members of your family.  Medicine to treat anxiety and depression, or to help you control certain emotions and behaviors.  Lifestyle changes, such as: ? Limiting alcohol and drug use. ? Exercising regularly. ? Getting plenty of sleep. ? Making healthy eating choices. ? Spending more time outdoors.  Treatments involving stimulation of the brain can be used in situations with extremely severe symptoms, or when medicine or other therapies do not work over time. These treatments include electroconvulsive therapy, transcranial magnetic stimulation, and vagal nerve stimulation. Follow these instructions at home: Activity  Return to your normal activities as told by your health care provider.  Exercise regularly and spend time outdoors as told by your health care provider. General instructions  Take over-the-counter and prescription medicines only as told by your health care provider.  Do not drink alcohol. If you drink alcohol, limit your alcohol intake to no more than 1 drink a day for nonpregnant women and 2 drinks a day for men. One drink equals 12 oz of beer, 5 oz of wine, or 1 oz of hard liquor. Alcohol can affect any antidepressant medicines you are taking. Talk to your health care provider about your alcohol use.  Eat a healthy diet and get plenty of sleep.  Find activities that you enjoy doing, and make time to do them.  Consider joining a support group. Your health care provider may be able to recommend a support group.  Keep all follow-up visits as told by your health care provider. This is important. Where to find more information: [employer] on Mental Illness  www.nami.org  U.[place]. National Institute of Mental Health  https://carter.com/  National Suicide Prevention  Lifeline  1-800-273-TALK 561 149 9739). This is free, 24-hour help.  Contact a health care provider if:  Your symptoms get worse.  You develop new symptoms. Get help right away if:  You self-harm.  You have serious thoughts about hurting yourself or others.  You see, hear, taste, smell, or feel things that are not present (hallucinate). This information is not intended to replace advice given to you by your health care provider. Make sure you discuss any questions you have with your health care provider. Document Released: 12/08/2012 Document Revised: 04/19/2016 Document Reviewed: 02/22/2016 Elsevier Interactive Patient Education  2017 [employer].

## 2017-01-30 ENCOUNTER — Encounter: Payer: Self-pay | Admitting: Internal Medicine

## 2017-01-30 DIAGNOSIS — F330 Major depressive disorder, recurrent, mild: Secondary | ICD-10-CM | POA: Insufficient documentation

## 2017-01-30 DIAGNOSIS — R972 Elevated prostate specific antigen [PSA]: Secondary | ICD-10-CM | POA: Insufficient documentation

## 2017-01-30 LAB — HEPATITIS B SURFACE ANTIGEN: Hepatitis B Surface Ag: NEGATIVE

## 2017-01-30 LAB — HEPATITIS C ANTIBODY: HCV AB: NEGATIVE

## 2017-01-30 LAB — HEPATITIS A ANTIBODY, TOTAL: Hep A Total Ab: NONREACTIVE

## 2017-01-30 LAB — HEPATITIS B CORE ANTIBODY, TOTAL: HEP B C TOTAL AB: NONREACTIVE

## 2017-01-30 LAB — HEPATITIS B SURFACE ANTIBODY,QUALITATIVE: Hep B S Ab: NEGATIVE

## 2017-01-30 MED ORDER — ROSUVASTATIN CALCIUM 5 MG PO TABS: 5.0000 mg | ORAL_TABLET | Freq: Every day | ORAL | 3 refills | Status: DC

## 2017-01-30 MED ORDER — ASPIRIN 81 MG PO TBEC: 81.0000 mg | DELAYED_RELEASE_TABLET | Freq: Every day | ORAL | 3 refills | Status: DC

## 2017-02-06 DIAGNOSIS — F330 Major depressive disorder, recurrent, mild: Secondary | ICD-10-CM | POA: Diagnosis not present

## 2017-02-13 DIAGNOSIS — F330 Major depressive disorder, recurrent, mild: Secondary | ICD-10-CM | POA: Diagnosis not present

## 2017-02-18 ENCOUNTER — Other Ambulatory Visit: Payer: Self-pay | Admitting: Internal Medicine

## 2017-02-18 DIAGNOSIS — E781 Pure hyperglyceridemia: Secondary | ICD-10-CM

## 2017-02-20 ENCOUNTER — Other Ambulatory Visit: Payer: Self-pay | Admitting: Internal Medicine

## 2017-02-20 DIAGNOSIS — E781 Pure hyperglyceridemia: Secondary | ICD-10-CM

## 2017-02-20 DIAGNOSIS — F330 Major depressive disorder, recurrent, mild: Secondary | ICD-10-CM | POA: Diagnosis not present

## 2017-02-21 ENCOUNTER — Telehealth: Payer: Self-pay | Admitting: Internal Medicine

## 2017-02-21 DIAGNOSIS — E781 Pure hyperglyceridemia: Secondary | ICD-10-CM

## 2017-02-21 MED ORDER — FENOFIBRATE 160 MG PO TABS: 160.0000 mg | ORAL_TABLET | Freq: Every day | ORAL | 1 refills | Status: DC

## 2017-02-21 NOTE — Telephone Encounter (Signed)
Pt would like a refill of fenofibrate 160 MG tablet   [facility] on [address]

## 2017-02-21 NOTE — Telephone Encounter (Signed)
erx sent

## 2017-02-28 DIAGNOSIS — F330 Major depressive disorder, recurrent, mild: Secondary | ICD-10-CM | POA: Diagnosis not present

## 2017-03-06 ENCOUNTER — Other Ambulatory Visit: Payer: BLUE CROSS/BLUE SHIELD

## 2017-03-06 ENCOUNTER — Ambulatory Visit (INDEPENDENT_AMBULATORY_CARE_PROVIDER_SITE_OTHER): Payer: BLUE CROSS/BLUE SHIELD | Admitting: Internal Medicine

## 2017-03-06 ENCOUNTER — Encounter: Payer: Self-pay | Admitting: Internal Medicine

## 2017-03-06 VITALS — BP 122/78 | HR 71 | Temp 98.3°F | Resp 16 | Ht 74.5 in | Wt 186.0 lb

## 2017-03-06 DIAGNOSIS — E785 Hyperlipidemia, unspecified: Secondary | ICD-10-CM | POA: Diagnosis not present

## 2017-03-06 DIAGNOSIS — R972 Elevated prostate specific antigen [PSA]: Secondary | ICD-10-CM

## 2017-03-06 DIAGNOSIS — K921 Melena: Secondary | ICD-10-CM | POA: Insufficient documentation

## 2017-03-06 DIAGNOSIS — E781 Pure hyperglyceridemia: Secondary | ICD-10-CM

## 2017-03-06 DIAGNOSIS — F330 Major depressive disorder, recurrent, mild: Secondary | ICD-10-CM | POA: Diagnosis not present

## 2017-03-06 NOTE — Progress Notes (Signed)
Subjective:  Patient ID: Andrew Wright, male    DOB: 02/08/1961  Age: 56 y.o. MRN: [id number]  CC: Hyperlipidemia   HPI Kunio Cummiskey presents for f/up - When I last saw Mose concerned about CV risk reduction and asked him to start taking a baby aspirin a day. He tried that and said it caused him to have some bloody stool. He wants to be referred for colonoscopy to be sure that he doesn't have colon cancer or polyps. He has started psychotherapy and tells me he feels much better. He is tolerating the combination of fenofibrate and rosuvastatin well with no muscle or joint aches. Is also due for recheck on his PSA which was mildly elevated. He was ejaculated frequently when his previous PSA was done and today he tells me he has not ejaculated in several weeks.  Outpatient Medications Prior to Visit  Medication Sig Dispense Refill  . fenofibrate 160 MG tablet Take 1 tablet (160 mg total) by mouth daily. 90 tablet 1  . rosuvastatin (CRESTOR) 5 MG tablet Take 1 tablet (5 mg total) by mouth at bedtime. 90 tablet 3  . aspirin 81 MG EC tablet Take 1 tablet (81 mg total) by mouth daily. Swallow whole. (Patient not taking: Reported on 03/06/2017) 90 tablet 3   No facility-administered medications prior to visit.     ROS Review of Systems  Constitutional: Negative.  Negative for diaphoresis and fatigue.  HENT: Negative.  Negative for trouble swallowing.   Eyes: Negative for visual disturbance.  Respiratory: Negative for chest tightness and shortness of breath.   Cardiovascular: Negative for chest pain.  Gastrointestinal: Positive for anal bleeding and blood in stool. Negative for abdominal pain, diarrhea, nausea and vomiting.  Endocrine: Negative.   Genitourinary: Negative.  Negative for difficulty urinating.  Musculoskeletal: Negative.  Negative for arthralgias, back pain, myalgias and neck pain.  Skin: Negative.   Allergic/Immunologic: Negative.   Neurological: Negative.  Negative for  dizziness.  Hematological: Negative for adenopathy. Does not bruise/bleed easily.  Psychiatric/Behavioral: Negative.     Objective:  BP 122/78 (BP Location: Left Arm, Patient Position: Sitting, Cuff Size: Normal)   Pulse 71   Temp 98.3 F (36.8 C) (Oral)   Resp 16   Ht 6' 2.5" (1.892 m)   Wt 186 lb (84.4 kg)   SpO2 99%   BMI 23.56 kg/m   BP Readings from Last 3 Encounters:  03/06/17 122/78  01/29/17 134/84  09/27/16 118/64    Wt Readings from Last 3 Encounters:  03/06/17 186 lb (84.4 kg)  01/29/17 194 lb 4 oz (88.1 kg)  09/27/16 190 lb (86.2 kg)    Physical Exam  Constitutional: He is oriented to person, place, and time. No distress.  HENT:  Mouth/Throat: Oropharynx is clear and moist. No oropharyngeal exudate.  Eyes: Conjunctivae are normal. Right eye exhibits no discharge. Left eye exhibits no discharge. No scleral icterus.  Neck: Normal range of motion. Neck supple. No JVD present. No thyromegaly present.  Cardiovascular: Normal rate, regular rhythm and intact distal pulses.  Exam reveals no gallop and no friction rub.   No murmur heard. Pulmonary/Chest: Effort normal and breath sounds normal. No respiratory distress. He has no wheezes. He has no rales. He exhibits no tenderness.  Abdominal: Soft. Bowel sounds are normal. He exhibits no distension and no mass. There is no tenderness. There is no rebound and no guarding.  Musculoskeletal: Normal range of motion. He exhibits no edema, tenderness or deformity.  Lymphadenopathy:  He has no cervical adenopathy.  Neurological: He is alert and oriented to person, place, and time.  Skin: Skin is warm and dry. No rash noted. He is not diaphoretic. No erythema. No pallor.  Vitals reviewed.   Lab Results  Component Value Date   WBC 5.4 01/16/2016   HGB 15.0 01/16/2016   HCT 43.7 01/16/2016   PLT 271.0 01/16/2016   GLUCOSE 88 01/29/2017   CHOL 131 03/07/2017   TRIG 78.0 03/07/2017   HDL 45.70 03/07/2017   LDLDIRECT  115.0 01/16/2016   LDLCALC 69 03/07/2017   ALT 48 01/29/2017   AST 34 01/29/2017   NA 142 01/29/2017   K 4.2 01/29/2017   CL 105 01/29/2017   CREATININE 1.24 01/29/2017   BUN 19 01/29/2017   CO2 31 01/29/2017   TSH 1.42 01/16/2016   PSA 3.92 03/07/2017    Dg Chest 2 View  Result Date: 09/18/2015 CLINICAL DATA:  Chest and neck pain after doing pushups 2 days ago. EXAM: CHEST  2 VIEW COMPARISON:  None. FINDINGS: The cardiac silhouette, mediastinal and hilar contours are normal. The lungs are clear. No pleural effusion or pneumothorax. The bony thorax is intact. IMPRESSION: No acute cardiopulmonary findings. Electronically Signed   By: Marijo Sanes M.D.   On: 09/18/2015 19:35    Assessment & Plan:   Gurjot was seen today for hyperlipidemia.  Diagnoses and all orders for this visit:  PSA elevation- his PSA is in the upper normal range now. Based on his level today there is been no increase over the last 2 years. This is reassuring that he doesn't have prostate cancer. -     PSA; Future  Hypertriglyceridemia- improvement noted -     Lipid panel; Future  Dyslipidemia, goal LDL below 130- he has achieved his LDL goal is doing well on the statin. -     Lipid panel; Future  Blood in stool -     Ambulatory referral to Gastroenterology   I have discontinued Mr. Dalton aspirin. I am also having him maintain his rosuvastatin, fenofibrate, and latanoprost.  Meds ordered this encounter  Medications  . latanoprost (XALATAN) 0.005 % ophthalmic solution    Sig: instill 1 drop INTO AFFECTED EYE ONCE EVERY EVENING    Refill:  0     Follow-up: Return in about 6 months (around 09/06/2017).  [name], MD

## 2017-03-06 NOTE — Progress Notes (Signed)
Pre visit review using our clinic review tool, if applicable. No additional management support is needed unless otherwise documented below in the visit note. 

## 2017-03-06 NOTE — Patient Instructions (Signed)

## 2017-03-07 ENCOUNTER — Other Ambulatory Visit (INDEPENDENT_AMBULATORY_CARE_PROVIDER_SITE_OTHER): Payer: BLUE CROSS/BLUE SHIELD

## 2017-03-07 ENCOUNTER — Encounter: Payer: Self-pay | Admitting: Internal Medicine

## 2017-03-07 DIAGNOSIS — E785 Hyperlipidemia, unspecified: Secondary | ICD-10-CM

## 2017-03-07 DIAGNOSIS — E781 Pure hyperglyceridemia: Secondary | ICD-10-CM

## 2017-03-07 DIAGNOSIS — R972 Elevated prostate specific antigen [PSA]: Secondary | ICD-10-CM | POA: Diagnosis not present

## 2017-03-07 LAB — LIPID PANEL
CHOL/HDL RATIO: 3
Cholesterol: 131 mg/dL (ref 0–200)
HDL: 45.7 mg/dL (ref >39.00)
LDL Cholesterol: 69 mg/dL (ref 0–99)
NONHDL: 84.99
TRIGLYCERIDES: 78 mg/dL (ref 0.0–149.0)
VLDL: 15.6 mg/dL (ref 0.0–40.0)

## 2017-03-07 LAB — PSA: PSA: 3.92 ng/mL (ref 0.10–4.00)

## 2017-03-13 DIAGNOSIS — F330 Major depressive disorder, recurrent, mild: Secondary | ICD-10-CM | POA: Diagnosis not present

## 2017-03-18 DIAGNOSIS — M5416 Radiculopathy, lumbar region: Secondary | ICD-10-CM | POA: Diagnosis not present

## 2017-03-18 DIAGNOSIS — M9904 Segmental and somatic dysfunction of sacral region: Secondary | ICD-10-CM | POA: Diagnosis not present

## 2017-03-18 DIAGNOSIS — M9905 Segmental and somatic dysfunction of pelvic region: Secondary | ICD-10-CM | POA: Diagnosis not present

## 2017-03-18 DIAGNOSIS — M9903 Segmental and somatic dysfunction of lumbar region: Secondary | ICD-10-CM | POA: Diagnosis not present

## 2017-03-19 DIAGNOSIS — M9903 Segmental and somatic dysfunction of lumbar region: Secondary | ICD-10-CM | POA: Diagnosis not present

## 2017-03-19 DIAGNOSIS — M9905 Segmental and somatic dysfunction of pelvic region: Secondary | ICD-10-CM | POA: Diagnosis not present

## 2017-03-19 DIAGNOSIS — M9904 Segmental and somatic dysfunction of sacral region: Secondary | ICD-10-CM | POA: Diagnosis not present

## 2017-03-19 DIAGNOSIS — M5416 Radiculopathy, lumbar region: Secondary | ICD-10-CM | POA: Diagnosis not present

## 2017-03-20 DIAGNOSIS — F330 Major depressive disorder, recurrent, mild: Secondary | ICD-10-CM | POA: Diagnosis not present

## 2017-03-21 DIAGNOSIS — M9904 Segmental and somatic dysfunction of sacral region: Secondary | ICD-10-CM | POA: Diagnosis not present

## 2017-03-21 DIAGNOSIS — M9905 Segmental and somatic dysfunction of pelvic region: Secondary | ICD-10-CM | POA: Diagnosis not present

## 2017-03-21 DIAGNOSIS — M9903 Segmental and somatic dysfunction of lumbar region: Secondary | ICD-10-CM | POA: Diagnosis not present

## 2017-03-21 DIAGNOSIS — M5416 Radiculopathy, lumbar region: Secondary | ICD-10-CM | POA: Diagnosis not present

## 2017-03-26 DIAGNOSIS — M9904 Segmental and somatic dysfunction of sacral region: Secondary | ICD-10-CM | POA: Diagnosis not present

## 2017-03-26 DIAGNOSIS — M5416 Radiculopathy, lumbar region: Secondary | ICD-10-CM | POA: Diagnosis not present

## 2017-03-26 DIAGNOSIS — M9903 Segmental and somatic dysfunction of lumbar region: Secondary | ICD-10-CM | POA: Diagnosis not present

## 2017-03-26 DIAGNOSIS — M9905 Segmental and somatic dysfunction of pelvic region: Secondary | ICD-10-CM | POA: Diagnosis not present

## 2017-03-27 ENCOUNTER — Telehealth: Payer: Self-pay

## 2017-03-27 DIAGNOSIS — F330 Major depressive disorder, recurrent, mild: Secondary | ICD-10-CM | POA: Diagnosis not present

## 2017-03-27 DIAGNOSIS — M9903 Segmental and somatic dysfunction of lumbar region: Secondary | ICD-10-CM | POA: Diagnosis not present

## 2017-03-27 DIAGNOSIS — M9904 Segmental and somatic dysfunction of sacral region: Secondary | ICD-10-CM | POA: Diagnosis not present

## 2017-03-27 DIAGNOSIS — M9905 Segmental and somatic dysfunction of pelvic region: Secondary | ICD-10-CM | POA: Diagnosis not present

## 2017-03-27 DIAGNOSIS — M5416 Radiculopathy, lumbar region: Secondary | ICD-10-CM | POA: Diagnosis not present

## 2017-03-27 NOTE — Telephone Encounter (Signed)
ROI fax to [place]

## 2017-03-29 NOTE — Telephone Encounter (Signed)
Rec'd from [facility] forwarded 4 pages to Provider Historical GI

## 2017-04-01 ENCOUNTER — Encounter: Payer: Self-pay | Admitting: Gastroenterology

## 2017-04-01 DIAGNOSIS — M9905 Segmental and somatic dysfunction of pelvic region: Secondary | ICD-10-CM | POA: Diagnosis not present

## 2017-04-01 DIAGNOSIS — M5416 Radiculopathy, lumbar region: Secondary | ICD-10-CM | POA: Diagnosis not present

## 2017-04-01 DIAGNOSIS — M9904 Segmental and somatic dysfunction of sacral region: Secondary | ICD-10-CM | POA: Diagnosis not present

## 2017-04-01 DIAGNOSIS — M9903 Segmental and somatic dysfunction of lumbar region: Secondary | ICD-10-CM | POA: Diagnosis not present

## 2017-04-02 ENCOUNTER — Telehealth: Payer: Self-pay | Admitting: Gastroenterology

## 2017-04-02 NOTE — Telephone Encounter (Signed)
Records of last colonoscopy received:  Done on 08/18/2009 by a Dr. Enrigue Catena at [facility] - 72m transverse polyp - 2 x 2-377msigmoid polyps - hemorrhoids - otherwise normal exam  No pathology results available.  Patient is scheduled to see me in the office in October to establish care. I will attempt to obtain path results in the interim

## 2017-04-03 DIAGNOSIS — F330 Major depressive disorder, recurrent, mild: Secondary | ICD-10-CM | POA: Diagnosis not present

## 2017-04-04 DIAGNOSIS — M9903 Segmental and somatic dysfunction of lumbar region: Secondary | ICD-10-CM | POA: Diagnosis not present

## 2017-04-04 DIAGNOSIS — M9904 Segmental and somatic dysfunction of sacral region: Secondary | ICD-10-CM | POA: Diagnosis not present

## 2017-04-04 DIAGNOSIS — M9905 Segmental and somatic dysfunction of pelvic region: Secondary | ICD-10-CM | POA: Diagnosis not present

## 2017-04-04 DIAGNOSIS — M5416 Radiculopathy, lumbar region: Secondary | ICD-10-CM | POA: Diagnosis not present

## 2017-04-08 DIAGNOSIS — M9905 Segmental and somatic dysfunction of pelvic region: Secondary | ICD-10-CM | POA: Diagnosis not present

## 2017-04-08 DIAGNOSIS — M9904 Segmental and somatic dysfunction of sacral region: Secondary | ICD-10-CM | POA: Diagnosis not present

## 2017-04-08 DIAGNOSIS — M9903 Segmental and somatic dysfunction of lumbar region: Secondary | ICD-10-CM | POA: Diagnosis not present

## 2017-04-08 DIAGNOSIS — M5416 Radiculopathy, lumbar region: Secondary | ICD-10-CM | POA: Diagnosis not present

## 2017-04-10 DIAGNOSIS — M5416 Radiculopathy, lumbar region: Secondary | ICD-10-CM | POA: Diagnosis not present

## 2017-04-10 DIAGNOSIS — F330 Major depressive disorder, recurrent, mild: Secondary | ICD-10-CM | POA: Diagnosis not present

## 2017-04-10 DIAGNOSIS — M9903 Segmental and somatic dysfunction of lumbar region: Secondary | ICD-10-CM | POA: Diagnosis not present

## 2017-04-10 DIAGNOSIS — M9904 Segmental and somatic dysfunction of sacral region: Secondary | ICD-10-CM | POA: Diagnosis not present

## 2017-04-10 DIAGNOSIS — M9905 Segmental and somatic dysfunction of pelvic region: Secondary | ICD-10-CM | POA: Diagnosis not present

## 2017-04-11 ENCOUNTER — Encounter: Payer: Self-pay | Admitting: Internal Medicine

## 2017-04-15 ENCOUNTER — Encounter: Payer: Self-pay | Admitting: Internal Medicine

## 2017-04-15 DIAGNOSIS — M9903 Segmental and somatic dysfunction of lumbar region: Secondary | ICD-10-CM | POA: Diagnosis not present

## 2017-04-15 DIAGNOSIS — M9904 Segmental and somatic dysfunction of sacral region: Secondary | ICD-10-CM | POA: Diagnosis not present

## 2017-04-15 DIAGNOSIS — M5416 Radiculopathy, lumbar region: Secondary | ICD-10-CM | POA: Diagnosis not present

## 2017-04-15 DIAGNOSIS — F330 Major depressive disorder, recurrent, mild: Secondary | ICD-10-CM | POA: Diagnosis not present

## 2017-04-15 DIAGNOSIS — M9905 Segmental and somatic dysfunction of pelvic region: Secondary | ICD-10-CM | POA: Diagnosis not present

## 2017-04-15 NOTE — Progress Notes (Unsigned)
Abstracted and sent to scan  

## 2017-04-18 DIAGNOSIS — M9904 Segmental and somatic dysfunction of sacral region: Secondary | ICD-10-CM | POA: Diagnosis not present

## 2017-04-18 DIAGNOSIS — M9905 Segmental and somatic dysfunction of pelvic region: Secondary | ICD-10-CM | POA: Diagnosis not present

## 2017-04-18 DIAGNOSIS — M5416 Radiculopathy, lumbar region: Secondary | ICD-10-CM | POA: Diagnosis not present

## 2017-04-18 DIAGNOSIS — M9903 Segmental and somatic dysfunction of lumbar region: Secondary | ICD-10-CM | POA: Diagnosis not present

## 2017-04-25 DIAGNOSIS — M9905 Segmental and somatic dysfunction of pelvic region: Secondary | ICD-10-CM | POA: Diagnosis not present

## 2017-04-25 DIAGNOSIS — M9904 Segmental and somatic dysfunction of sacral region: Secondary | ICD-10-CM | POA: Diagnosis not present

## 2017-04-25 DIAGNOSIS — M9903 Segmental and somatic dysfunction of lumbar region: Secondary | ICD-10-CM | POA: Diagnosis not present

## 2017-04-25 DIAGNOSIS — M5416 Radiculopathy, lumbar region: Secondary | ICD-10-CM | POA: Diagnosis not present

## 2017-05-02 DIAGNOSIS — M9903 Segmental and somatic dysfunction of lumbar region: Secondary | ICD-10-CM | POA: Diagnosis not present

## 2017-05-02 DIAGNOSIS — M9905 Segmental and somatic dysfunction of pelvic region: Secondary | ICD-10-CM | POA: Diagnosis not present

## 2017-05-02 DIAGNOSIS — M9904 Segmental and somatic dysfunction of sacral region: Secondary | ICD-10-CM | POA: Diagnosis not present

## 2017-05-02 DIAGNOSIS — M5416 Radiculopathy, lumbar region: Secondary | ICD-10-CM | POA: Diagnosis not present

## 2017-05-08 DIAGNOSIS — F330 Major depressive disorder, recurrent, mild: Secondary | ICD-10-CM | POA: Diagnosis not present

## 2017-05-09 DIAGNOSIS — M9905 Segmental and somatic dysfunction of pelvic region: Secondary | ICD-10-CM | POA: Diagnosis not present

## 2017-05-09 DIAGNOSIS — M9904 Segmental and somatic dysfunction of sacral region: Secondary | ICD-10-CM | POA: Diagnosis not present

## 2017-05-09 DIAGNOSIS — M9903 Segmental and somatic dysfunction of lumbar region: Secondary | ICD-10-CM | POA: Diagnosis not present

## 2017-05-09 DIAGNOSIS — M5416 Radiculopathy, lumbar region: Secondary | ICD-10-CM | POA: Diagnosis not present

## 2017-05-14 DIAGNOSIS — H401133 Primary open-angle glaucoma, bilateral, severe stage: Secondary | ICD-10-CM | POA: Diagnosis not present

## 2017-05-17 DIAGNOSIS — M9903 Segmental and somatic dysfunction of lumbar region: Secondary | ICD-10-CM | POA: Diagnosis not present

## 2017-05-17 DIAGNOSIS — M9905 Segmental and somatic dysfunction of pelvic region: Secondary | ICD-10-CM | POA: Diagnosis not present

## 2017-05-17 DIAGNOSIS — M9904 Segmental and somatic dysfunction of sacral region: Secondary | ICD-10-CM | POA: Diagnosis not present

## 2017-05-17 DIAGNOSIS — M5416 Radiculopathy, lumbar region: Secondary | ICD-10-CM | POA: Diagnosis not present

## 2017-05-22 DIAGNOSIS — F330 Major depressive disorder, recurrent, mild: Secondary | ICD-10-CM | POA: Diagnosis not present

## 2017-05-28 ENCOUNTER — Ambulatory Visit (INDEPENDENT_AMBULATORY_CARE_PROVIDER_SITE_OTHER): Payer: BLUE CROSS/BLUE SHIELD | Admitting: Gastroenterology

## 2017-05-28 ENCOUNTER — Encounter: Payer: Self-pay | Admitting: Gastroenterology

## 2017-05-28 VITALS — BP 120/70 | HR 76 | Ht 72.25 in | Wt 185.2 lb

## 2017-05-28 DIAGNOSIS — K625 Hemorrhage of anus and rectum: Secondary | ICD-10-CM | POA: Diagnosis not present

## 2017-05-28 MED ORDER — SUPREP BOWEL PREP KIT 17.5-3.13-1.6 GM/177ML PO SOLN
1.0000 | ORAL | 0 refills | Status: DC
Start: 2017-05-28 — End: 2018-06-10

## 2017-05-28 NOTE — Progress Notes (Signed)
HPI :  56 y/o male with a history of depression, HLD, migraines, referred her to discuss having a colonoscopy for complaints of rectal bleeding, from Dr. [name].   Records of last colonoscopy received: Done on 08/18/2009 by a Dr. Enrigue Catena at [facility] - 58m transverse polyp - hyperplastic - 2 x 2-327msigmoid polyps - hyperplastic  - hemorrhoids - otherwise normal exam  He reports some scant amount of blood noted on the toilet paper. He thinks this has been ongoing for a long time, he thinks for several years, but not sure if it was going on at the time of his last colonoscopy. It is mild and sporadic, perhaps every few months, or so. No constipation. He usually has about 2 BMs per day, pretty regular form. No straining routinely, he does have straining rarely. No diarrhea. No abdominal pains. No perianal pains or rectal pain. He endorses being very anxious about these symptoms and hoping to have a colonoscopy to ensure nothing concerning is going on.  No FH of colon cancer. Father had prostate cancer. Brother had leukemia and passed away at a young age.   Past Medical History:  Diagnosis Date  . Anxiety   . Depression   . Glaucoma   . Hyperlipidemia   . Hyperplastic colon polyp   . Hypogonadism male 2010  . Kidney stones   . Migraine      Past Surgical History:  Procedure Laterality Date  . PENECTOMY     for blockage in penis  . WISDOM TOOTH EXTRACTION     Family History  Problem Relation Age of Onset  . Arthritis Other   . Stroke Other   . Breast cancer Mother   . Prostate cancer Father   . Skin cancer Sister   . Leukemia Brother   . Skin cancer Brother    Social History  Substance Use Topics  . Smoking status: Never Smoker  . Smokeless tobacco: Never Used  . Alcohol use No   Current Outpatient Prescriptions  Medication Sig Dispense Refill  . fenofibrate 160 MG tablet Take 1 tablet (160 mg total) by mouth daily. 90 tablet 1  .  latanoprost (XALATAN) 0.005 % ophthalmic solution instill 1 drop INTO AFFECTED EYE ONCE EVERY EVENING  0  . rosuvastatin (CRESTOR) 5 MG tablet Take 1 tablet (5 mg total) by mouth at bedtime. 90 tablet 3   No current facility-administered medications for this visit.    Allergies  Allergen Reactions  . Lovaza [Omega-3-Acid Ethyl Esters] Other (See Comments)    GI upset  . Asa [Aspirin] Other (See Comments)    bleeding  . Penicillins Nausea Only    Has patient had a PCN reaction causing immediate rash, facial/tongue/throat swelling, SOB or lightheadedness with hypotension: No Has patient had a PCN reaction causing severe rash involving mucus membranes or skin necrosis: No Has patient had a PCN reaction that required hospitalization No Has patient had a PCN reaction occurring within the last 10 years: No If all of the above answers are "NO", then may proceed with Cephalosporin use.      Review of Systems: All systems reviewed and negative except where noted in HPI.   Lab Results  Component Value Date   WBC 5.4 01/16/2016   HGB 15.0 01/16/2016   HCT 43.7 01/16/2016   MCV 90.3 01/16/2016   PLT 271.0 01/16/2016    Lab Results  Component Value Date   CREATININE 1.24 01/29/2017   BUN 19  01/29/2017   NA 142 01/29/2017   K 4.2 01/29/2017   CL 105 01/29/2017   CO2 31 01/29/2017    Lab Results  Component Value Date   ALT 48 01/29/2017   AST 34 01/29/2017   ALKPHOS 45 01/29/2017   BILITOT 0.6 01/29/2017     Physical Exam: BP 120/70   Pulse 76   Ht 6' 0.25" (1.835 m)   Wt 185 lb 3.2 oz (84 kg)   BMI 24.94 kg/m  Constitutional: Pleasant,well-developed, male in no acute distress. HEENT: Normocephalic and atraumatic. Conjunctivae are normal. No scleral icterus. Neck supple.  Cardiovascular: Normal rate, regular rhythm.  Pulmonary/chest: Effort normal and breath sounds normal. No wheezing, rales or rhonchi. Abdominal: Soft, nondistended, nontender. There are no masses  palpable. No hepatomegaly. Extremities: no edema Lymphadenopathy: No cervical adenopathy noted. Neurological: Alert and oriented to person place and time. Skin: Skin is warm and dry. No rashes noted. Psychiatric: Normal mood and affect. Behavior is normal.   ASSESSMENT AND PLAN: 56 year old male with a history of hemorrhoids on colonoscopy 8 years ago, presenting with intermittent scant rectal bleeding. His symptoms are very likely due to internal hemorrhoids, although he is very anxious about the possibility of something more concerning such as a colon polyp or colon cancer. I reassured him that the polyps removed on his last exam were hyperplastic and do not have any cancerous potential. In the absence of symptoms he would not be due for colonoscopy until 2020, however given his bleeding symptoms, for peace of mind I offered him a colonoscopy to further evaluate. He strongly wished to proceed after discussion of the risks and benefits of colonoscopy. In this light rectal exam was deferred today given colonoscopy is planned. Recommend increasing his fiber intake and minimizing straining in the interim in case his symptoms are due to hemorrhoids. He agreed with the plan  [place] Cellar, MD [facility] Pager [phone]  CC: Janith Lima, MD

## 2017-05-28 NOTE — Patient Instructions (Addendum)
You have been scheduled for a colonoscopy. Please follow written instructions given to you at your visit today.  Please pick up your prep supplies at the pharmacy within the next 1-3 days. If you use inhalers (even only as needed), please bring them with you on the day of your procedure. Your physician has requested that you go to www.startemmi.com and enter the access code given to you at your visit today. This web site gives a general overview about your procedure. However, you should still follow specific instructions given to you by our office regarding your preparation for the procedure.  If you are age 66 or older, your body mass index should be between 23-30. Your Body mass index is 24.94 kg/m. If this is out of the aforementioned range listed, please consider follow up with your Primary Care Provider.  If you are age 82 or younger, your body mass index should be between 19-25. Your Body mass index is 24.94 kg/m. If this is out of the aformentioned range listed, please consider follow up with your Primary Care Provider.   Thank you.

## 2017-06-05 DIAGNOSIS — F330 Major depressive disorder, recurrent, mild: Secondary | ICD-10-CM | POA: Diagnosis not present

## 2017-06-13 DIAGNOSIS — M9903 Segmental and somatic dysfunction of lumbar region: Secondary | ICD-10-CM | POA: Diagnosis not present

## 2017-06-13 DIAGNOSIS — M9905 Segmental and somatic dysfunction of pelvic region: Secondary | ICD-10-CM | POA: Diagnosis not present

## 2017-06-13 DIAGNOSIS — M9904 Segmental and somatic dysfunction of sacral region: Secondary | ICD-10-CM | POA: Diagnosis not present

## 2017-06-13 DIAGNOSIS — M5416 Radiculopathy, lumbar region: Secondary | ICD-10-CM | POA: Diagnosis not present

## 2017-06-24 DIAGNOSIS — M9901 Segmental and somatic dysfunction of cervical region: Secondary | ICD-10-CM | POA: Diagnosis not present

## 2017-06-24 DIAGNOSIS — M9902 Segmental and somatic dysfunction of thoracic region: Secondary | ICD-10-CM | POA: Diagnosis not present

## 2017-06-24 DIAGNOSIS — M542 Cervicalgia: Secondary | ICD-10-CM | POA: Diagnosis not present

## 2017-06-24 DIAGNOSIS — M62838 Other muscle spasm: Secondary | ICD-10-CM | POA: Diagnosis not present

## 2017-06-24 DIAGNOSIS — F330 Major depressive disorder, recurrent, mild: Secondary | ICD-10-CM | POA: Diagnosis not present

## 2017-07-01 DIAGNOSIS — M9902 Segmental and somatic dysfunction of thoracic region: Secondary | ICD-10-CM | POA: Diagnosis not present

## 2017-07-01 DIAGNOSIS — M542 Cervicalgia: Secondary | ICD-10-CM | POA: Diagnosis not present

## 2017-07-01 DIAGNOSIS — M62838 Other muscle spasm: Secondary | ICD-10-CM | POA: Diagnosis not present

## 2017-07-01 DIAGNOSIS — M9901 Segmental and somatic dysfunction of cervical region: Secondary | ICD-10-CM | POA: Diagnosis not present

## 2017-07-10 DIAGNOSIS — F330 Major depressive disorder, recurrent, mild: Secondary | ICD-10-CM | POA: Diagnosis not present

## 2017-07-11 ENCOUNTER — Encounter: Payer: Self-pay | Admitting: Family Medicine

## 2017-07-11 ENCOUNTER — Ambulatory Visit: Payer: BLUE CROSS/BLUE SHIELD | Admitting: Family Medicine

## 2017-07-11 VITALS — BP 104/64 | HR 90 | Temp 99.8°F | Ht 72.25 in | Wt 184.5 lb

## 2017-07-11 DIAGNOSIS — R6889 Other general symptoms and signs: Secondary | ICD-10-CM | POA: Insufficient documentation

## 2017-07-11 DIAGNOSIS — J111 Influenza due to unidentified influenza virus with other respiratory manifestations: Secondary | ICD-10-CM

## 2017-07-11 LAB — POC INFLUENZA A&B (BINAX/QUICKVUE)
INFLUENZA A, POC: NEGATIVE
INFLUENZA B, POC: NEGATIVE

## 2017-07-11 MED ORDER — OSELTAMIVIR PHOSPHATE 75 MG PO CAPS: 75.0000 mg | ORAL_CAPSULE | Freq: Two times a day (BID) | ORAL | 0 refills | Status: AC

## 2017-07-11 NOTE — Assessment & Plan Note (Signed)
Influenza Tamiflu 50m bid x 5 days

## 2017-07-11 NOTE — Progress Notes (Signed)
Subjective:     Patient ID: Andrew Wright, male   DOB: 09-27-1960, 56 y.o.   MRN: [id number]  HPI2-3 day ho head ache, nasal congestion, pnd, cough with elevated temp and sever arthralgias. No rash. No asthma hx. Did have a flu vaccine.    Review of Systems  Constitutional: Positive for activity change and fever.  HENT: Positive for congestion and postnasal drip. Negative for sore throat.   Eyes: Negative.   Respiratory: Positive for cough. Negative for shortness of breath and wheezing.   Cardiovascular: Negative.   Gastrointestinal: Negative.   Musculoskeletal: Positive for arthralgias and myalgias.  Skin: Negative for rash.  Neurological: Positive for headaches. Negative for weakness and numbness.  Psychiatric/Behavioral: Negative.        reports that  has never smoked. he has never used smokeless tobacco. He reports that he does not drink alcohol or use drugs. Objective:   Physical Exam  Constitutional: He is oriented to person, place, and time. He appears well-developed and well-nourished. No distress.  HENT:  Head: Normocephalic and atraumatic.  Right Ear: External ear normal.  Left Ear: External ear normal.  Mouth/Throat: Oropharynx is clear and moist. No oropharyngeal exudate.  Eyes: Conjunctivae are normal. Pupils are equal, round, and reactive to light. Right eye exhibits no discharge. Left eye exhibits no discharge. No scleral icterus.  Neck: Neck supple. No JVD present. No tracheal deviation present. No thyromegaly present.  Cardiovascular: Normal rate, regular rhythm and normal heart sounds.  Pulmonary/Chest: Effort normal and breath sounds normal. No stridor. No respiratory distress. He has no wheezes. He has no rales.  Abdominal: Bowel sounds are normal.  Lymphadenopathy:    He has no cervical adenopathy.  Neurological: He is alert and oriented to person, place, and time.  Skin: Skin is warm and dry. No rash noted.  Psychiatric: He has a normal mood and affect.   Nursing note and vitals reviewed.      Assessment:       influenza Plan:     tamiflu 75m bid for 5 days  Fu if not improving in 3-4 days.

## 2017-07-11 NOTE — Patient Instructions (Addendum)
Influenza  Influenza, Adult Influenza, more commonly known as "the flu," is a viral infection that primarily affects the respiratory tract. The respiratory tract includes organs that help you breathe, such as the lungs, nose, and throat. The flu causes many common cold symptoms, as well as a high fever and body aches. The flu spreads easily from person to person (is contagious). Getting a flu shot (influenza vaccination) every year is the best way to prevent influenza. What are the causes? Influenza is caused by a virus. You can catch the virus by:  Breathing in droplets from an infected person's cough or sneeze.  Touching something that was recently contaminated with the virus and then touching your mouth, nose, or eyes.  What increases the risk? The following factors may make you more likely to get the flu:  Not cleaning your hands frequently with soap and water or alcohol-based hand sanitizer.  Having close contact with many people during cold and flu season.  Touching your mouth, eyes, or nose without washing or sanitizing your hands first.  Not drinking enough fluids or not eating a healthy diet.  Not getting enough sleep or exercise.  Being under a high amount of stress.  Not getting a yearly (annual) flu shot.  You may be at a higher risk of complications from the flu, such as a severe lung infection (pneumonia), if you:  Are over the age of 70.  Are pregnant.  Have a weakened disease-fighting system (immune system). You may have a weakened immune system if you: ? Have HIV or AIDS. ? Are undergoing chemotherapy. ? Aretaking medicines that reduce the activity of (suppress) the immune system.  Have a long-term (chronic) illness, such as heart disease, kidney disease, diabetes, or lung disease.  Have a liver disorder.  Are obese.  Have anemia.  What are the signs or symptoms? Symptoms of this condition typically last 4-10 days and may  include:  Fever.  Chills.  Headache, body aches, or muscle aches.  Sore throat.  Cough.  Runny or congested nose.  Chest discomfort and cough.  Poor appetite.  Weakness or tiredness (fatigue).  Dizziness.  Nausea or vomiting.  How is this diagnosed? This condition may be diagnosed based on your medical history and a physical exam. Your health care provider may do a nose or throat swab test to confirm the diagnosis. How is this treated? If influenza is detected early, you can be treated with antiviral medicine that can reduce the length of your illness and the severity of your symptoms. This medicine may be given by mouth (orally) or through an IV tube that is inserted in one of your veins. The goal of treatment is to relieve symptoms by taking care of yourself at home. This may include taking over-the-counter medicines, drinking plenty of fluids, and adding humidity to the air in your home. In some cases, influenza goes away on its own. Severe influenza or complications from influenza may be treated in a hospital. Follow these instructions at home:  Take over-the-counter and prescription medicines only as told by your health care provider.  Use a cool mist humidifier to add humidity to the air in your home. This can make breathing easier.  Rest as needed.  Drink enough fluid to keep your urine clear or pale yellow.  Cover your mouth and nose when you cough or sneeze.  Wash your hands with soap and water often, especially after you cough or sneeze. If soap and water are not available, use  hand sanitizer.  Stay home from work or school as told by your health care provider. Unless you are visiting your health care provider, try to avoid leaving home until your fever has been gone for 24 hours without the use of medicine.  Keep all follow-up visits as told by your health care provider. This is important. How is this prevented?  Getting an annual flu shot is the best way  to avoid getting the flu. You may get the flu shot in late summer, fall, or winter. Ask your health care provider when you should get your flu shot.  Wash your hands often or use hand sanitizer often.  Avoid contact with people who are sick during cold and flu season.  Eat a healthy diet, drink plenty of fluids, get enough sleep, and exercise regularly. Contact a health care provider if:  You develop new symptoms.  You have: ? Chest pain. ? Diarrhea. ? A fever.  Your cough gets worse.  You produce more mucus.  You feel nauseous or you vomit. Get help right away if:  You develop shortness of breath or difficulty breathing.  Your skin or nails turn a bluish color.  You have severe pain or stiffness in your neck.  You develop a sudden headache or sudden pain in your face or ear.  You cannot stop vomiting. This information is not intended to replace advice given to you by your health care provider. Make sure you discuss any questions you have with your health care provider. Document Released: 08/10/2000 Document Revised: 01/19/2016 Document Reviewed: 06/07/2015 Elsevier Interactive Patient Education  2017 [employer].

## 2017-07-16 ENCOUNTER — Encounter: Payer: Self-pay | Admitting: Internal Medicine

## 2017-07-16 ENCOUNTER — Ambulatory Visit: Payer: BLUE CROSS/BLUE SHIELD | Admitting: Internal Medicine

## 2017-07-16 VITALS — BP 112/70 | HR 94 | Temp 100.5°F | Ht 72.0 in | Wt 184.0 lb

## 2017-07-16 DIAGNOSIS — R509 Fever, unspecified: Secondary | ICD-10-CM | POA: Diagnosis not present

## 2017-07-16 DIAGNOSIS — J989 Respiratory disorder, unspecified: Secondary | ICD-10-CM | POA: Diagnosis not present

## 2017-07-16 DIAGNOSIS — K5900 Constipation, unspecified: Secondary | ICD-10-CM

## 2017-07-16 DIAGNOSIS — M109 Gout, unspecified: Secondary | ICD-10-CM | POA: Diagnosis not present

## 2017-07-16 MED ORDER — METHYLPREDNISOLONE ACETATE 80 MG/ML IJ SUSP
80.0000 mg | Freq: Once | INTRAMUSCULAR | Status: AC
  Administered 2017-07-16: 80 mg via INTRAMUSCULAR

## 2017-07-16 MED ORDER — LEVOFLOXACIN 500 MG PO TABS: 500.0000 mg | ORAL_TABLET | Freq: Every day | ORAL | 0 refills | Status: AC

## 2017-07-16 MED ORDER — HYDROCODONE-ACETAMINOPHEN 5-325 MG PO TABS: 1.0000 | ORAL_TABLET | Freq: Four times a day (QID) | ORAL | 0 refills | Status: DC | PRN

## 2017-07-16 MED ORDER — PREDNISONE 10 MG PO TABS
ORAL_TABLET | ORAL | 0 refills | Status: DC
Start: 2017-07-16 — End: 2017-07-27

## 2017-07-16 NOTE — Assessment & Plan Note (Signed)
Flu neg, not better with tamiflu, hx c/w URI now with lower resp symptoms, o/w uncomplicated but with fever; declines cxr, for levaquin asd as cant r/o atypical bacterial pna given clinical course

## 2017-07-16 NOTE — Assessment & Plan Note (Signed)
Differential joint symptoms includes viral, reactive, bacterial etiologies but most likely related to acute gout , would hold on joint aspiration for now or imaging, ok for depomedrol IM 80 x 1, predpac asd, pain control asd, and ibuprofen he has at home prn after pain some improved again

## 2017-07-16 NOTE — Progress Notes (Signed)
Subjective:    Patient ID: Andrew Wright, male    DOB: [birth year], 56 y.o.   MRN: [id number]  HPI  Here to f/u with c/o 2 days acute onset mod to severe hot, swollen, painful, tender bilat ankles with pain radiating to the feet and knees, without significant erythema or calf pain or swelling.  Also has lesser but similar pain with swelling to right wrist, and only pain to the left wrist.  No prior hx of gout.  Has had somewhat reduced po intake recently due to resp illness not improved per pt with tamiflu, and influenza neg at that visit.  Still with HA, fever, ST, general weakness and malaise, as well new nonprod cough that just wont seem to go away.  Pt denies chest pain, increased sob or doe, wheezing, orthopnea, PND, palpitations, dizziness or syncope.    Pt denies polydipsia, polyuria, but does have some unusual constipation in addition to mild reduced po intake.    No prior of gout or diuretic use Past Medical History:  Diagnosis Date  . Anxiety   . Depression   . Glaucoma   . Hyperlipidemia   . Hyperplastic colon polyp   . Hypogonadism male 2010  . Kidney stones   . Migraine    Past Surgical History:  Procedure Laterality Date  . PENECTOMY     for blockage in penis  . WISDOM TOOTH EXTRACTION      reports that  has never smoked. he has never used smokeless tobacco. He reports that he does not drink alcohol or use drugs. family history includes Arthritis in his other; Breast cancer in his mother; Leukemia in his brother; Prostate cancer in his father; Skin cancer in his brother and sister; Stroke in his other. Allergies  Allergen Reactions  . Lovaza [Omega-3-Acid Ethyl Esters] Other (See Comments)    GI upset  . Asa [Aspirin] Other (See Comments)    bleeding  . Penicillins Nausea Only    Has patient had a PCN reaction causing immediate rash, facial/tongue/throat swelling, SOB or lightheadedness with hypotension: No Has patient had a PCN reaction causing severe rash involving  mucus membranes or skin necrosis: No Has patient had a PCN reaction that required hospitalization No Has patient had a PCN reaction occurring within the last 10 years: No If all of the above answers are "NO", then may proceed with Cephalosporin use.    Current Outpatient Medications on File Prior to Visit  Medication Sig Dispense Refill  . fenofibrate 160 MG tablet Take 1 tablet (160 mg total) by mouth daily. 90 tablet 1  . latanoprost (XALATAN) 0.005 % ophthalmic solution instill 1 drop INTO AFFECTED EYE ONCE EVERY EVENING  0  . oseltamivir (TAMIFLU) 75 MG capsule Take 1 capsule (75 mg total) 2 (two) times daily for 5 days by mouth. 10 capsule 0  . rosuvastatin (CRESTOR) 5 MG tablet Take 1 tablet (5 mg total) by mouth at bedtime. 90 tablet 3  . SUPREP BOWEL PREP KIT 17.5-3.13-1.6 GM/180ML SOLN Take 1 kit by mouth as directed. 354 mL 0   No current facility-administered medications on file prior to visit.    Review of Systems  Constitutional: Negative for other unusual diaphoresis or sweats HENT: Negative for ear discharge or swelling Eyes: Negative for other worsening visual disturbances Respiratory: Negative for stridor or other swelling  Gastrointestinal: Negative for worsening distension or other blood Genitourinary: Negative for retention or other urinary change Musculoskeletal: Negative for other MSK pain or swelling Skin:  Negative for color change or other new lesions Neurological: Negative for worsening tremors and other numbness  Psychiatric/Behavioral: Negative for worsening agitation or other fatigue All other system neg per pt    Objective:   Physical Exam BP 112/70   Pulse 94   Temp (!) 100.5 F (38.1 C) (Oral)   Ht 6' (1.829 m)   Wt 184 lb (83.5 kg)   SpO2 99%   BMI 24.95 kg/m  VS noted,  Constitutional: Pt appears in NAD HENT: Head: NCAT.  Right Ear: External ear normal.  Left Ear: External ear normal.  Eyes: . Pupils are equal, round, and reactive to  light. Conjunctivae and EOM are normal Nose: without d/c or deformity Bilat tm's with mild erythema.  Max sinus areas non tender.  Pharynx with mild erythema, no exudate Neck: Neck supple. Gross normal ROM Cardiovascular: Normal rate and regular rhythm.   Pulmonary/Chest: Effort normal and breath sounds decreased without rales or wheezing.  Bilat ankles with 2-3 + tender swelling with faint overlying erythema without significant red/tender/swelling to feet or legs above the ankles; Right wrist as well with 1+ tender swelling, and left wrist with mild tender, no effusion Neurological: Pt is alert. At baseline orientation, motor grossly intact Skin: Skin is warm. No rashes, other new lesions, no LE edema Psychiatric: Pt behavior is normal without agitation  No other exam findings  POC Influenza A&B (Binax test)  -order: [id number]  Status:  Final result Visible to patient:  No (Not Released) Next appt:  08/14/2017 at 02:00 PM in Gastroenterology Yetta Flock, MD) Dx:  Flu-like symptoms   Ref Range & Units 5d ago 7moago  Influenza A, POC Negative Negative  Negative   Influenza B, POC Negative Negative             Assessment & Plan:

## 2017-07-16 NOTE — Assessment & Plan Note (Signed)
,  most likely I suspect related to reduced po intake and mild lower volume (as is likely the gout as well); for otc miralax prn,  to f/u any worsening symptoms or concerns

## 2017-07-16 NOTE — Patient Instructions (Addendum)
You had the steroid shot today  Please take all new medication as prescribed  - the prednisone, pain medication, and antibiotic  You can also take Delsym OTC for cough, and/or Mucinex (or it's generic off brand) for congestion, and tylenol as needed for pain.  You can also take Miralax OTC 17 mg by mouth per day as needed for constipation  Please continue all other medications as before, and refills have been done if requested.  Please have the pharmacy call with any other refills you may need.  Please keep your appointments with your specialists as you may have planned

## 2017-07-22 DIAGNOSIS — M546 Pain in thoracic spine: Secondary | ICD-10-CM | POA: Diagnosis not present

## 2017-07-22 DIAGNOSIS — M545 Low back pain: Secondary | ICD-10-CM | POA: Diagnosis not present

## 2017-07-22 DIAGNOSIS — M9904 Segmental and somatic dysfunction of sacral region: Secondary | ICD-10-CM | POA: Diagnosis not present

## 2017-07-22 DIAGNOSIS — M9903 Segmental and somatic dysfunction of lumbar region: Secondary | ICD-10-CM | POA: Diagnosis not present

## 2017-07-24 DIAGNOSIS — F330 Major depressive disorder, recurrent, mild: Secondary | ICD-10-CM | POA: Diagnosis not present

## 2017-07-26 ENCOUNTER — Telehealth: Payer: Self-pay | Admitting: Internal Medicine

## 2017-07-26 NOTE — Telephone Encounter (Signed)
Pt placed on conference call with [place] at [facility], and pt offered option of coming to Saturday clinic; pt offered appointment at [place] with Dr Colin Benton; appoint scheduled by [place]; she will cancel Monday's appointment.

## 2017-07-26 NOTE — Telephone Encounter (Signed)
Copied from [place]. Topic: Quick Communication - See Telephone Encounter >> Jul 26, 2017  1:26 PM Hewitt Shorts wrote: CRM for notification. See Telephone encounter for:  Pt has finished the levofloxacine and prednizone and is having a new flare up of the gout  Pt is very anxious to call back to let him know what to do next if a refill can be gotten   [facility] [phone] 07/26/17.

## 2017-07-26 NOTE — Telephone Encounter (Signed)
Called patient regarding request for medication refills; Dr Cathlean Cower had prescribed 3 medicationss for him that worked;pt states that he did not like the hydrocodone because it made him have weird dreams;pt already has an  appointment scheduled for Monday 07/29/17 with Dr Jenny Reichmann but unable to stand due to pain and swelling ; pt would like something to get him through the weekend until his appointment; previously had prednisone, levofloxacin, and hyrocodone-acetaminophen; he "would like something to make the pain and swelling go down"; pt requesting prednisone and levofloxacin; his symptoms returned last night; pt last seen by Dr Jenny Reichmann on 07/16/17; pt can be reached at [phone]; spoke with [place] regarding this issue.

## 2017-07-26 NOTE — Telephone Encounter (Signed)
This has been completed.

## 2017-07-27 ENCOUNTER — Ambulatory Visit: Payer: BLUE CROSS/BLUE SHIELD | Admitting: Family Medicine

## 2017-07-27 ENCOUNTER — Encounter: Payer: Self-pay | Admitting: Family Medicine

## 2017-07-27 VITALS — BP 118/78 | HR 90 | Temp 98.7°F | Ht 72.0 in | Wt 179.0 lb

## 2017-07-27 DIAGNOSIS — M25572 Pain in left ankle and joints of left foot: Secondary | ICD-10-CM

## 2017-07-27 DIAGNOSIS — M25472 Effusion, left ankle: Secondary | ICD-10-CM | POA: Diagnosis not present

## 2017-07-27 DIAGNOSIS — M25571 Pain in right ankle and joints of right foot: Secondary | ICD-10-CM | POA: Diagnosis not present

## 2017-07-27 MED ORDER — PREDNISONE 10 MG PO TABS: ORAL_TABLET | ORAL | 0 refills | Status: DC

## 2017-07-27 NOTE — Progress Notes (Signed)
HPI:  Acute visit for L ankle swelling: -started 3 weeks ago, resolved with prednisone - ? Flu at the time with fever -ankle swelling/pain/redness recurred yesterday and pain so bad he can not walk -mild diffuse achiness intermittently at times as well -swelling and pain much worse today, not able to bear weight  ROS: See pertinent positives and negatives per HPI.  Past Medical History:  Diagnosis Date  . Anxiety   . Depression   . Glaucoma   . Hyperlipidemia   . Hyperplastic colon polyp   . Hypogonadism male 2010  . Kidney stones   . Migraine     Past Surgical History:  Procedure Laterality Date  . PENECTOMY     for blockage in penis  . WISDOM TOOTH EXTRACTION      Family History  Problem Relation Age of Onset  . Arthritis Other   . Stroke Other   . Breast cancer Mother   . Prostate cancer Father   . Skin cancer Sister   . Leukemia Brother   . Skin cancer Brother     Social History   Socioeconomic History  . Marital status: Married    Spouse name: None  . Number of children: None  . Years of education: None  . Highest education level: None  Social Needs  . Financial resource strain: None  . Food insecurity - worry: None  . Food insecurity - inability: None  . Transportation needs - medical: None  . Transportation needs - non-medical: None  Occupational History  . None  Tobacco Use  . Smoking status: Never Smoker  . Smokeless tobacco: Never Used  Substance and Sexual Activity  . Alcohol use: No  . Drug use: No  . Sexual activity: Yes  Other Topics Concern  . None  Social History Narrative   Caffienated drinks-yes   Seat belt use often-yes   Regular Exercise-yes   Smoke alarm in the home-yes   Firearms/guns in the home-no   History of physical abuse-no                    Current Outpatient Medications:  .  fenofibrate 160 MG tablet, Take 1 tablet (160 mg total) by mouth daily., Disp: 90 tablet, Rfl: 1 .  HYDROcodone-acetaminophen  (NORCO/VICODIN) 5-325 MG tablet, Take 1 tablet every 6 (six) hours as needed by mouth for moderate pain., Disp: 25 tablet, Rfl: 0 .  latanoprost (XALATAN) 0.005 % ophthalmic solution, instill 1 drop INTO AFFECTED EYE ONCE EVERY EVENING, Disp: , Rfl: 0 .  rosuvastatin (CRESTOR) 5 MG tablet, Take 1 tablet (5 mg total) by mouth at bedtime., Disp: 90 tablet, Rfl: 3 .  SUPREP BOWEL PREP KIT 17.5-3.13-1.6 GM/180ML SOLN, Take 1 kit by mouth as directed., Disp: 354 mL, Rfl: 0 .  predniSONE (DELTASONE) 10 MG tablet, 73m x 3 days, then 253mx 3 days, then 1061m 3 days, Disp: 21 tablet, Rfl: 0  EXAM:  Vitals:   07/27/17 0914  BP: 118/78  Pulse: 90  Temp: 98.7 F (37.1 C)  SpO2: 99%    Body mass index is 24.28 kg/m.  GENERAL: vitals reviewed and listed above, alert, oriented, appears well hydrated and in no acute distress  HEENT: atraumatic, conjunttiva clear, no obvious abnormalities on inspection of external nose and ears  NECK: no obvious masses on inspection  LUNGS: clear to auscultation bilaterally, no wheezes, rales or rhonchi, good air movement  CV: HRRR, no peripheral edema  MS: limping gait, significant  swelling/erythema/warmth and TTP L ankle joint with resistance to movement 2ndary to pain, ? Mild swelling R ankle, otherwise no other appreciable jt swelling/redness/etc  PSYCH: pleasant and cooperative, no obvious depression or anxiety  ASSESSMENT AND PLAN:  Discussed the following assessment and plan:  Acute left ankle pain  Left ankle swelling  -we discussed possible serious and likely etiologies, workup and treatment, treatment risks and return precautions - gout would be highest on differntial but would need further eval to definitely r/o other and offered to order labs/imaging -after this discussion, Othon opted for  - prednisone rx and they want to possibly see ortho for tap and imaging today as they are wanting definitive dx today -follow up advised next week with  PCP  -of course, we advised Yaviel  to return or notify a doctor immediately if symptoms worsen or persist or new concerns arise. Declined AVS. There are no Patient Instructions on file for this visit.  Colin Benton R., DO

## 2017-07-29 ENCOUNTER — Ambulatory Visit: Payer: BLUE CROSS/BLUE SHIELD | Admitting: Internal Medicine

## 2017-08-01 ENCOUNTER — Emergency Department (HOSPITAL_COMMUNITY): Payer: BLUE CROSS/BLUE SHIELD

## 2017-08-01 ENCOUNTER — Ambulatory Visit: Payer: BLUE CROSS/BLUE SHIELD | Admitting: Internal Medicine

## 2017-08-01 ENCOUNTER — Encounter: Payer: Self-pay | Admitting: Internal Medicine

## 2017-08-01 ENCOUNTER — Encounter (HOSPITAL_COMMUNITY): Payer: Self-pay | Admitting: Emergency Medicine

## 2017-08-01 ENCOUNTER — Ambulatory Visit (INDEPENDENT_AMBULATORY_CARE_PROVIDER_SITE_OTHER)
Admission: RE | Admit: 2017-08-01 | Discharge: 2017-08-01 | Disposition: A | Discharge status: Home / Self Care | Payer: BLUE CROSS/BLUE SHIELD | Source: Ambulatory Visit | Attending: Internal Medicine | Admitting: Internal Medicine

## 2017-08-01 ENCOUNTER — Other Ambulatory Visit (INDEPENDENT_AMBULATORY_CARE_PROVIDER_SITE_OTHER): Payer: BLUE CROSS/BLUE SHIELD

## 2017-08-01 ENCOUNTER — Emergency Department (HOSPITAL_COMMUNITY)
Admission: EM | Admit: 2017-08-01 | Discharge: 2017-08-01 | Disposition: A | Discharge status: Home / Self Care | Payer: BLUE CROSS/BLUE SHIELD | Attending: Emergency Medicine | Admitting: Emergency Medicine

## 2017-08-01 VITALS — BP 130/70 | HR 99 | Temp 99.3°F | Resp 16 | Ht 72.0 in | Wt 183.8 lb

## 2017-08-01 DIAGNOSIS — R2242 Localized swelling, mass and lump, left lower limb: Secondary | ICD-10-CM | POA: Insufficient documentation

## 2017-08-01 DIAGNOSIS — R509 Fever, unspecified: Secondary | ICD-10-CM

## 2017-08-01 DIAGNOSIS — M02372 Reiter's disease, left ankle and foot: Secondary | ICD-10-CM

## 2017-08-01 DIAGNOSIS — M25472 Effusion, left ankle: Secondary | ICD-10-CM

## 2017-08-01 DIAGNOSIS — M25572 Pain in left ankle and joints of left foot: Secondary | ICD-10-CM | POA: Diagnosis not present

## 2017-08-01 DIAGNOSIS — R109 Unspecified abdominal pain: Secondary | ICD-10-CM | POA: Diagnosis not present

## 2017-08-01 DIAGNOSIS — M25571 Pain in right ankle and joints of right foot: Secondary | ICD-10-CM | POA: Diagnosis not present

## 2017-08-01 DIAGNOSIS — R05 Cough: Secondary | ICD-10-CM

## 2017-08-01 DIAGNOSIS — R059 Cough, unspecified: Secondary | ICD-10-CM | POA: Insufficient documentation

## 2017-08-01 DIAGNOSIS — R600 Localized edema: Secondary | ICD-10-CM | POA: Diagnosis not present

## 2017-08-01 DIAGNOSIS — R10A2 Flank pain, left side: Secondary | ICD-10-CM

## 2017-08-01 DIAGNOSIS — R3129 Other microscopic hematuria: Secondary | ICD-10-CM

## 2017-08-01 DIAGNOSIS — Z79899 Other long term (current) drug therapy: Secondary | ICD-10-CM | POA: Diagnosis not present

## 2017-08-01 DIAGNOSIS — M7989 Other specified soft tissue disorders: Secondary | ICD-10-CM | POA: Diagnosis not present

## 2017-08-01 DIAGNOSIS — M0230 Reiter's disease, unspecified site: Secondary | ICD-10-CM | POA: Insufficient documentation

## 2017-08-01 LAB — CBC WITH DIFFERENTIAL/PLATELET
BASOS ABS: 0.1 10*3/uL (ref 0.0–0.1)
Basophils Relative: 0.6 % (ref 0.0–3.0)
EOS ABS: 0.1 10*3/uL (ref 0.0–0.7)
Eosinophils Relative: 0.6 % (ref 0.0–5.0)
HEMATOCRIT: 37.5 % — AB (ref 39.0–52.0)
Hemoglobin: 12.7 g/dL — ABNORMAL LOW (ref 13.0–17.0)
Lymphs Abs: 0.6 10*3/uL — ABNORMAL LOW (ref 0.7–4.0)
MCHC: 33.9 g/dL (ref 30.0–36.0)
MCV: 90.9 fl (ref 78.0–100.0)
MONO ABS: 0.9 10*3/uL (ref 0.1–1.0)
Monocytes Relative: 8.5 % (ref 3.0–12.0)
NEUTROS ABS: 9 10*3/uL — AB (ref 1.4–7.7)
NEUTROS PCT: 84.9 % — AB (ref 43.0–77.0)
PLATELETS: 354 10*3/uL (ref 150.0–400.0)
RBC: 4.13 Mil/uL — ABNORMAL LOW (ref 4.22–5.81)
RDW: 13.3 % (ref 11.5–15.5)
WBC: 10.6 10*3/uL — ABNORMAL HIGH (ref 4.0–10.5)

## 2017-08-01 LAB — URINALYSIS, ROUTINE W REFLEX MICROSCOPIC
BILIRUBIN URINE: NEGATIVE
KETONES UR: NEGATIVE
LEUKOCYTES UA: NEGATIVE
Nitrite: NEGATIVE
Specific Gravity, Urine: 1.02 (ref 1.000–1.030)
TOTAL PROTEIN, URINE-UPE24: NEGATIVE
UROBILINOGEN UA: 0.2 (ref 0.0–1.0)
Urine Glucose: NEGATIVE
pH: 7.5 (ref 5.0–8.0)

## 2017-08-01 LAB — COMPREHENSIVE METABOLIC PANEL
ALT: 13 U/L (ref 0–53)
AST: 14 U/L (ref 0–37)
Albumin: 4 g/dL (ref 3.5–5.2)
Alkaline Phosphatase: 46 U/L (ref 39–117)
BILIRUBIN TOTAL: 0.6 mg/dL (ref 0.2–1.2)
BUN: 20 mg/dL (ref 6–23)
CALCIUM: 9.4 mg/dL (ref 8.4–10.5)
CHLORIDE: 101 meq/L (ref 96–112)
CO2: 28 meq/L (ref 19–32)
CREATININE: 1.02 mg/dL (ref 0.40–1.50)
GFR: 79.81 mL/min (ref >60.00)
GLUCOSE: 108 mg/dL — AB (ref 70–99)
Potassium: 4.4 mEq/L (ref 3.5–5.1)
SODIUM: 139 meq/L (ref 135–145)
Total Protein: 7.3 g/dL (ref 6.0–8.3)

## 2017-08-01 LAB — URIC ACID: Uric Acid, Serum: 4.3 mg/dL (ref 4.0–7.8)

## 2017-08-01 LAB — C-REACTIVE PROTEIN: CRP: 18.4 mg/dL (ref 0.5–20.0)

## 2017-08-01 LAB — SEDIMENTATION RATE: Sed Rate: 85 mm/hr — ABNORMAL HIGH (ref 0–20)

## 2017-08-01 MED ORDER — DOXYCYCLINE HYCLATE 100 MG PO CAPS: 100.0000 mg | ORAL_CAPSULE | Freq: Two times a day (BID) | ORAL | 0 refills | Status: AC

## 2017-08-01 MED ORDER — LORAZEPAM 2 MG/ML IJ SOLN
1.0000 mg | Freq: Once | INTRAMUSCULAR | Status: AC | PRN
  Administered 2017-08-01: 1 mg via INTRAVENOUS
  Filled 2017-08-01: qty 1

## 2017-08-01 MED ORDER — GADOBENATE DIMEGLUMINE 529 MG/ML IV SOLN
20.0000 mL | Freq: Once | INTRAVENOUS | Status: AC | PRN
  Administered 2017-08-01: 18 mL via INTRAVENOUS

## 2017-08-01 MED ORDER — DOXYCYCLINE HYCLATE 100 MG PO TABS
100.0000 mg | ORAL_TABLET | Freq: Once | ORAL | Status: AC
  Administered 2017-08-01: 100 mg via ORAL
  Filled 2017-08-01: qty 1

## 2017-08-01 MED ORDER — CEPHALEXIN 500 MG PO CAPS: 500.0000 mg | ORAL_CAPSULE | Freq: Once | ORAL | Status: DC

## 2017-08-01 NOTE — ED Notes (Signed)
Per PCP, sending over for increased ankle pain and swelling-states fever and elevated HR

## 2017-08-01 NOTE — Progress Notes (Signed)
Subjective:  Patient ID: Keyvon Herter, male    DOB: 05/08/1961  Age: 56 y.o. MRN: [id number]  CC: Ankle Pain   HPI Calob Baskette presents for worsening pain, redness, swelling in the left ankle with fever and chills.  He tells me he saw an orthopedic surgeon about 4 days ago and tells me that a plain film was positive for swelling and he was told this was probably gout.  The orthopedic surgeon (Dr Gladstone Lighter) recommended prednisone but he has not taken it yet.  He was also started on colchicine but says that is not helped much.  Prior to that, the over the last 3 or 4 weeks he has seen several other doctors.  About 3 or 4 weeks ago this all started with an upper respiratory infection that was treated with Tamiflu.  After that he developed migratory arthralgias in his hips, ankles, and wrists.  He was eventually seen by another provider and a course of steroids and Levaquin were offered.  That combination helped for about 4 days but then the symptoms came back in the left ankle and were more severe.  5 days ago he was seen by an urgent care doctor and it was recommended that he take prednisone but again he did not take that course of prednisone either.  He tells me he has not taken prednisone for the last week.  He tried hydrocodone for the pain but it caused severe hallucinations.  The orthopedic surgeon also prescribed indomethacin but it has not helped.  He also complains of intermittent pain in his left flank that radiates to his left testicle.  Has a history of kidney stones.  Outpatient Medications Prior to Visit  Medication Sig Dispense Refill  . Colchicine 0.6 MG CAPS Take 1 capsule by mouth daily.  0  . fenofibrate 160 MG tablet Take 1 tablet (160 mg total) by mouth daily. 90 tablet 1  . indomethacin (INDOCIN) 25 MG capsule take 2 capsules by mouth every 12 hours take with food  0  . latanoprost (XALATAN) 0.005 % ophthalmic solution instill 1 drop INTO AFFECTED EYE ONCE EVERY EVENING  0  .  rosuvastatin (CRESTOR) 5 MG tablet Take 1 tablet (5 mg total) by mouth at bedtime. 90 tablet 3  . SUPREP BOWEL PREP KIT 17.5-3.13-1.6 GM/180ML SOLN Take 1 kit by mouth as directed. 354 mL 0  . HYDROcodone-acetaminophen (NORCO/VICODIN) 5-325 MG tablet Take 1 tablet every 6 (six) hours as needed by mouth for moderate pain. (Patient not taking: Reported on 08/01/2017) 25 tablet 0  . predniSONE (DELTASONE) 10 MG tablet 47m x 3 days, then 263mx 3 days, then 1058m 3 days (Patient not taking: Reported on 08/01/2017) 21 tablet 0   No facility-administered medications prior to visit.     ROS Review of Systems  Constitutional: Positive for chills, fatigue and fever. Negative for activity change, appetite change, diaphoresis and unexpected weight change.  HENT: Negative.   Eyes: Negative for visual disturbance.  Respiratory: Positive for cough. Negative for chest tightness, shortness of breath and wheezing.        Is a 2-week history of nonproductive cough  Cardiovascular: Negative for chest pain, palpitations and leg swelling.  Gastrointestinal: Negative for abdominal pain, diarrhea, nausea and vomiting.  Endocrine: Negative.   Genitourinary: Positive for flank pain and testicular pain. Negative for difficulty urinating, dysuria, frequency, hematuria, scrotal swelling and urgency.  Musculoskeletal: Positive for arthralgias. Negative for myalgias.  Skin: Positive for color change. Negative for  rash.  Allergic/Immunologic: Negative.   Neurological: Negative.  Negative for dizziness.  Hematological: Negative for adenopathy. Does not bruise/bleed easily.  Psychiatric/Behavioral: Negative.     Objective:  BP 130/70 (BP Location: Left Arm, Patient Position: Sitting, Cuff Size: Large)   Pulse 99   Temp 99.3 F (37.4 C) (Oral)   Ht 6' (1.829 m)   Wt 183 lb 12 oz (83.3 kg)   SpO2 98%   BMI 24.92 kg/m   BP Readings from Last 3 Encounters:  08/01/17 130/70  07/27/17 118/78  07/16/17 112/70     Wt Readings from Last 3 Encounters:  08/01/17 183 lb 12 oz (83.3 kg)  07/27/17 179 lb (81.2 kg)  07/16/17 184 lb (83.5 kg)    Physical Exam  Constitutional: He is oriented to person, place, and time. No distress.  HENT:  Mouth/Throat: Oropharynx is clear and moist. No oropharyngeal exudate.  Eyes: Conjunctivae are normal. Right eye exhibits no discharge. Left eye exhibits no discharge. No scleral icterus.  Neck: Normal range of motion. Neck supple. No JVD present. No thyromegaly present.  Cardiovascular: Normal rate and regular rhythm.  No murmur heard. Pulmonary/Chest: Effort normal and breath sounds normal. He has no wheezes. He has no rales.  Abdominal: Soft. Bowel sounds are normal. He exhibits no distension and no mass. There is no tenderness. There is no guarding.  Musculoskeletal: He exhibits no edema or deformity.       Left ankle: He exhibits decreased range of motion and swelling. Tenderness.  There is erythema, swelling, and warmth that wraps around the ankle and extends onto the dorsum of the left foot.  Lymphadenopathy:    He has no cervical adenopathy.  Neurological: He is oriented to person, place, and time.  Skin: Skin is warm. He is not diaphoretic. No erythema.  Vitals reviewed.   Lab Results  Component Value Date   WBC 10.6 (H) 08/01/2017   HGB 12.7 (L) 08/01/2017   HCT 37.5 (L) 08/01/2017   PLT 354.0 08/01/2017   GLUCOSE 108 (H) 08/01/2017   CHOL 131 03/07/2017   TRIG 78.0 03/07/2017   HDL 45.70 03/07/2017   LDLDIRECT 115.0 01/16/2016   LDLCALC 69 03/07/2017   ALT 13 08/01/2017   AST 14 08/01/2017   NA 139 08/01/2017   K 4.4 08/01/2017   CL 101 08/01/2017   CREATININE 1.02 08/01/2017   BUN 20 08/01/2017   CO2 28 08/01/2017   TSH 1.42 01/16/2016   PSA 3.92 03/07/2017    Dg Chest 2 View  Result Date: 09/18/2015 CLINICAL DATA:  Chest and neck pain after doing pushups 2 days ago. EXAM: CHEST  2 VIEW COMPARISON:  None. FINDINGS: The cardiac  silhouette, mediastinal and hilar contours are normal. The lungs are clear. No pleural effusion or pneumothorax. The bony thorax is intact. IMPRESSION: No acute cardiopulmonary findings. Electronically Signed   By: Marijo Sanes M.D.   On: 09/18/2015 19:35    Assessment & Plan:   Masaichi was seen today for ankle pain.  Diagnoses and all orders for this visit:  Reactive arthritis of left ankle ([place])- He has pain redness and swelling in his left ankle that has not resolved after being treated by 3 different physicians.  He responded briefly to steroids and Levaquin.  He has not responded to colchicine and anti-inflammatories.  His uric acid level today is low.  He is not taking steroids and despite that has an elevated white cell count and his sed rate is elevated at  85, he also has a low-grade fever.  I am concerned he has a septic ankle.  I have tried to reach his orthopedic surgeon but is not in town.  The office receptionist said that she would leave a message with his PA and they would call me back to see if they could see him today to get a stat MRI done and/or to tap the joint to see if they could obtain some fluid from the joint to screen for bacterial infection and gout.  If they are not able to do that today then I will recommend that he be seen in the ED to start IV antibiotics since he has not responded to outpatient fluoroquinolones.  He may need to receive IV Rocephin and vancomycin.  Labs are pending to screen for Lyme disease and inflammatory arthritides. -     Comprehensive metabolic panel; Future -     CBC with Differential/Platelet; Future -     B. burgdorfi antibodies by WB; Future -     ANA; Future -     Sedimentation rate; Future -     Urinalysis, Routine w reflex microscopic; Future -     C-reactive protein; Future -     Rheumatoid factor; Future -     Cyclic citrul peptide antibody, IgG; Future -     Uric acid; Future  Fever, unspecified fever cause- his chest x-ray is  negative for infection. See above. -     Comprehensive metabolic panel; Future -     CBC with Differential/Platelet; Future -     B. burgdorfi antibodies by WB; Future -     ANA; Future -     Sedimentation rate; Future -     Urinalysis, Routine w reflex microscopic; Future -     C-reactive protein; Future -     Rheumatoid factor; Future -     Cyclic citrul peptide antibody, IgG; Future -     DG Chest 2 View; Future -     Uric acid; Future  Cough-his exam and chest x-ray are normal.  This is consistent with a post URI cough. -     DG Chest 2 View; Future   I am having [name] maintain his rosuvastatin, fenofibrate, latanoprost, SUPREP BOWEL PREP KIT, HYDROcodone-acetaminophen, predniSONE, indomethacin, and Colchicine.  No orders of the defined types were placed in this encounter.    Follow-up: No Follow-up on file.  [name], MD

## 2017-08-01 NOTE — ED Provider Notes (Signed)
[facility] DEPT Provider Note   CSN: [id number] Arrival date & time: 08/01/17  1347     History   Chief Complaint Chief Complaint  Patient presents with  . Ankle Pain    HPI Andrew Wright is a 56 y.o. male with a h/o of HLD, migraine, and nephrolithiasis who presents to the emergency department with a chief complaint of left ankle achiness and nonproductive cough that began on 11/15.  He also reports associated low-grade fevers, Tmax 100.5, night sweats, and chills over the last 3 weeks and one week of a constant, waxing and waning HA.  He denies chest pain, shortness of breath, abdominal pain, or N/V/D. No recent trauma or injury.  He also complains of intermittent migratory pain and has bilateral hips and knees over the last three weeks.   He describes the left ankle pain as waxing and waning, sharp and shooting, that is worse than pins and needles with associated mild warmth and edema. He states that the pain is worse in the posterior and and the right ankle also has pain, but not redness or warmth.   He reports that he was feeling achy 'all over' and was seen by his PCP and tested for the flu. His his results were negative, but he was treated with a course of Tamiflu.   He reports that his symptoms do not improve and the swelling in his ankle worsened so he was seen again on 11/20 and was treated for acute gout with DepoMedrol IM, hydrocodone, and a prednisone pac. He was also complaining of lower respiratory symptoms and was discharged with a course of levofloxacin. He took two tablet of the hydrocodone then stopped the medication due to "seeing little purple men."  He was seen again on 12/1 by Dr. Maudie Mercury for worsening redness and swelling so he was referred to Dr. Gladstone Lighter with Orthopaedics. He also had a fever of 100.5. He reports that he had an X-ray at the orthopaedics office discharged with a course of indomethacin, colchicine, and a second course of  prednisone.  He reports that his symptoms improved over the next 3 days and he was able to bear weight on the foot and go to the gym.   However, the pain redness, and swelling in the left ankle continued to worsen. He reports that over the last few days that he has been laying in bed because the pain is so severe.  He reports that it is taken at least an hour for him to get up and get ready for him to go to work and to his appointments due to the pain.  He was evaluated again this morning by his PCP who referred him to the emergency department for further work up, including an arthrocentesis or MRI, and evaluation for possible reactive arthritis of the left ankle. He has a low uric acid level at his PCP and a low grade fever of 99.   He also complained of sharp pain in his testicle this AM that "felt like one of his kidney stones, earlier today, which has since resolved.    Past medical history includes nephrolithiasis and hyperlipidemia.  No history or family history of autoimmune disease.  No history of gout.  Daily medications include fenofibrate, Crestor, and drops for glaucoma. No recent travel outside the country. He   The history is provided by the patient. No language interpreter was used.    Past Medical History:  Diagnosis Date  . Anxiety   .  Depression   . Glaucoma   . Hyperlipidemia   . Hyperplastic colon polyp   . Hypogonadism male 2010  . Kidney stones   . Migraine     Patient Active Problem List   Diagnosis Date Noted  . Reactive arthritis of left ankle ([place]) 08/01/2017  . Fever 08/01/2017  . Cough 08/01/2017  . Acute left flank pain 08/01/2017  . Other microscopic hematuria 08/01/2017  . Acute gouty arthritis 07/16/2017  . Constipation 07/16/2017  . PSA elevation 01/30/2017  . Mild episode of recurrent major depressive disorder ([place]) 01/30/2017  . Hypertriglyceridemia 11/26/2014  . Dyslipidemia, goal LDL below 130 12/24/2011  . Routine general medical  examination at a health care facility 11/05/2011  . Hypogonadism male 11/05/2011    Past Surgical History:  Procedure Laterality Date  . PENECTOMY     for blockage in penis  . WISDOM TOOTH EXTRACTION         Home Medications    Prior to Admission medications   Medication Sig Start Date End Date Taking? Authorizing Provider  Colchicine 0.6 MG CAPS Take 1 capsule by mouth daily. 07/27/17  Yes [provider]  fenofibrate 160 MG tablet Take 1 tablet (160 mg total) by mouth daily. 02/21/17  Yes Janith Lima, MD  indomethacin (INDOCIN) 25 MG capsule take 2 capsules by mouth every 12 hours take with food 07/27/17  Yes [provider]  latanoprost (XALATAN) 0.005 % ophthalmic solution instill 1 drop INTO AFFECTED EYE ONCE EVERY EVENING 01/10/17  Yes [provider]  rosuvastatin (CRESTOR) 5 MG tablet Take 1 tablet (5 mg total) by mouth at bedtime. 01/30/17  Yes Janith Lima, MD  doxycycline (VIBRAMYCIN) 100 MG capsule Take 1 capsule (100 mg total) by mouth 2 (two) times daily for 7 days. 08/01/17 08/08/17  McDonald, Mia A, PA-C  SUPREP BOWEL PREP KIT 17.5-3.13-1.6 GM/180ML SOLN Take 1 kit by mouth as directed. 05/28/17   Armbruster, [name], MD    Family History Family History  Problem Relation Age of Onset  . Arthritis Other   . Stroke Other   . Breast cancer Mother   . Prostate cancer Father   . Skin cancer Sister   . Leukemia Brother   . Skin cancer Brother     Social History Social History   Tobacco Use  . Smoking status: Never Smoker  . Smokeless tobacco: Never Used  Substance Use Topics  . Alcohol use: No  . Drug use: No     Allergies   Lovaza [omega-3-acid ethyl esters]; Asa [aspirin]; Hydrocodone; and Penicillins   Review of Systems Review of Systems  Constitutional: Positive for chills and fever. Negative for activity change.  HENT: Negative for congestion, sinus pressure and sinus pain.   Eyes: Negative for visual disturbance.    Respiratory: Positive for cough. Negative for shortness of breath.   Cardiovascular: Negative for chest pain.  Gastrointestinal: Negative for abdominal pain, diarrhea, nausea and vomiting.  Genitourinary: Positive for testicular pain (resolved). Negative for dysuria, flank pain, frequency and penile pain.  Musculoskeletal: Positive for arthralgias, gait problem, joint swelling and myalgias. Negative for back pain, neck pain and neck stiffness.  Skin: Negative for rash.  Allergic/Immunologic: Negative for immunocompromised state.  Neurological: Positive for headaches. Negative for seizures, syncope and weakness.  Psychiatric/Behavioral: Negative for confusion.   Physical Exam Updated Vital Signs BP 117/73 (BP Location: Right Arm)   Pulse 87   Temp 98.1 F (36.7 C) (Oral)   Resp 18  SpO2 99%   Physical Exam  Constitutional: He appears well-developed and well-nourished. No distress.  HENT:  Head: Normocephalic.  Eyes: Conjunctivae are normal.  Neck: Normal range of motion. Neck supple.  Cardiovascular: Normal rate, regular rhythm, normal heart sounds and intact distal pulses. Exam reveals no gallop and no friction rub.  No murmur heard. Pulmonary/Chest: Effort normal and breath sounds normal. No stridor. No respiratory distress. He has no wheezes. He has no rales. He exhibits no tenderness.  Abdominal: Soft. Bowel sounds are normal. He exhibits no distension and no mass. There is no tenderness. There is no rebound and no guarding. No hernia.  No CVA tenderness bilaterally.  Musculoskeletal: Normal range of motion. He exhibits edema and tenderness. He exhibits no deformity.  Tender to palpation over the bilateral Achilles tendons.  There is diffuse edema surrounding the left ankle with associated erythema and mild warmth.  Full active and passive range of motion of the left ankle.  Increased pain with active range of motion greater than passive range of motion.  Minimal edema noted  diffusely surrounding the right ankle with minimal erythema.  No warmth noted.  Full active and passive range of motion of the right ankle.  Increased pain with active range of motion. Independently moves all digits on both feet.   No tenderness to palpation of the medial or lateral joint line of the bilateral knees.  Full active and passive range of motion of the bilateral knees.  No overlying erythema, edema, or warmth.  No tenderness over the bilateral quadriceps or patellar tendons.  Negative anterior posterior drawer test.  Valgus and varus stress test.  Sensation is intact throughout the bilateral lower extremities.  DP and PT pulses are 2+.  No puncture marks or wounds noted to the bilateral feet.  Neurological: He is alert.  Skin: Skin is warm and dry. Capillary refill takes less than 2 seconds. No rash noted. He is not diaphoretic. There is erythema. No pallor.  Psychiatric: His behavior is normal.  Nursing note and vitals reviewed.          ED Treatments / Results  Labs (all labs ordered are listed, but only abnormal results are displayed) Labs Reviewed - No data to display  EKG  EKG Interpretation None       Radiology Dg Chest 2 View  Result Date: 08/01/2017 CLINICAL DATA:  Dry cough EXAM: CHEST  2 VIEW COMPARISON:  09/18/2015 FINDINGS: Heart and mediastinal contours are within normal limits. No focal opacities or effusions. No acute bony abnormality. IMPRESSION: No active cardiopulmonary disease. Electronically Signed   By: Rolm Baptise M.D.   On: 08/01/2017 11:42   Mr Ankle Left W Wo Contrast  Result Date: 08/01/2017 CLINICAL DATA:  Ankle pain and swelling, query septic arthritis. EXAM: MRI OF THE LEFT ANKLE WITHOUT AND WITH CONTRAST TECHNIQUE: Multiplanar, multisequence MR imaging of the ankle was performed before and after the administration of intravenous contrast. CONTRAST:  39m MULTIHANCE GADOBENATE DIMEGLUMINE 529 MG/ML IV SOLN COMPARISON:  None. FINDINGS:  TENDONS Peroneal: Intact peroneus longus and peroneus brevis tendons. Posteromedial: Insertion of the posterior tibial tendon on a type 2 navicular bone. The flexor hallucis longus and flexor digitorum longus tendons are normal. Anterior: Intact tibialis anterior, extensor hallucis longus and extensor digitorum longus tendons. Achilles: Intact. Plantar Fascia: Intact. LIGAMENTS Lateral: Intact. Medial: Intact. CARTILAGE Ankle Joint: No focal chondral defect or significant chondrosis. No joint effusion, significant synovial proliferation or enhancement. Subtalar Joints/Sinus Tarsi: No joint effusion or  chondral defect. Bones: No subchondral marrow signal abnormalities about the tibiotalar, subtalar and midfoot articulations to suggest septic arthritis. No acute fracture or suspicious osseous lesions. Other: Nonspecific generalized subcutaneous soft tissue edema possibly related to third spacing or cellulitis. IMPRESSION: IMPRESSION 1. No joint effusion, subchondral marrow change nor synovial hyper enhancement to suggest septic arthritis about the ankle joint. 2. Generalized nonspecific subcutaneous soft tissue edema possibly related to third spacing of fluid, venous insufficiency or cellulitis. 3. Intact tendons and ligaments crossing the ankle joint. Electronically Signed   By: Ashley Royalty M.D.   On: 08/01/2017 19:55    Procedures Procedures (including critical care time)  Medications Ordered in ED Medications  doxycycline (VIBRA-TABS) tablet 100 mg (not administered)  LORazepam (ATIVAN) injection 1 mg (1 mg Intravenous Given 08/01/17 1822)  gadobenate dimeglumine (MULTIHANCE) injection 20 mL (18 mLs Intravenous Contrast Given 08/01/17 1904)     Initial Impression / Assessment and Plan / ED Course  I have reviewed the triage vital signs and the nursing notes.  Pertinent labs & imaging results that were available during my care of the patient were reviewed by me and considered in my medical decision  making (see chart for details).     56 year old male presenting with 3 weeks of worsening left ankle edema, erythema, warmth, and pain and right ankle edema and pain with nonproductive cough, fever of Tmax 100.5, night sweats, and chills.  ESR 85. CRP 184. WBC 10.6; neutrophils 84.9%.  Hemodynamically stable and afebrile in the emergency department.  Over the last few weeks, the patient has been treated for acute gout with a course of prednisone, colchicine, and indomethacin.  He was also treated with levofloxacin for questionable atypical pneumonia.  The patient only notes improvement of his symptoms after finishing the course of levofloxacin for approximately 3 days, before pain develops in the bilateral feet.  Could consider tendon involvement given recent fluoroquinolone use.  No tendon rupture on physical exam.  The patient has full range of motion of the bilateral ankles so less suspicious for septic arthritis, but this could also be confined it by recent antibiotic administration.  Unlikely gout as the patient has been treated twice within the last 2 weeks without improvement of his symptoms.  Discussed and evaluated the patient with Dr. Lita Mains, attending physician.  Most of the patient's concerns are in the left ankle; however, he does endorse questionable migratory arthritis of the bilateral joints in the lower extremities.  He also endorses worsening stiffness and pain in the mornings so it is possible this could be an autoimmune or rheumatoid condition.  MRI of the left ankle does not demonstrate septic arthritis.  No joint effusion.  There is subcutaneous soft tissue edema noted that is more consistent with venous insufficiency, third space edema, or cellulitis.  No history of PVD or risk factors. The patient has no history of heart failure and clinically does not appear volume overloaded.  Given the previous improvement with levofloxacin, will treat the patient with antibiotics for cellulitis and  have him follow up with primary care for reevaluation in 3-5 days.  Reviewed Dr. Ronnald Ramp note where he also questioned Lyme disease.  Given this additional concern, we will treat the patient with doxycycline which would cover both Lyme's disease and cellulitis.  If no improvement after treatment, I would consider working the patient up for autoimmune disorder.  Discussed these findings with the patient and his wife who are agreeable with the plan at this time.  Strict  return precautions given.  No acute distress.  The patient is safe for discharge at this time.  Final Clinical Impressions(s) / ED Diagnoses   Final diagnoses:  Left ankle swelling  Acute right ankle pain  Acute left ankle pain    ED Discharge Orders        Ordered    doxycycline (VIBRAMYCIN) 100 MG capsule  2 times daily     08/01/17 2028       Joline Maxcy A, PA-C 08/01/17 2119    Julianne Rice, MD 08/07/17 1003

## 2017-08-01 NOTE — ED Notes (Signed)
PA Mia at bedside

## 2017-08-01 NOTE — Patient Instructions (Signed)
Fever, Adult A fever is an increase in the body's temperature. It is often defined as a temperature of 100 F (38C) or higher. Short mild or moderate fevers often have no long-term effects. They also often do not need treatment. Moderate or high fevers may make you feel uncomfortable. Sometimes, they can also be a sign of a serious illness or disease. The sweating that may happen with repeated fevers or fevers that last a while may also cause you to not have enough fluid in your body (dehydration). You can take your temperature with a thermometer to see if you have a fever. A measured temperature can change with:  Age.  Time of day.  Where the thermometer is placed: ? Mouth (oral). ? Rectum (rectal). ? Ear (tympanic). ? Underarm (axillary). ? Forehead (temporal).  Follow these instructions at home: Pay attention to any changes in your symptoms. Take these actions to help with your condition:  Take over-the-counter and prescription medicines only as told by your doctor. Follow the dosing instructions carefully.  If you were prescribed an antibiotic medicine, take it as told by your doctor. Do not stop taking the antibiotic even if you start to feel better.  Rest as needed.  Drink enough fluid to keep your pee (urine) clear or pale yellow.  Sponge yourself or bathe with room-temperature water as needed. This helps to lower your body temperature . Do not use ice water.  Do not wear too many blankets or heavy clothes.  Contact a doctor if:  You throw up (vomit).  You cannot eat or drink without throwing up.  You have watery poop (diarrhea).  It hurts when you pee.  Your symptoms do not get better with treatment.  You have new symptoms.  You feel very weak. Get help right away if:  You are short of breath or have trouble breathing.  You are dizzy or you pass out (faint).  You feel confused.  You have signs of not having enough fluid in your body, such as: ? A dry  mouth. ? Peeing less. ? Looking pale.  You have very bad pain in your belly (abdomen).  You keep throwing up or having water poop.  You have a skin rash.  Your symptoms suddenly get worse. This information is not intended to replace advice given to you by your health care provider. Make sure you discuss any questions you have with your health care provider. Document Released: 05/22/2008 Document Revised: 01/19/2016 Document Reviewed: 10/07/2014 Elsevier Interactive Patient Education  [employer].

## 2017-08-01 NOTE — ED Notes (Signed)
Patient transported to MRI 

## 2017-08-01 NOTE — ED Notes (Signed)
Pt verbalizes understanding of d/c paperwork, follow up instructions, and medications. Pt A/O x4, ambulatory. All belongings with patient upon departure.

## 2017-08-01 NOTE — ED Triage Notes (Signed)
Patient reports he was sent by Dr. Ronnald Ramp to r/o septic joint in bilateral ankles. Reports swelling to bilateral ankles x3 weeks. States he has been treated for flu and gout with no relief. Ambulatory. Swelling, redness, and warmth, noted to bilateral ankles.

## 2017-08-01 NOTE — Discharge Instructions (Signed)
Take one tablet of doxycycline twice daily for the next 7 days starting tomorrow morning. Take 650 mg of Tylenol every 6 hours or 800 mg of ibuprofen with food every 8 hours for pain control.  Please elevate your ankle is above the level of the heart when you are resting.  You can apply cool compresses or ice for 15-20 minutes up to 3-4 times a day.  Please schedule a follow-up appointment with Dr. Ronnald Ramp in the next 3-5 days for reevaluation.  If you develop new or worsening symptoms, including a fever that does not improve with Tylenol, red streaking up the leg, or severe pain, warmth, and swelling to any of your joints, please return to the emergency department for re-evaluation.

## 2017-08-02 LAB — B. BURGDORFI ANTIBODIES BY WB
B burgdorferi IgG Abs (IB): NEGATIVE
B burgdorferi IgM Abs (IB): NEGATIVE
LYME DISEASE 23 KD IGG: NONREACTIVE
LYME DISEASE 28 KD IGG: NONREACTIVE
LYME DISEASE 30 KD IGG: NONREACTIVE
LYME DISEASE 41 KD IGM: NONREACTIVE
LYME DISEASE 45 KD IGG: NONREACTIVE
Lyme Disease 18 kD IgG: NONREACTIVE
Lyme Disease 23 kD IgM: NONREACTIVE
Lyme Disease 39 kD IgG: NONREACTIVE
Lyme Disease 39 kD IgM: NONREACTIVE
Lyme Disease 41 kD IgG: NONREACTIVE
Lyme Disease 58 kD IgG: NONREACTIVE
Lyme Disease 66 kD IgG: NONREACTIVE
Lyme Disease 93 kD IgG: NONREACTIVE

## 2017-08-02 LAB — RHEUMATOID FACTOR

## 2017-08-02 LAB — CYCLIC CITRUL PEPTIDE ANTIBODY, IGG: Cyclic Citrullin Peptide Ab: <16 UNITS

## 2017-08-02 LAB — ANA: ANA: NEGATIVE

## 2017-08-03 ENCOUNTER — Encounter: Payer: Self-pay | Admitting: Internal Medicine

## 2017-08-05 ENCOUNTER — Encounter: Payer: Self-pay | Admitting: Gastroenterology

## 2017-08-08 ENCOUNTER — Other Ambulatory Visit (INDEPENDENT_AMBULATORY_CARE_PROVIDER_SITE_OTHER): Payer: BLUE CROSS/BLUE SHIELD

## 2017-08-08 ENCOUNTER — Ambulatory Visit: Payer: BLUE CROSS/BLUE SHIELD | Admitting: Internal Medicine

## 2017-08-08 ENCOUNTER — Encounter: Payer: Self-pay | Admitting: Internal Medicine

## 2017-08-08 VITALS — BP 110/74 | HR 79 | Temp 99.0°F | Resp 16 | Ht 72.0 in | Wt 181.2 lb

## 2017-08-08 DIAGNOSIS — B9789 Other viral agents as the cause of diseases classified elsewhere: Secondary | ICD-10-CM | POA: Diagnosis not present

## 2017-08-08 DIAGNOSIS — M0230 Reiter's disease, unspecified site: Secondary | ICD-10-CM | POA: Diagnosis not present

## 2017-08-08 DIAGNOSIS — R3129 Other microscopic hematuria: Secondary | ICD-10-CM

## 2017-08-08 DIAGNOSIS — J069 Acute upper respiratory infection, unspecified: Secondary | ICD-10-CM | POA: Diagnosis not present

## 2017-08-08 LAB — SEDIMENTATION RATE: SED RATE: 122 mm/h — AB (ref 0–20)

## 2017-08-08 LAB — CBC WITH DIFFERENTIAL/PLATELET
Basophils Absolute: 0 10*3/uL (ref 0.0–0.1)
Basophils Relative: 0.2 % (ref 0.0–3.0)
EOS PCT: 0.1 % (ref 0.0–5.0)
Eosinophils Absolute: 0 10*3/uL (ref 0.0–0.7)
HCT: 36.2 % — ABNORMAL LOW (ref 39.0–52.0)
Hemoglobin: 11.9 g/dL — ABNORMAL LOW (ref 13.0–17.0)
LYMPHS ABS: 0.8 10*3/uL (ref 0.7–4.0)
Lymphocytes Relative: 10.5 % — ABNORMAL LOW (ref 12.0–46.0)
MCHC: 32.8 g/dL (ref 30.0–36.0)
MCV: 91.4 fl (ref 78.0–100.0)
MONO ABS: 0.3 10*3/uL (ref 0.1–1.0)
MONOS PCT: 4.5 % (ref 3.0–12.0)
NEUTROS ABS: 6.1 10*3/uL (ref 1.4–7.7)
NEUTROS PCT: 84.7 % — AB (ref 43.0–77.0)
PLATELETS: 504 10*3/uL — AB (ref 150.0–400.0)
RBC: 3.96 Mil/uL — ABNORMAL LOW (ref 4.22–5.81)
RDW: 13.8 % (ref 11.5–15.5)
WBC: 7.2 10*3/uL (ref 4.0–10.5)

## 2017-08-08 LAB — URINALYSIS, ROUTINE W REFLEX MICROSCOPIC
BILIRUBIN URINE: NEGATIVE
Hgb urine dipstick: NEGATIVE
Ketones, ur: NEGATIVE
LEUKOCYTES UA: NEGATIVE
NITRITE: NEGATIVE
RBC / HPF: NONE SEEN (ref 0–?)
Specific Gravity, Urine: 1.01 (ref 1.000–1.030)
TOTAL PROTEIN, URINE-UPE24: NEGATIVE
URINE GLUCOSE: NEGATIVE
Urobilinogen, UA: 0.2 (ref 0.0–1.0)
pH: 6.5 (ref 5.0–8.0)

## 2017-08-08 MED ORDER — IBUPROFEN 600 MG PO TABS: 600.0000 mg | ORAL_TABLET | Freq: Four times a day (QID) | ORAL | 1 refills | Status: DC | PRN

## 2017-08-08 MED ORDER — PROMETHAZINE VC/CODEINE 6.25-5-10 MG/5ML PO SYRP: 5.0000 mL | ORAL_SOLUTION | Freq: Four times a day (QID) | ORAL | 0 refills | Status: DC | PRN

## 2017-08-08 NOTE — Patient Instructions (Signed)
Upper Respiratory Infection, Adult Most upper respiratory infections (URIs) are caused by a virus. A URI affects the nose, throat, and upper air passages. The most common type of URI is often called "the common cold." Follow these instructions at home:  Take medicines only as told by your doctor.  Gargle warm saltwater or take cough drops to comfort your throat as told by your doctor.  Use a warm mist humidifier or inhale steam from a shower to increase air moisture. This may make it easier to breathe.  Drink enough fluid to keep your pee (urine) clear or pale yellow.  Eat soups and other clear broths.  Have a healthy diet.  Rest as needed.  Go back to work when your fever is gone or your doctor says it is okay. ? You may need to stay home longer to avoid giving your URI to others. ? You can also wear a face mask and wash your hands often to prevent spread of the virus.  Use your inhaler more if you have asthma.  Do not use any tobacco products, including cigarettes, chewing tobacco, or electronic cigarettes. If you need help quitting, ask your doctor. Contact a doctor if:  You are getting worse, not better.  Your symptoms are not helped by medicine.  You have chills.  You are getting more short of breath.  You have brown or red mucus.  You have yellow or brown discharge from your nose.  You have pain in your face, especially when you bend forward.  You have a fever.  You have puffy (swollen) neck glands.  You have pain while swallowing.  You have white areas in the back of your throat. Get help right away if:  You have very bad or constant: ? Headache. ? Ear pain. ? Pain in your forehead, behind your eyes, and over your cheekbones (sinus pain). ? Chest pain.  You have long-lasting (chronic) lung disease and any of the following: ? Wheezing. ? Long-lasting cough. ? Coughing up blood. ? A change in your usual mucus.  You have a stiff neck.  You have  changes in your: ? Vision. ? Hearing. ? Thinking. ? Mood. This information is not intended to replace advice given to you by your health care provider. Make sure you discuss any questions you have with your health care provider. Document Released: 01/30/2008 Document Revised: 04/15/2016 Document Reviewed: 11/18/2013 Elsevier Interactive Patient Education  2018 [employer]  

## 2017-08-08 NOTE — Progress Notes (Signed)
Subjective:  Patient ID: Andrew Wright, male    DOB: 27-Mar-1961  Age: 56 y.o. MRN: [id number]  CC: Arthritis   HPI Andrew Wright presents for f/up - 2 days ago he finally started taking the steroids that were previously prescribed by an orthopedic surgeon.  Fortunately, he is starting to feel better.  His morning stiffness has improved.  The pain, redness, and swelling in the left ankle is improving.  He continues to have a nonproductive cough and wants a refill on the prescription cough suppressant that he had from years ago.  He continues to have a low-grade fever but no chills.  He denies rash, abdominal pain, nausea, vomiting, lymphadenopathy, diarrhea, dysuria or hematuria.  He has improving arthralgias in the wrist elbows, and ankles.  He is compliant with the doxycycline that was prescribed in the ED a few days ago.  His rheumatologic testing was negative for rheumatoid factor, ANA, or Lyme dz.  Outpatient Medications Prior to Visit  Medication Sig Dispense Refill  . Colchicine 0.6 MG CAPS Take 1 capsule by mouth daily.  0  . doxycycline (VIBRAMYCIN) 100 MG capsule Take 1 capsule (100 mg total) by mouth 2 (two) times daily for 7 days. 14 capsule 0  . fenofibrate 160 MG tablet Take 1 tablet (160 mg total) by mouth daily. 90 tablet 1  . latanoprost (XALATAN) 0.005 % ophthalmic solution instill 1 drop INTO AFFECTED EYE ONCE EVERY EVENING  0  . rosuvastatin (CRESTOR) 5 MG tablet Take 1 tablet (5 mg total) by mouth at bedtime. 90 tablet 3  . indomethacin (INDOCIN) 25 MG capsule take 2 capsules by mouth every 12 hours take with food  0  . SUPREP BOWEL PREP KIT 17.5-3.13-1.6 GM/180ML SOLN Take 1 kit by mouth as directed. (Patient not taking: Reported on 08/08/2017) 354 mL 0   No facility-administered medications prior to visit.     ROS Review of Systems  Constitutional: Positive for fatigue and fever. Negative for activity change, appetite change, chills, diaphoresis and unexpected weight  change.  HENT: Negative.  Negative for sinus pressure and sore throat.   Eyes: Negative for visual disturbance.  Respiratory: Positive for cough. Negative for chest tightness, shortness of breath, wheezing and stridor.   Cardiovascular: Negative for chest pain, palpitations and leg swelling.  Gastrointestinal: Negative for abdominal pain, constipation, diarrhea, nausea and vomiting.  Endocrine: Negative.   Genitourinary: Negative.  Negative for difficulty urinating, discharge, dysuria, flank pain, genital sores, hematuria, scrotal swelling, testicular pain and urgency.  Musculoskeletal: Positive for arthralgias. Negative for back pain, myalgias and neck pain.  Skin: Negative.  Negative for color change and rash.  Neurological: Negative.  Negative for weakness, light-headedness, numbness and headaches.  Hematological: Negative for adenopathy. Does not bruise/bleed easily.  Psychiatric/Behavioral: Negative.     Objective:  BP 110/74 (BP Location: Left Arm, Patient Position: Sitting, Cuff Size: Normal)   Pulse 79   Temp 99 F (37.2 C) (Oral)   Resp 16   Ht 6' (1.829 m)   Wt 181 lb 4 oz (82.2 kg)   SpO2 98%   BMI 24.58 kg/m   BP Readings from Last 3 Encounters:  08/08/17 110/74  08/01/17 117/73  08/01/17 130/70    Wt Readings from Last 3 Encounters:  08/08/17 181 lb 4 oz (82.2 kg)  08/01/17 183 lb 12 oz (83.3 kg)  07/27/17 179 lb (81.2 kg)    Physical Exam  Constitutional: He is oriented to person, place, and time. No distress.  HENT:  Mouth/Throat: Oropharynx is clear and moist. No oropharyngeal exudate.  Eyes: Conjunctivae are normal. Left eye exhibits no discharge. No scleral icterus.  Neck: Normal range of motion. Neck supple. No JVD present. No thyromegaly present.  Cardiovascular: Normal rate, regular rhythm and normal heart sounds.  No murmur heard. Pulmonary/Chest: Effort normal and breath sounds normal. He has no wheezes. He has no rales.  Abdominal: Soft. Bowel  sounds are normal. He exhibits no mass. There is no hepatosplenomegaly. There is no tenderness. There is no guarding and no CVA tenderness.  Musculoskeletal: Normal range of motion. He exhibits no edema, tenderness or deformity.  There is mild swelling and warmth over the left ankle but the erythema has resolved.  All of the other joints show free range of motion with no tenderness to palpation, swelling, synovitis, or warmth.  Lymphadenopathy:    He has no cervical adenopathy.  Neurological: He is alert and oriented to person, place, and time.  Skin: Skin is warm and dry. No rash noted. He is not diaphoretic. No erythema. No pallor.  Vitals reviewed.   Lab Results  Component Value Date   WBC 7.2 08/08/2017   HGB 11.9 (L) 08/08/2017   HCT 36.2 (L) 08/08/2017   PLT 504.0 (H) 08/08/2017   GLUCOSE 108 (H) 08/01/2017   CHOL 131 03/07/2017   TRIG 78.0 03/07/2017   HDL 45.70 03/07/2017   LDLDIRECT 115.0 01/16/2016   LDLCALC 69 03/07/2017   ALT 13 08/01/2017   AST 14 08/01/2017   NA 139 08/01/2017   K 4.4 08/01/2017   CL 101 08/01/2017   CREATININE 1.02 08/01/2017   BUN 20 08/01/2017   CO2 28 08/01/2017   TSH 1.42 01/16/2016   PSA 3.92 03/07/2017    Dg Chest 2 View  Result Date: 08/01/2017 CLINICAL DATA:  Dry cough EXAM: CHEST  2 VIEW COMPARISON:  09/18/2015 FINDINGS: Heart and mediastinal contours are within normal limits. No focal opacities or effusions. No acute bony abnormality. IMPRESSION: No active cardiopulmonary disease. Electronically Signed   By: Rolm Baptise M.D.   On: 08/01/2017 11:42   Mr Ankle Left W Wo Contrast  Result Date: 08/01/2017 CLINICAL DATA:  Ankle pain and swelling, query septic arthritis. EXAM: MRI OF THE LEFT ANKLE WITHOUT AND WITH CONTRAST TECHNIQUE: Multiplanar, multisequence MR imaging of the ankle was performed before and after the administration of intravenous contrast. CONTRAST:  27m MULTIHANCE GADOBENATE DIMEGLUMINE 529 MG/ML IV SOLN COMPARISON:   None. FINDINGS: TENDONS Peroneal: Intact peroneus longus and peroneus brevis tendons. Posteromedial: Insertion of the posterior tibial tendon on a type 2 navicular bone. The flexor hallucis longus and flexor digitorum longus tendons are normal. Anterior: Intact tibialis anterior, extensor hallucis longus and extensor digitorum longus tendons. Achilles: Intact. Plantar Fascia: Intact. LIGAMENTS Lateral: Intact. Medial: Intact. CARTILAGE Ankle Joint: No focal chondral defect or significant chondrosis. No joint effusion, significant synovial proliferation or enhancement. Subtalar Joints/Sinus Tarsi: No joint effusion or chondral defect. Bones: No subchondral marrow signal abnormalities about the tibiotalar, subtalar and midfoot articulations to suggest septic arthritis. No acute fracture or suspicious osseous lesions. Other: Nonspecific generalized subcutaneous soft tissue edema possibly related to third spacing or cellulitis. IMPRESSION: IMPRESSION 1. No joint effusion, subchondral marrow change nor synovial hyper enhancement to suggest septic arthritis about the ankle joint. 2. Generalized nonspecific subcutaneous soft tissue edema possibly related to third spacing of fluid, venous insufficiency or cellulitis. 3. Intact tendons and ligaments crossing the ankle joint. Electronically Signed   By: DShanon Brow  Randel Pigg M.D.   On: 08/01/2017 19:55    Assessment & Plan:   Ballard was seen today for arthritis.  Diagnoses and all orders for this visit:  Reactive arthritis ([place])- He is improving on steroid therapy.  His symptoms are most consistent with a viral syndrome such as an atypical presentation of still's disease or parvovirus B19 infection.  Rheumatologic titers and tests for Lyme disease have been negative.  He has had a minimal response to doxycycline.  His sed rate remains very high and his anemia has worsened slightly.  He could also have an atypical presentation of Reiter's syndrome.  Will check an HLA-B27  antigen to see if he is at risk for Reiters.  On previous testing there was some hematuria but he has been treated with doxycycline, testing today for gonorrhea chlamydia is negative. -     CBC with Differential/Platelet; Future -     HLA-B27 Antigen; Future -     Sedimentation rate; Future -     ibuprofen (ADVIL,MOTRIN) 600 MG tablet; Take 1 tablet (600 mg total) by mouth every 6 (six) hours as needed.  Other microscopic hematuria- this has resolved -     Urinalysis, Routine w reflex microscopic; Future -     C. trachomatis/N. gonorrhoeae RNA; Future  Viral URI with cough- His recent chest x-ray was negative.  His exam continues to be normal.  Will treat symptomatically with Phenergan with codeine. -     Promethazine-Phenyleph-Codeine (PROMETHAZINE VC/CODEINE) 6.25-5-10 MG/5ML SYRP; Take 5 mLs by mouth 4 (four) times daily as needed.   I have discontinued Gwyndolyn Saxon Runion's indomethacin. I am also having him start on PROMETHAZINE VC/CODEINE and ibuprofen. Additionally, I am having him maintain his rosuvastatin, fenofibrate, latanoprost, SUPREP BOWEL PREP KIT, and Colchicine.  Meds ordered this encounter  Medications  . Promethazine-Phenyleph-Codeine (PROMETHAZINE VC/CODEINE) 6.25-5-10 MG/5ML SYRP    Sig: Take 5 mLs by mouth 4 (four) times daily as needed.    Dispense:  240 mL    Refill:  0  . ibuprofen (ADVIL,MOTRIN) 600 MG tablet    Sig: Take 1 tablet (600 mg total) by mouth every 6 (six) hours as needed.    Dispense:  30 tablet    Refill:  1     Follow-up: Return in about 3 weeks (around 08/29/2017).  [name], MD

## 2017-08-10 LAB — C. TRACHOMATIS/N. GONORRHOEAE RNA
C. TRACHOMATIS RNA, TMA: NOT DETECTED
N. GONORRHOEAE RNA, TMA: NOT DETECTED

## 2017-08-11 ENCOUNTER — Encounter: Payer: Self-pay | Admitting: Internal Medicine

## 2017-08-12 DIAGNOSIS — M9903 Segmental and somatic dysfunction of lumbar region: Secondary | ICD-10-CM | POA: Diagnosis not present

## 2017-08-12 DIAGNOSIS — M545 Low back pain: Secondary | ICD-10-CM | POA: Diagnosis not present

## 2017-08-12 DIAGNOSIS — M546 Pain in thoracic spine: Secondary | ICD-10-CM | POA: Diagnosis not present

## 2017-08-12 DIAGNOSIS — M9904 Segmental and somatic dysfunction of sacral region: Secondary | ICD-10-CM | POA: Diagnosis not present

## 2017-08-12 LAB — HLA-B27 ANTIGEN: HLA-B27 ANTIGEN: NEGATIVE

## 2017-08-13 ENCOUNTER — Other Ambulatory Visit: Payer: Self-pay | Admitting: Internal Medicine

## 2017-08-13 DIAGNOSIS — M0230 Reiter's disease, unspecified site: Secondary | ICD-10-CM

## 2017-08-13 MED ORDER — PREDNISONE 10 MG PO TABS: 30.0000 mg | ORAL_TABLET | Freq: Every day | ORAL | 0 refills | Status: AC

## 2017-08-14 ENCOUNTER — Other Ambulatory Visit: Payer: BLUE CROSS/BLUE SHIELD

## 2017-08-14 ENCOUNTER — Encounter: Payer: BLUE CROSS/BLUE SHIELD | Admitting: Gastroenterology

## 2017-08-21 ENCOUNTER — Other Ambulatory Visit: Payer: Self-pay | Admitting: Internal Medicine

## 2017-08-21 DIAGNOSIS — E781 Pure hyperglyceridemia: Secondary | ICD-10-CM

## 2017-08-21 MED ORDER — FENOFIBRATE 160 MG PO TABS: 160.0000 mg | ORAL_TABLET | Freq: Every day | ORAL | 1 refills | Status: DC

## 2017-08-28 DIAGNOSIS — M546 Pain in thoracic spine: Secondary | ICD-10-CM | POA: Diagnosis not present

## 2017-08-28 DIAGNOSIS — M9902 Segmental and somatic dysfunction of thoracic region: Secondary | ICD-10-CM | POA: Diagnosis not present

## 2017-08-28 DIAGNOSIS — F330 Major depressive disorder, recurrent, mild: Secondary | ICD-10-CM | POA: Diagnosis not present

## 2017-08-28 DIAGNOSIS — M9903 Segmental and somatic dysfunction of lumbar region: Secondary | ICD-10-CM | POA: Diagnosis not present

## 2017-08-28 DIAGNOSIS — M545 Low back pain: Secondary | ICD-10-CM | POA: Diagnosis not present

## 2017-08-29 DIAGNOSIS — M9902 Segmental and somatic dysfunction of thoracic region: Secondary | ICD-10-CM | POA: Diagnosis not present

## 2017-08-29 DIAGNOSIS — M9903 Segmental and somatic dysfunction of lumbar region: Secondary | ICD-10-CM | POA: Diagnosis not present

## 2017-08-29 DIAGNOSIS — M546 Pain in thoracic spine: Secondary | ICD-10-CM | POA: Diagnosis not present

## 2017-08-29 DIAGNOSIS — M545 Low back pain: Secondary | ICD-10-CM | POA: Diagnosis not present

## 2017-09-04 ENCOUNTER — Encounter: Payer: Self-pay | Admitting: Internal Medicine

## 2017-09-04 ENCOUNTER — Other Ambulatory Visit (INDEPENDENT_AMBULATORY_CARE_PROVIDER_SITE_OTHER): Payer: BLUE CROSS/BLUE SHIELD

## 2017-09-04 ENCOUNTER — Ambulatory Visit: Payer: BLUE CROSS/BLUE SHIELD | Admitting: Internal Medicine

## 2017-09-04 VITALS — BP 122/72 | HR 78 | Temp 98.2°F | Resp 16 | Ht 72.0 in | Wt 188.0 lb

## 2017-09-04 DIAGNOSIS — R972 Elevated prostate specific antigen [PSA]: Secondary | ICD-10-CM | POA: Diagnosis not present

## 2017-09-04 DIAGNOSIS — M0230 Reiter's disease, unspecified site: Secondary | ICD-10-CM

## 2017-09-04 DIAGNOSIS — R3129 Other microscopic hematuria: Secondary | ICD-10-CM | POA: Diagnosis not present

## 2017-09-04 DIAGNOSIS — R5383 Other fatigue: Secondary | ICD-10-CM | POA: Diagnosis not present

## 2017-09-04 DIAGNOSIS — Z1211 Encounter for screening for malignant neoplasm of colon: Secondary | ICD-10-CM

## 2017-09-04 LAB — CBC WITH DIFFERENTIAL/PLATELET
BASOS ABS: 0 10*3/uL (ref 0.0–0.1)
Basophils Relative: 0.5 % (ref 0.0–3.0)
EOS PCT: 3.6 % (ref 0.0–5.0)
Eosinophils Absolute: 0.2 10*3/uL (ref 0.0–0.7)
HEMATOCRIT: 37.6 % — AB (ref 39.0–52.0)
Hemoglobin: 12.4 g/dL — ABNORMAL LOW (ref 13.0–17.0)
LYMPHS ABS: 1.1 10*3/uL (ref 0.7–4.0)
LYMPHS PCT: 19.6 % (ref 12.0–46.0)
MCHC: 33.1 g/dL (ref 30.0–36.0)
MCV: 91.5 fl (ref 78.0–100.0)
MONOS PCT: 11.2 % (ref 3.0–12.0)
Monocytes Absolute: 0.6 10*3/uL (ref 0.1–1.0)
NEUTROS ABS: 3.5 10*3/uL (ref 1.4–7.7)
NEUTROS PCT: 65.1 % (ref 43.0–77.0)
PLATELETS: 229 10*3/uL (ref 150.0–400.0)
RBC: 4.11 Mil/uL — ABNORMAL LOW (ref 4.22–5.81)
RDW: 15.6 % — ABNORMAL HIGH (ref 11.5–15.5)
WBC: 5.4 10*3/uL (ref 4.0–10.5)

## 2017-09-04 LAB — URINALYSIS, ROUTINE W REFLEX MICROSCOPIC
BILIRUBIN URINE: NEGATIVE
KETONES UR: NEGATIVE
LEUKOCYTES UA: NEGATIVE
NITRITE: NEGATIVE
Specific Gravity, Urine: 1.02 (ref 1.000–1.030)
TOTAL PROTEIN, URINE-UPE24: NEGATIVE
URINE GLUCOSE: NEGATIVE
UROBILINOGEN UA: 0.2 (ref 0.0–1.0)
WBC, UA: NONE SEEN (ref 0–?)
pH: 6.5 (ref 5.0–8.0)

## 2017-09-04 LAB — COMPREHENSIVE METABOLIC PANEL
ALK PHOS: 52 U/L (ref 39–117)
ALT: 22 U/L (ref 0–53)
AST: 20 U/L (ref 0–37)
Albumin: 4.3 g/dL (ref 3.5–5.2)
BUN: 15 mg/dL (ref 6–23)
CHLORIDE: 105 meq/L (ref 96–112)
CO2: 30 meq/L (ref 19–32)
Calcium: 9 mg/dL (ref 8.4–10.5)
Creatinine, Ser: 1.02 mg/dL (ref 0.40–1.50)
GFR: 79.79 mL/min (ref >60.00)
GLUCOSE: 91 mg/dL (ref 70–99)
POTASSIUM: 3.7 meq/L (ref 3.5–5.1)
Sodium: 143 mEq/L (ref 135–145)
Total Bilirubin: 0.5 mg/dL (ref 0.2–1.2)
Total Protein: 6.5 g/dL (ref 6.0–8.3)

## 2017-09-04 LAB — PSA: PSA: 6.08 ng/mL — ABNORMAL HIGH (ref 0.10–4.00)

## 2017-09-04 LAB — SEDIMENTATION RATE: SED RATE: 32 mm/h — AB (ref 0–20)

## 2017-09-04 NOTE — Patient Instructions (Signed)
Anemia Anemia is a condition in which you do not have enough red blood cells or hemoglobin. Hemoglobin is a substance in red blood cells that carries oxygen. When you do not have enough red blood cells or hemoglobin (are anemic), your body cannot get enough oxygen and your organs may not work properly. As a result, you may feel very tired or have other problems. What are the causes? Common causes of anemia include:  Excessive bleeding. Anemia can be caused by excessive bleeding inside or outside the body, including bleeding from the intestine or from periods in women.  Poor nutrition.  Long-lasting (chronic) kidney, thyroid, and liver disease.  Bone marrow disorders.  Cancer and treatments for cancer.  HIV (human immunodeficiency virus) and AIDS (acquired immunodeficiency syndrome).  Treatments for HIV and AIDS.  Spleen problems.  Blood disorders.  Infections, medicines, and autoimmune disorders that destroy red blood cells.  What are the signs or symptoms? Symptoms of this condition include:  Minor weakness.  Dizziness.  Headache.  Feeling heartbeats that are irregular or faster than normal (palpitations).  Shortness of breath, especially with exercise.  Paleness.  Cold sensitivity.  Indigestion.  Nausea.  Difficulty sleeping.  Difficulty concentrating.  Symptoms may occur suddenly or develop slowly. If your anemia is mild, you may not have symptoms. How is this diagnosed? This condition is diagnosed based on:  Blood tests.  Your medical history.  A physical exam.  Bone marrow biopsy.  Your health care provider may also check your stool (feces) for blood and may do additional testing to look for the cause of your bleeding. You may also have other tests, including:  Imaging tests, such as a CT scan or MRI.  Endoscopy.  Colonoscopy.  How is this treated? Treatment for this condition depends on the cause. If you continue to lose a lot of blood,  you may need to be treated at a hospital. Treatment may include:  Taking supplements of iron, vitamin B12, or folic acid.  Taking a hormone medicine (erythropoietin) that can help to stimulate red blood cell growth.  Having a blood transfusion. This may be needed if you lose a lot of blood.  Making changes to your diet.  Having surgery to remove your spleen.  Follow these instructions at home:  Take over-the-counter and prescription medicines only as told by your health care provider.  Take supplements only as told by your health care provider.  Follow any diet instructions that you were given.  Keep all follow-up visits as told by your health care provider. This is important. Contact a health care provider if:  You develop new bleeding anywhere in the body. Get help right away if:  You are very weak.  You are short of breath.  You have pain in your abdomen or chest.  You are dizzy or feel faint.  You have trouble concentrating.  You have bloody or black, tarry stools.  You vomit repeatedly or you vomit up blood. Summary  Anemia is a condition in which you do not have enough red blood cells or enough of a substance in your red blood cells that carries oxygen (hemoglobin).  Symptoms may occur suddenly or develop slowly.  If your anemia is mild, you may not have symptoms.  This condition is diagnosed with blood tests as well as a medical history and physical exam. Other tests may be needed.  Treatment for this condition depends on the cause of the anemia. This information is not intended to replace advice   given to you by your health care provider. Make sure you discuss any questions you have with your health care provider. Document Released: 09/20/2004 Document Revised: 09/14/2016 Document Reviewed: 09/14/2016 Elsevier Interactive Patient Education  [employer].

## 2017-09-04 NOTE — Progress Notes (Signed)
Subjective:  Patient ID: Andrew Wright, male    DOB: [birth year]  Age: 57 y.o. MRN: [id number]  CC: Anemia   HPI Saleh Ulbrich presents for f/up - He tells me that he feels much better.  He has mild, diffuse, bilateral arthralgias in his large joints and aching in his lower back.  The joint swelling and redness has resolved.  He has completed the course of prednisone.  He is no longer taking ibuprofen.  He does complain of fatigue.  He denies abdominal pain, flank pain, dysuria, or hematuria.  Outpatient Medications Prior to Visit  Medication Sig Dispense Refill  . Colchicine 0.6 MG CAPS Take 1 capsule by mouth daily.  0  . fenofibrate 160 MG tablet Take 1 tablet (160 mg total) by mouth daily. 90 tablet 1  . ibuprofen (ADVIL,MOTRIN) 600 MG tablet Take 1 tablet (600 mg total) by mouth every 6 (six) hours as needed. 30 tablet 1  . latanoprost (XALATAN) 0.005 % ophthalmic solution instill 1 drop INTO AFFECTED EYE ONCE EVERY EVENING  0  . rosuvastatin (CRESTOR) 5 MG tablet Take 1 tablet (5 mg total) by mouth at bedtime. 90 tablet 3  . Promethazine-Phenyleph-Codeine (PROMETHAZINE VC/CODEINE) 6.25-5-10 MG/5ML SYRP Take 5 mLs by mouth 4 (four) times daily as needed. (Patient not taking: Reported on 09/04/2017) 240 mL 0  . SUPREP BOWEL PREP KIT 17.5-3.13-1.6 GM/180ML SOLN Take 1 kit by mouth as directed. (Patient not taking: Reported on 08/08/2017) 354 mL 0   No facility-administered medications prior to visit.     ROS Review of Systems  Constitutional: Positive for fatigue. Negative for appetite change, chills, diaphoresis and fever.  HENT: Negative.   Eyes: Negative for visual disturbance.  Respiratory: Negative for cough, chest tightness, shortness of breath and wheezing.   Cardiovascular: Negative for chest pain, palpitations and leg swelling.  Gastrointestinal: Negative for abdominal pain, blood in stool, constipation, diarrhea, nausea and vomiting.  Endocrine: Negative.   Genitourinary:  Negative.  Negative for difficulty urinating, flank pain, frequency, penile swelling, scrotal swelling, testicular pain and urgency.  Musculoskeletal: Positive for arthralgias and back pain. Negative for myalgias and neck pain.  Skin: Negative.  Negative for color change and rash.  Allergic/Immunologic: Negative.   Neurological: Negative.  Negative for dizziness, weakness and light-headedness.  Hematological: Negative for adenopathy. Does not bruise/bleed easily.  Psychiatric/Behavioral: Negative.     Objective:  BP 122/72 (BP Location: Left Arm, Patient Position: Sitting, Cuff Size: Normal)   Pulse 78   Temp 98.2 F (36.8 C) (Oral)   Resp 16   Ht 6' (1.829 m)   Wt 188 lb (85.3 kg)   SpO2 97%   BMI 25.50 kg/m   BP Readings from Last 3 Encounters:  09/04/17 122/72  08/08/17 110/74  08/01/17 117/73    Wt Readings from Last 3 Encounters:  09/04/17 188 lb (85.3 kg)  08/08/17 181 lb 4 oz (82.2 kg)  08/01/17 183 lb 12 oz (83.3 kg)    Physical Exam  Constitutional: He is oriented to person, place, and time. No distress.  HENT:  Mouth/Throat: Oropharynx is clear and moist. No oropharyngeal exudate.  Eyes: Conjunctivae are normal. Left eye exhibits no discharge. No scleral icterus.  Neck: Normal range of motion. Neck supple. No JVD present. No thyromegaly present.  Cardiovascular: Normal rate, regular rhythm and normal heart sounds.  No murmur heard. Pulmonary/Chest: Effort normal and breath sounds normal. No respiratory distress. He has no wheezes. He has no rales.  Abdominal: Soft. Bowel  sounds are normal. He exhibits no mass. There is no tenderness. There is no guarding.  Musculoskeletal: Normal range of motion. He exhibits no edema, tenderness or deformity.  Left ankle joint shows no erythema, swelling, or tenderness.  All of the other joints are similar with no evidence of pain, swelling, or synovitis.  Lymphadenopathy:    He has no cervical adenopathy.  Neurological: He is  alert and oriented to person, place, and time.  Skin: Skin is warm and dry. No rash noted. He is not diaphoretic. No erythema. No pallor.  Vitals reviewed.   Lab Results  Component Value Date   WBC 5.4 09/04/2017   HGB 12.4 (L) 09/04/2017   HCT 37.6 (L) 09/04/2017   PLT 229.0 09/04/2017   GLUCOSE 91 09/04/2017   CHOL 131 03/07/2017   TRIG 78.0 03/07/2017   HDL 45.70 03/07/2017   LDLDIRECT 115.0 01/16/2016   LDLCALC 69 03/07/2017   ALT 22 09/04/2017   AST 20 09/04/2017   NA 143 09/04/2017   K 3.7 09/04/2017   CL 105 09/04/2017   CREATININE 1.02 09/04/2017   BUN 15 09/04/2017   CO2 30 09/04/2017   TSH 1.69 09/04/2017   PSA 6.08 (H) 09/04/2017    Dg Chest 2 View  Result Date: 08/01/2017 CLINICAL DATA:  Dry cough EXAM: CHEST  2 VIEW COMPARISON:  09/18/2015 FINDINGS: Heart and mediastinal contours are within normal limits. No focal opacities or effusions. No acute bony abnormality. IMPRESSION: No active cardiopulmonary disease. Electronically Signed   By: Rolm Baptise M.D.   On: 08/01/2017 11:42   Mr Ankle Left W Wo Contrast  Result Date: 08/01/2017 CLINICAL DATA:  Ankle pain and swelling, query septic arthritis. EXAM: MRI OF THE LEFT ANKLE WITHOUT AND WITH CONTRAST TECHNIQUE: Multiplanar, multisequence MR imaging of the ankle was performed before and after the administration of intravenous contrast. CONTRAST:  95m MULTIHANCE GADOBENATE DIMEGLUMINE 529 MG/ML IV SOLN COMPARISON:  None. FINDINGS: TENDONS Peroneal: Intact peroneus longus and peroneus brevis tendons. Posteromedial: Insertion of the posterior tibial tendon on a type 2 navicular bone. The flexor hallucis longus and flexor digitorum longus tendons are normal. Anterior: Intact tibialis anterior, extensor hallucis longus and extensor digitorum longus tendons. Achilles: Intact. Plantar Fascia: Intact. LIGAMENTS Lateral: Intact. Medial: Intact. CARTILAGE Ankle Joint: No focal chondral defect or significant chondrosis. No joint  effusion, significant synovial proliferation or enhancement. Subtalar Joints/Sinus Tarsi: No joint effusion or chondral defect. Bones: No subchondral marrow signal abnormalities about the tibiotalar, subtalar and midfoot articulations to suggest septic arthritis. No acute fracture or suspicious osseous lesions. Other: Nonspecific generalized subcutaneous soft tissue edema possibly related to third spacing or cellulitis. IMPRESSION: IMPRESSION 1. No joint effusion, subchondral marrow change nor synovial hyper enhancement to suggest septic arthritis about the ankle joint. 2. Generalized nonspecific subcutaneous soft tissue edema possibly related to third spacing of fluid, venous insufficiency or cellulitis. 3. Intact tendons and ligaments crossing the ankle joint. Electronically Signed   By: DAshley RoyaltyM.D.   On: 08/01/2017 19:55    Assessment & Plan:   WArionwas seen today for anemia.  Diagnoses and all orders for this visit:  Other microscopic hematuria- He has a history of kidney stones.  I previously ordered a CT scan but it was not completed.  He is asymptomatic with respect to this.  I have asked him to follow-up with his urologist regarding this. -     Urinalysis, Routine w reflex microscopic; Future -     Ambulatory referral  to Urology  PSA elevation- He agrees to see urology about this. -     PSA; Future -     Ambulatory referral to Urology  Other fatigue- His H&H have improved, the rest of his labs are negative for secondary or metabolic causes for fatigue.  This is a sequela of his recent reactive arthritis.  I reassured him that I expect this to improve. -     CBC with Differential/Platelet; Future -     Comprehensive metabolic panel; Future -     Thyroid Panel With TSH; Future  Reactive arthritis ([place])- His H&H has improved, his sed rate is down/almost in the normal range, his symptoms have resolved, will continue to monitor for recurrence. -     Sedimentation rate;  Future  Colon cancer screening -     Ambulatory referral to Gastroenterology   I am having [name] maintain his rosuvastatin, latanoprost, SUPREP BOWEL PREP KIT, Colchicine, PROMETHAZINE VC/CODEINE, ibuprofen, and fenofibrate.  No orders of the defined types were placed in this encounter.    Follow-up: Return in about 4 weeks (around 10/02/2017).  [name], MD

## 2017-09-05 ENCOUNTER — Encounter: Payer: Self-pay | Admitting: Internal Medicine

## 2017-09-06 LAB — THYROID PANEL WITH TSH
FREE THYROXINE INDEX: 2 (ref 1.4–3.8)
T3 Uptake: 29 % (ref 22–35)
T4, Total: 6.8 ug/dL (ref 4.9–10.5)
TSH: 1.69 m[IU]/L (ref 0.40–4.50)

## 2017-09-09 DIAGNOSIS — Z1211 Encounter for screening for malignant neoplasm of colon: Secondary | ICD-10-CM | POA: Insufficient documentation

## 2017-09-09 DIAGNOSIS — H401133 Primary open-angle glaucoma, bilateral, severe stage: Secondary | ICD-10-CM | POA: Diagnosis not present

## 2017-09-11 DIAGNOSIS — F330 Major depressive disorder, recurrent, mild: Secondary | ICD-10-CM | POA: Diagnosis not present

## 2017-09-13 DIAGNOSIS — M546 Pain in thoracic spine: Secondary | ICD-10-CM | POA: Diagnosis not present

## 2017-09-13 DIAGNOSIS — M9902 Segmental and somatic dysfunction of thoracic region: Secondary | ICD-10-CM | POA: Diagnosis not present

## 2017-09-13 DIAGNOSIS — M545 Low back pain: Secondary | ICD-10-CM | POA: Diagnosis not present

## 2017-09-13 DIAGNOSIS — M9903 Segmental and somatic dysfunction of lumbar region: Secondary | ICD-10-CM | POA: Diagnosis not present

## 2017-09-18 DIAGNOSIS — R3121 Asymptomatic microscopic hematuria: Secondary | ICD-10-CM | POA: Diagnosis not present

## 2017-09-18 DIAGNOSIS — R972 Elevated prostate specific antigen [PSA]: Secondary | ICD-10-CM | POA: Diagnosis not present

## 2017-09-25 DIAGNOSIS — F330 Major depressive disorder, recurrent, mild: Secondary | ICD-10-CM | POA: Diagnosis not present

## 2017-10-09 DIAGNOSIS — F330 Major depressive disorder, recurrent, mild: Secondary | ICD-10-CM | POA: Diagnosis not present

## 2017-10-10 DIAGNOSIS — M546 Pain in thoracic spine: Secondary | ICD-10-CM | POA: Diagnosis not present

## 2017-10-10 DIAGNOSIS — M9901 Segmental and somatic dysfunction of cervical region: Secondary | ICD-10-CM | POA: Diagnosis not present

## 2017-10-10 DIAGNOSIS — M542 Cervicalgia: Secondary | ICD-10-CM | POA: Diagnosis not present

## 2017-10-10 DIAGNOSIS — M9902 Segmental and somatic dysfunction of thoracic region: Secondary | ICD-10-CM | POA: Diagnosis not present

## 2017-10-21 DIAGNOSIS — F330 Major depressive disorder, recurrent, mild: Secondary | ICD-10-CM | POA: Diagnosis not present

## 2017-10-22 ENCOUNTER — Encounter: Payer: Self-pay | Admitting: Internal Medicine

## 2017-10-29 DIAGNOSIS — M546 Pain in thoracic spine: Secondary | ICD-10-CM | POA: Diagnosis not present

## 2017-10-29 DIAGNOSIS — M542 Cervicalgia: Secondary | ICD-10-CM | POA: Diagnosis not present

## 2017-10-29 DIAGNOSIS — M9902 Segmental and somatic dysfunction of thoracic region: Secondary | ICD-10-CM | POA: Diagnosis not present

## 2017-10-29 DIAGNOSIS — M9901 Segmental and somatic dysfunction of cervical region: Secondary | ICD-10-CM | POA: Diagnosis not present

## 2017-11-04 DIAGNOSIS — M542 Cervicalgia: Secondary | ICD-10-CM | POA: Diagnosis not present

## 2017-11-04 DIAGNOSIS — M546 Pain in thoracic spine: Secondary | ICD-10-CM | POA: Diagnosis not present

## 2017-11-04 DIAGNOSIS — M9901 Segmental and somatic dysfunction of cervical region: Secondary | ICD-10-CM | POA: Diagnosis not present

## 2017-11-04 DIAGNOSIS — M9902 Segmental and somatic dysfunction of thoracic region: Secondary | ICD-10-CM | POA: Diagnosis not present

## 2017-11-11 DIAGNOSIS — M9901 Segmental and somatic dysfunction of cervical region: Secondary | ICD-10-CM | POA: Diagnosis not present

## 2017-11-11 DIAGNOSIS — M542 Cervicalgia: Secondary | ICD-10-CM | POA: Diagnosis not present

## 2017-11-11 DIAGNOSIS — M546 Pain in thoracic spine: Secondary | ICD-10-CM | POA: Diagnosis not present

## 2017-11-11 DIAGNOSIS — M9902 Segmental and somatic dysfunction of thoracic region: Secondary | ICD-10-CM | POA: Diagnosis not present

## 2017-11-19 DIAGNOSIS — M542 Cervicalgia: Secondary | ICD-10-CM | POA: Diagnosis not present

## 2017-11-19 DIAGNOSIS — M9901 Segmental and somatic dysfunction of cervical region: Secondary | ICD-10-CM | POA: Diagnosis not present

## 2017-11-19 DIAGNOSIS — M546 Pain in thoracic spine: Secondary | ICD-10-CM | POA: Diagnosis not present

## 2017-11-19 DIAGNOSIS — M9902 Segmental and somatic dysfunction of thoracic region: Secondary | ICD-10-CM | POA: Diagnosis not present

## 2017-11-27 DIAGNOSIS — F330 Major depressive disorder, recurrent, mild: Secondary | ICD-10-CM | POA: Diagnosis not present

## 2017-12-02 DIAGNOSIS — F330 Major depressive disorder, recurrent, mild: Secondary | ICD-10-CM | POA: Diagnosis not present

## 2017-12-06 DIAGNOSIS — R972 Elevated prostate specific antigen [PSA]: Secondary | ICD-10-CM | POA: Diagnosis not present

## 2017-12-10 DIAGNOSIS — M62838 Other muscle spasm: Secondary | ICD-10-CM | POA: Diagnosis not present

## 2017-12-10 DIAGNOSIS — M9902 Segmental and somatic dysfunction of thoracic region: Secondary | ICD-10-CM | POA: Diagnosis not present

## 2017-12-10 DIAGNOSIS — M9901 Segmental and somatic dysfunction of cervical region: Secondary | ICD-10-CM | POA: Diagnosis not present

## 2017-12-10 DIAGNOSIS — M542 Cervicalgia: Secondary | ICD-10-CM | POA: Diagnosis not present

## 2017-12-25 DIAGNOSIS — F330 Major depressive disorder, recurrent, mild: Secondary | ICD-10-CM | POA: Diagnosis not present

## 2018-01-07 DIAGNOSIS — F330 Major depressive disorder, recurrent, mild: Secondary | ICD-10-CM | POA: Diagnosis not present

## 2018-01-08 DIAGNOSIS — H401133 Primary open-angle glaucoma, bilateral, severe stage: Secondary | ICD-10-CM | POA: Diagnosis not present

## 2018-01-09 DIAGNOSIS — M62838 Other muscle spasm: Secondary | ICD-10-CM | POA: Diagnosis not present

## 2018-01-09 DIAGNOSIS — M9901 Segmental and somatic dysfunction of cervical region: Secondary | ICD-10-CM | POA: Diagnosis not present

## 2018-01-09 DIAGNOSIS — M9902 Segmental and somatic dysfunction of thoracic region: Secondary | ICD-10-CM | POA: Diagnosis not present

## 2018-01-09 DIAGNOSIS — M542 Cervicalgia: Secondary | ICD-10-CM | POA: Diagnosis not present

## 2018-01-13 DIAGNOSIS — M542 Cervicalgia: Secondary | ICD-10-CM | POA: Diagnosis not present

## 2018-01-13 DIAGNOSIS — M9901 Segmental and somatic dysfunction of cervical region: Secondary | ICD-10-CM | POA: Diagnosis not present

## 2018-01-13 DIAGNOSIS — M62838 Other muscle spasm: Secondary | ICD-10-CM | POA: Diagnosis not present

## 2018-01-13 DIAGNOSIS — M9902 Segmental and somatic dysfunction of thoracic region: Secondary | ICD-10-CM | POA: Diagnosis not present

## 2018-01-16 DIAGNOSIS — F330 Major depressive disorder, recurrent, mild: Secondary | ICD-10-CM | POA: Diagnosis not present

## 2018-01-24 ENCOUNTER — Other Ambulatory Visit: Payer: Self-pay | Admitting: Internal Medicine

## 2018-01-24 DIAGNOSIS — E785 Hyperlipidemia, unspecified: Secondary | ICD-10-CM

## 2018-02-05 DIAGNOSIS — M9902 Segmental and somatic dysfunction of thoracic region: Secondary | ICD-10-CM | POA: Diagnosis not present

## 2018-02-05 DIAGNOSIS — M62838 Other muscle spasm: Secondary | ICD-10-CM | POA: Diagnosis not present

## 2018-02-05 DIAGNOSIS — M542 Cervicalgia: Secondary | ICD-10-CM | POA: Diagnosis not present

## 2018-02-05 DIAGNOSIS — M9901 Segmental and somatic dysfunction of cervical region: Secondary | ICD-10-CM | POA: Diagnosis not present

## 2018-02-19 ENCOUNTER — Other Ambulatory Visit: Payer: Self-pay | Admitting: Internal Medicine

## 2018-02-19 DIAGNOSIS — M9901 Segmental and somatic dysfunction of cervical region: Secondary | ICD-10-CM | POA: Diagnosis not present

## 2018-02-19 DIAGNOSIS — M542 Cervicalgia: Secondary | ICD-10-CM | POA: Diagnosis not present

## 2018-02-19 DIAGNOSIS — M62838 Other muscle spasm: Secondary | ICD-10-CM | POA: Diagnosis not present

## 2018-02-19 DIAGNOSIS — F330 Major depressive disorder, recurrent, mild: Secondary | ICD-10-CM | POA: Diagnosis not present

## 2018-02-19 DIAGNOSIS — E781 Pure hyperglyceridemia: Secondary | ICD-10-CM

## 2018-02-19 DIAGNOSIS — M9902 Segmental and somatic dysfunction of thoracic region: Secondary | ICD-10-CM | POA: Diagnosis not present

## 2018-02-26 DIAGNOSIS — F330 Major depressive disorder, recurrent, mild: Secondary | ICD-10-CM | POA: Diagnosis not present

## 2018-03-03 DIAGNOSIS — M62838 Other muscle spasm: Secondary | ICD-10-CM | POA: Diagnosis not present

## 2018-03-03 DIAGNOSIS — M9902 Segmental and somatic dysfunction of thoracic region: Secondary | ICD-10-CM | POA: Diagnosis not present

## 2018-03-03 DIAGNOSIS — M542 Cervicalgia: Secondary | ICD-10-CM | POA: Diagnosis not present

## 2018-03-03 DIAGNOSIS — M9901 Segmental and somatic dysfunction of cervical region: Secondary | ICD-10-CM | POA: Diagnosis not present

## 2018-03-17 DIAGNOSIS — H401133 Primary open-angle glaucoma, bilateral, severe stage: Secondary | ICD-10-CM | POA: Diagnosis not present

## 2018-03-18 DIAGNOSIS — G44209 Tension-type headache, unspecified, not intractable: Secondary | ICD-10-CM | POA: Diagnosis not present

## 2018-03-18 DIAGNOSIS — M546 Pain in thoracic spine: Secondary | ICD-10-CM | POA: Diagnosis not present

## 2018-03-18 DIAGNOSIS — M542 Cervicalgia: Secondary | ICD-10-CM | POA: Diagnosis not present

## 2018-03-18 DIAGNOSIS — M9901 Segmental and somatic dysfunction of cervical region: Secondary | ICD-10-CM | POA: Diagnosis not present

## 2018-03-19 DIAGNOSIS — F330 Major depressive disorder, recurrent, mild: Secondary | ICD-10-CM | POA: Diagnosis not present

## 2018-03-27 DIAGNOSIS — R972 Elevated prostate specific antigen [PSA]: Secondary | ICD-10-CM | POA: Diagnosis not present

## 2018-03-27 LAB — PSA: PSA: 3.89

## 2018-04-01 DIAGNOSIS — R972 Elevated prostate specific antigen [PSA]: Secondary | ICD-10-CM | POA: Diagnosis not present

## 2018-04-02 DIAGNOSIS — F330 Major depressive disorder, recurrent, mild: Secondary | ICD-10-CM | POA: Diagnosis not present

## 2018-04-08 ENCOUNTER — Encounter: Payer: Self-pay | Admitting: Gastroenterology

## 2018-04-16 DIAGNOSIS — F330 Major depressive disorder, recurrent, mild: Secondary | ICD-10-CM | POA: Diagnosis not present

## 2018-04-17 DIAGNOSIS — M9901 Segmental and somatic dysfunction of cervical region: Secondary | ICD-10-CM | POA: Diagnosis not present

## 2018-04-17 DIAGNOSIS — G44209 Tension-type headache, unspecified, not intractable: Secondary | ICD-10-CM | POA: Diagnosis not present

## 2018-04-17 DIAGNOSIS — M542 Cervicalgia: Secondary | ICD-10-CM | POA: Diagnosis not present

## 2018-04-17 DIAGNOSIS — M546 Pain in thoracic spine: Secondary | ICD-10-CM | POA: Diagnosis not present

## 2018-04-28 ENCOUNTER — Other Ambulatory Visit: Payer: Self-pay | Admitting: Internal Medicine

## 2018-04-28 DIAGNOSIS — E785 Hyperlipidemia, unspecified: Secondary | ICD-10-CM

## 2018-04-30 DIAGNOSIS — M542 Cervicalgia: Secondary | ICD-10-CM | POA: Diagnosis not present

## 2018-04-30 DIAGNOSIS — M546 Pain in thoracic spine: Secondary | ICD-10-CM | POA: Diagnosis not present

## 2018-04-30 DIAGNOSIS — G44209 Tension-type headache, unspecified, not intractable: Secondary | ICD-10-CM | POA: Diagnosis not present

## 2018-04-30 DIAGNOSIS — M9901 Segmental and somatic dysfunction of cervical region: Secondary | ICD-10-CM | POA: Diagnosis not present

## 2018-05-01 DIAGNOSIS — M9901 Segmental and somatic dysfunction of cervical region: Secondary | ICD-10-CM | POA: Diagnosis not present

## 2018-05-01 DIAGNOSIS — M542 Cervicalgia: Secondary | ICD-10-CM | POA: Diagnosis not present

## 2018-05-01 DIAGNOSIS — G44209 Tension-type headache, unspecified, not intractable: Secondary | ICD-10-CM | POA: Diagnosis not present

## 2018-05-01 DIAGNOSIS — F330 Major depressive disorder, recurrent, mild: Secondary | ICD-10-CM | POA: Diagnosis not present

## 2018-05-01 DIAGNOSIS — M546 Pain in thoracic spine: Secondary | ICD-10-CM | POA: Diagnosis not present

## 2018-05-18 ENCOUNTER — Other Ambulatory Visit: Payer: Self-pay | Admitting: Internal Medicine

## 2018-05-18 DIAGNOSIS — E781 Pure hyperglyceridemia: Secondary | ICD-10-CM

## 2018-05-21 DIAGNOSIS — M9901 Segmental and somatic dysfunction of cervical region: Secondary | ICD-10-CM | POA: Diagnosis not present

## 2018-05-21 DIAGNOSIS — M545 Low back pain: Secondary | ICD-10-CM | POA: Diagnosis not present

## 2018-05-21 DIAGNOSIS — M542 Cervicalgia: Secondary | ICD-10-CM | POA: Diagnosis not present

## 2018-05-21 DIAGNOSIS — M546 Pain in thoracic spine: Secondary | ICD-10-CM | POA: Diagnosis not present

## 2018-05-24 ENCOUNTER — Ambulatory Visit (INDEPENDENT_AMBULATORY_CARE_PROVIDER_SITE_OTHER)
Admission: EM | Admit: 2018-05-24 | Discharge: 2018-05-24 | Disposition: A | Discharge status: Home / Self Care | Payer: BLUE CROSS/BLUE SHIELD | Source: Home / Self Care

## 2018-05-24 ENCOUNTER — Encounter (HOSPITAL_COMMUNITY): Payer: Self-pay | Admitting: *Deleted

## 2018-05-24 ENCOUNTER — Emergency Department (HOSPITAL_COMMUNITY)
Admission: EM | Admit: 2018-05-24 | Discharge: 2018-05-24 | Disposition: A | Discharge status: Home / Self Care | Payer: BLUE CROSS/BLUE SHIELD | Attending: Emergency Medicine | Admitting: Emergency Medicine

## 2018-05-24 ENCOUNTER — Emergency Department (HOSPITAL_COMMUNITY): Payer: BLUE CROSS/BLUE SHIELD

## 2018-05-24 ENCOUNTER — Other Ambulatory Visit: Payer: Self-pay

## 2018-05-24 DIAGNOSIS — Z79899 Other long term (current) drug therapy: Secondary | ICD-10-CM | POA: Insufficient documentation

## 2018-05-24 DIAGNOSIS — R531 Weakness: Secondary | ICD-10-CM | POA: Diagnosis not present

## 2018-05-24 DIAGNOSIS — R0789 Other chest pain: Secondary | ICD-10-CM

## 2018-05-24 DIAGNOSIS — R42 Dizziness and giddiness: Secondary | ICD-10-CM

## 2018-05-24 DIAGNOSIS — R079 Chest pain, unspecified: Secondary | ICD-10-CM | POA: Diagnosis not present

## 2018-05-24 LAB — BASIC METABOLIC PANEL
Anion gap: 6 (ref 5–15)
BUN: 18 mg/dL (ref 6–20)
CO2: 25 mmol/L (ref 22–32)
Calcium: 9 mg/dL (ref 8.9–10.3)
Chloride: 108 mmol/L (ref 98–111)
Creatinine, Ser: 1.21 mg/dL (ref 0.61–1.24)
GFR calc Af Amer: >60 mL/min (ref >60)
GFR calc non Af Amer: >60 mL/min (ref >60)
Glucose, Bld: 94 mg/dL (ref 70–99)
Potassium: 3.5 mmol/L (ref 3.5–5.1)
Sodium: 139 mmol/L (ref 135–145)

## 2018-05-24 LAB — CBC
HCT: 45.2 % (ref 39.0–52.0)
Hemoglobin: 14.9 g/dL (ref 13.0–17.0)
MCH: 31 pg (ref 26.0–34.0)
MCHC: 33 g/dL (ref 30.0–36.0)
MCV: 94 fL (ref 78.0–100.0)
Platelets: 261 10*3/uL (ref 150–400)
RBC: 4.81 MIL/uL (ref 4.22–5.81)
RDW: 11.9 % (ref 11.5–15.5)
WBC: 4.8 10*3/uL (ref 4.0–10.5)

## 2018-05-24 LAB — TROPONIN I: Troponin I: <0.03 ng/mL (ref <0.03)

## 2018-05-24 LAB — I-STAT TROPONIN, ED: Troponin i, poc: 0 ng/mL (ref 0.00–0.08)

## 2018-05-24 NOTE — ED Notes (Signed)
EKG obtained and given to [place], pa. Pt walked to ER with this nurse.

## 2018-05-24 NOTE — ED Provider Notes (Signed)
[facility] EMERGENCY DEPARTMENT Provider Note   CSN: [id number] Arrival date & time: 05/24/18  1620   History   Chief Complaint Chief Complaint  Patient presents with  . Chest Pain    HPI Andrew Wright is a 57 y.o. male past medical history significant for hyperlipidemia, anxiety who presents for evaluation of chest pain.  Patient states he has had intermittent sharp, chest pressure over the last week. Approximately 3 episodes. Each episodes last 1-2 seconds. Pain is located over the left chest wall.  Patient states his last episode was last evening at approximately 8pm. States when he woke up this morning he had an episode of nausea with an episode of emesis. States he felt weak after his episode of emesis, which resolved with a nap. States he made an appointment over at urgent care this afternoon where they obtain an EKG.  The provider was concerned with the patient's EKG so he arrived to the emergency department.  EKG at [facility] with [facility]. No ST elevation. He has no chest pain currently.  HPI  Past Medical History:  Diagnosis Date  . Anxiety   . Depression   . Glaucoma   . Hyperlipidemia   . Hyperplastic colon polyp   . Hypogonadism male 2010  . Kidney stones   . Migraine     Patient Active Problem List   Diagnosis Date Noted  . Colon cancer screening 09/09/2017  . Other fatigue 09/04/2017  . Reactive arthritis ([place]) 08/01/2017  . Other microscopic hematuria 08/01/2017  . PSA elevation 01/30/2017  . Mild episode of recurrent major depressive disorder ([place]) 01/30/2017  . Hypertriglyceridemia 11/26/2014  . Dyslipidemia, goal LDL below 130 12/24/2011  . Routine general medical examination at a health care facility 11/05/2011  . Hypogonadism male 11/05/2011    Past Surgical History:  Procedure Laterality Date  . PENECTOMY     for blockage in penis  . WISDOM TOOTH EXTRACTION          Home Medications    Prior to Admission medications   Medication  Sig Start Date End Date Taking? Authorizing Provider  Colchicine 0.6 MG CAPS Take 1 capsule by mouth daily. 07/27/17   [provider]  fenofibrate 160 MG tablet TAKE 1 TABLET BY MOUTH ONCE DAILY 05/18/18   Janith Lima, MD  ibuprofen (ADVIL,MOTRIN) 600 MG tablet Take 1 tablet (600 mg total) by mouth every 6 (six) hours as needed. 08/08/17   Janith Lima, MD  latanoprost (XALATAN) 0.005 % ophthalmic solution instill 1 drop INTO AFFECTED EYE ONCE EVERY EVENING 01/10/17   [provider]  Promethazine-Phenyleph-Codeine (PROMETHAZINE VC/CODEINE) 6.25-5-10 MG/5ML SYRP Take 5 mLs by mouth 4 (four) times daily as needed. 08/08/17   Janith Lima, MD  rosuvastatin (CRESTOR) 5 MG tablet TAKE 1 TABLET BY MOUTH AT BEDTIME 04/29/18   Janith Lima, MD  SUPREP BOWEL PREP KIT 17.5-3.13-1.6 GM/180ML SOLN Take 1 kit by mouth as directed. 05/28/17   Armbruster, [name], MD    Family History Family History  Problem Relation Age of Onset  . Arthritis Other   . Stroke Other   . Breast cancer Mother   . Prostate cancer Father   . Skin cancer Sister   . Leukemia Brother   . Skin cancer Brother     Social History Social History   Tobacco Use  . Smoking status: Never Smoker  . Smokeless tobacco: Never Used  Substance Use Topics  . Alcohol use: No  .  Drug use: No     Allergies   Lovaza [omega-3-acid ethyl esters]; Asa [aspirin]; Hydrocodone; and Penicillins   Review of Systems Review of Systems  Constitutional: Negative for activity change, appetite change, chills, diaphoresis, fatigue, fever and unexpected weight change.  Respiratory: Negative for cough, choking, chest tightness, shortness of breath and wheezing.   Cardiovascular: Positive for chest pain. Negative for palpitations and leg swelling.  Gastrointestinal: Negative.   Musculoskeletal: Negative.   Skin: Negative.   Neurological: Negative for syncope, facial asymmetry, speech difficulty, weakness,  light-headedness, numbness and headaches.     Physical Exam Updated Vital Signs BP 123/74   Pulse (!) 58   Temp 98.3 F (36.8 C) (Oral)   Resp 11   Ht _0  (1.93 m)   Wt 86.2 kg   SpO2 99%   BMI 23.13 kg/m   Physical Exam  Constitutional: He appears well-developed and well-nourished.  Non-toxic appearance. He does not appear ill. No distress.  HENT:  Head: Atraumatic.  Eyes: Pupils are equal, round, and reactive to light.  No horizontal, vertical or rotational nystagmus   Neck: Normal range of motion. Neck supple.  Full active and passive ROM without pain No midline or paraspinal tenderness No nuchal rigidity or meningeal signs   Cardiovascular: Normal rate and regular rhythm.  Pulmonary/Chest: Effort normal and breath sounds normal. No accessory muscle usage or stridor. No tachypnea. No respiratory distress. He has no decreased breath sounds. He has no wheezes. He has no rhonchi. He has no rales.  Abdominal: Soft. Bowel sounds are normal. He exhibits no distension, no ascites and no mass. There is no splenomegaly or hepatomegaly. There is no tenderness. There is no rebound and no guarding.  Musculoskeletal: Normal range of motion.  Neurological: He is alert.  Mental Status:  Alert, oriented, thought content appropriate. Speech fluent without evidence of aphasia. Able to follow 2 step commands without difficulty.  Cranial Nerves:  II:  Peripheral visual fields grossly normal, pupils equal, round, reactive to light III,IV, VI: ptosis not present, extra-ocular motions intact bilaterally  V,VII: smile symmetric, facial light touch sensation equal VIII: hearing grossly normal bilaterally  IX,X: midline uvula rise  XI: bilateral shoulder shrug equal and strong XII: midline tongue extension  Motor:  5/5 in upper and lower extremities bilaterally including strong and equal grip strength and dorsiflexion/plantar flexion Sensory: Pinprick and light touch normal in all extremities.   Deep Tendon Reflexes: 2+ and symmetric  Cerebellar: normal finger-to-nose with bilateral upper extremities Gait: normal gait and balance CV: distal pulses palpable throughout    Skin: Skin is warm and dry. He is not diaphoretic.  Psychiatric: He has a normal mood and affect.  Nursing note and vitals reviewed.     ED Treatments / Results  Labs (all labs ordered are listed, but only abnormal results are displayed) Labs Reviewed  BASIC METABOLIC PANEL  CBC  TROPONIN I  I-STAT TROPONIN, ED    EKG EKG Interpretation  Date/Time:  Saturday May 24 2018 16:22:26 EDT Ventricular Rate:  71 PR Interval:  154 QRS Duration: 86 QT Interval:  412 QTC Calculation: 447 R Axis:   58 Text Interpretation:  Normal sinus rhythm Minimal voltage criteria for LVH, may be normal variant Borderline ECG Confirmed by Virgel Manifold [phone]) on 05/24/2018 5:36:32 PM   Radiology Dg Chest 2 View  Result Date: 05/24/2018 CLINICAL DATA:  Chest pain. EXAM: CHEST - 2 VIEW COMPARISON:  Radiographs of August 01, 2017. FINDINGS: The heart size and  mediastinal contours are within normal limits. Both lungs are clear. No pneumothorax or pleural effusion is noted. The visualized skeletal structures are unremarkable. IMPRESSION: No active cardiopulmonary disease. Electronically Signed   By: Marijo Conception, M.D.   On: 05/24/2018 17:48    Procedures Procedures (including critical care time)  Medications Ordered in ED Medications - No data to display   Initial Impression / Assessment and Plan / ED Course  I have reviewed the triage vital signs and the nursing notes as well as past medical history.  Pertinent labs & imaging results that were available during my care of the patient were reviewed by me and considered in my medical decision making (see chart for details).  Presents for evaluation of intermittent chest pain, sharp in nature lasting 1-2 seconds.  Last episode 20 hours ago. Heart score low  risk. Troponin negative. Wells low risk. Episode of emesis with subsequent weakness when he first awoke. Weakness resolved with nap. Has been able to tolerate PO intake after emesis.  Patient is to be discharged with recommendation to follow up with PCP in regards to today's hospital visit. Chest pain is not likely of cardiac or pulmonary etiology d/t presentation, VSS, no tracheal deviation, no JVD or new murmur, RRR, breath sounds equal bilaterally, EKG without acute abnormalities, negative troponin, and negative CXR. Pt has been advised to return to the ED if CP becomes exertional, associated with diaphoresis or nausea, radiates to left jaw/arm, worsens or becomes concerning in any way. Pt appears reliable for follow up and is agreeable to discharge.   Case has been discussed with Dr. Wilson Singer who agrees with the above plan to discharge.     Final Clinical Impressions(s) / ED Diagnoses   Final diagnoses:  Atypical chest pain    ED Discharge Orders    None       Dameon Soltis A, PA-C 05/24/18 2015    Virgel Manifold, MD 05/26/18 1358

## 2018-05-24 NOTE — Discharge Instructions (Addendum)
You were seen evaluated today for chest pain.  Your lab work, EKG and chest xray were negative. Please follow-up with your PCP for reevaluation and possible outpatient Cardiology follow-up.  Please return to the ED with any new or worsening symptoms such as:  Get help right away if: Your chest pain is worse. You have a cough that gets worse, or you cough up blood. You have very bad (severe) pain in your belly (abdomen). You are very weak. You pass out (faint). You have either of these for no clear reason: Sudden chest discomfort. Sudden discomfort in your arms, back, neck, or jaw. You have shortness of breath at any time. You suddenly start to sweat, or your skin gets clammy. You feel sick to your stomach (nauseous). You throw up (vomit). You suddenly feel light-headed or dizzy. Your heart starts to beat fast, or it feels like it is skipping beats.

## 2018-05-24 NOTE — ED Notes (Signed)
Patient transported to X-ray 

## 2018-05-24 NOTE — ED Triage Notes (Signed)
The pt was sent down from [facility] after getting an ekg there.  The pt has had intermittent chest pressure  For one week  None now he is c/o some weakness  And nausea

## 2018-05-24 NOTE — ED Triage Notes (Signed)
Pt states he woke up today with weakness, dizziness, nausea, pt also endorses off and on chest pain.

## 2018-05-28 ENCOUNTER — Ambulatory Visit: Payer: BLUE CROSS/BLUE SHIELD | Admitting: Family

## 2018-05-28 ENCOUNTER — Encounter: Payer: Self-pay | Admitting: Family

## 2018-05-28 VITALS — BP 122/74 | HR 78 | Temp 97.8°F | Ht 76.0 in | Wt 193.0 lb

## 2018-05-28 DIAGNOSIS — R0789 Other chest pain: Secondary | ICD-10-CM

## 2018-05-28 NOTE — Progress Notes (Signed)
Andrew Wright is a 57 y.o. male with the following history as recorded in Epic[employer]:  Patient Active Problem List   Diagnosis Date Noted  . Colon cancer screening 09/09/2017  . Other fatigue 09/04/2017  . Reactive arthritis ([place]) 08/01/2017  . Other microscopic hematuria 08/01/2017  . PSA elevation 01/30/2017  . Mild episode of recurrent major depressive disorder ([place]) 01/30/2017  . Hypertriglyceridemia 11/26/2014  . Dyslipidemia, goal LDL below 130 12/24/2011  . Routine general medical examination at a health care facility 11/05/2011  . Hypogonadism male 11/05/2011    Current Outpatient Medications  Medication Sig Dispense Refill  . Colchicine 0.6 MG CAPS Take 1 capsule by mouth daily.  0  . fenofibrate 160 MG tablet TAKE 1 TABLET BY MOUTH ONCE DAILY 90 tablet 0  . ibuprofen (ADVIL,MOTRIN) 600 MG tablet Take 1 tablet (600 mg total) by mouth every 6 (six) hours as needed. 30 tablet 1  . latanoprost (XALATAN) 0.005 % ophthalmic solution instill 1 drop INTO AFFECTED EYE ONCE EVERY EVENING  0  . Promethazine-Phenyleph-Codeine (PROMETHAZINE VC/CODEINE) 6.25-5-10 MG/5ML SYRP Take 5 mLs by mouth 4 (four) times daily as needed. 240 mL 0  . rosuvastatin (CRESTOR) 5 MG tablet TAKE 1 TABLET BY MOUTH AT BEDTIME 90 tablet 1  . SUPREP BOWEL PREP KIT 17.5-3.13-1.6 GM/180ML SOLN Take 1 kit by mouth as directed. 354 mL 0   No current facility-administered medications for this visit.     Allergies: Lovaza [omega-3-acid ethyl esters]; Asa [aspirin]; Hydrocodone; and Penicillins  Past Medical History:  Diagnosis Date  . Anxiety   . Depression   . Glaucoma   . Hyperlipidemia   . Hyperplastic colon polyp   . Hypogonadism male 2010  . Kidney stones   . Migraine     Past Surgical History:  Procedure Laterality Date  . PENECTOMY     for blockage in penis  . WISDOM TOOTH EXTRACTION      Family History  Problem Relation Age of Onset  . Arthritis Other   . Stroke Other   . Breast cancer Mother    . Prostate cancer Father   . Skin cancer Sister   . Leukemia Brother   . Skin cancer Brother     Social History   Tobacco Use  . Smoking status: Never Smoker  . Smokeless tobacco: Never Used  Substance Use Topics  . Alcohol use: No    Subjective:  Patient was seen at the ER on 9/28 with 3 episodes of chest pain over the past week; woke up Saturday morning with sensation of feeling dizzy, off balance; evaluation at ER was normal but told to follow-up with PCP; has not any symptoms since last Saturday; Denies any chest pain, shortness of breath, blurred vision or headache. 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  Head: Normocephalic and atraumatic  Eyes: Sclera and conjunctiva clear; pupils round and reactive to light; extraocular movements intact  Ears: External normal; canals clear; tympanic membranes normal  Oropharynx: Pink, supple. No suspicious lesions  Neck: Supple without thyromegaly, adenopathy  Lungs: Respirations unlabored; clear to  auscultation bilaterally without wheeze, rales, rhonchi  CVS exam: normal rate and regular rhythm.  Abdomen: Soft; nontender; nondistended; normoactive bowel sounds; no masses or hepatosplenomegaly  Musculoskeletal: No deformities; no active joint inflammation  Neurologic: Alert and oriented; speech intact; face symmetrical; moves all extremities well; CNII-XII intact without focal deficit   Assessment:  1. Atypical chest pain     Plan:  Low suspicion for cardiac source; reviewed EKG, CXR and labs from recent ER visit; reassurance provided- suspect stress related; patient has been  feeling fine since last weekend; follow-up if symptoms return; encouraged rest, stress reduction.   No follow-ups on file.  No orders of the defined types were placed in this encounter.   Requested Prescriptions    No prescriptions requested or ordered in this encounter

## 2018-05-30 ENCOUNTER — Ambulatory Visit (AMBULATORY_SURGERY_CENTER): Payer: Self-pay | Admitting: *Deleted

## 2018-05-30 ENCOUNTER — Encounter: Payer: Self-pay | Admitting: Gastroenterology

## 2018-05-30 ENCOUNTER — Telehealth: Payer: Self-pay | Admitting: *Deleted

## 2018-05-30 VITALS — Ht 75.0 in | Wt 194.0 lb

## 2018-05-30 DIAGNOSIS — K625 Hemorrhage of anus and rectum: Secondary | ICD-10-CM

## 2018-05-30 NOTE — Progress Notes (Signed)
Patient denies any allergies to eggs or soy. Patient denies any problems with anesthesia/sedation. Patient denies any oxygen use at home. Patient denies taking any diet/weight loss medications or blood thinners. EMMI education assisgned to patient on colonoscopy, this was explained and instructions given to patient. Pt has Suprep kit with him, expires 7/20. Pt will take that one.

## 2018-05-30 NOTE — Telephone Encounter (Signed)
Noted. Thanks.

## 2018-05-30 NOTE — Telephone Encounter (Signed)
Patient came in today for PV for colonoscopy on 06/10/18. He was in the ED for Chest pain on 05/24/18. He states he was under a lot of stress and he has not had any more chest pain since then. His EKG was normal, labs done and he did follow up with Dr.Jones. Patient states he has not been asked to do any further cardiac tests. Please review. [place] [employer]? Thanks, Sahiba Granholm pv

## 2018-05-30 NOTE — Telephone Encounter (Signed)
Robbin,  This pt is cleared for anesthetic care at [facility].  Thanks,  Osvaldo Angst

## 2018-06-03 DIAGNOSIS — M545 Low back pain: Secondary | ICD-10-CM | POA: Diagnosis not present

## 2018-06-03 DIAGNOSIS — M546 Pain in thoracic spine: Secondary | ICD-10-CM | POA: Diagnosis not present

## 2018-06-03 DIAGNOSIS — M9901 Segmental and somatic dysfunction of cervical region: Secondary | ICD-10-CM | POA: Diagnosis not present

## 2018-06-03 DIAGNOSIS — M542 Cervicalgia: Secondary | ICD-10-CM | POA: Diagnosis not present

## 2018-06-09 ENCOUNTER — Telehealth (HOSPITAL_COMMUNITY): Payer: Self-pay

## 2018-06-10 ENCOUNTER — Encounter: Payer: Self-pay | Admitting: Gastroenterology

## 2018-06-10 ENCOUNTER — Telehealth: Payer: Self-pay

## 2018-06-10 ENCOUNTER — Ambulatory Visit (AMBULATORY_SURGERY_CENTER): Payer: BLUE CROSS/BLUE SHIELD | Admitting: Gastroenterology

## 2018-06-10 VITALS — BP 104/65 | HR 60 | Temp 98.0°F | Resp 12 | Ht 76.0 in | Wt 193.0 lb

## 2018-06-10 DIAGNOSIS — D125 Benign neoplasm of sigmoid colon: Secondary | ICD-10-CM

## 2018-06-10 DIAGNOSIS — Z1211 Encounter for screening for malignant neoplasm of colon: Secondary | ICD-10-CM | POA: Diagnosis not present

## 2018-06-10 DIAGNOSIS — D124 Benign neoplasm of descending colon: Secondary | ICD-10-CM

## 2018-06-10 DIAGNOSIS — D123 Benign neoplasm of transverse colon: Secondary | ICD-10-CM

## 2018-06-10 DIAGNOSIS — K625 Hemorrhage of anus and rectum: Secondary | ICD-10-CM | POA: Diagnosis not present

## 2018-06-10 DIAGNOSIS — D122 Benign neoplasm of ascending colon: Secondary | ICD-10-CM

## 2018-06-10 LAB — HM COLONOSCOPY

## 2018-06-10 MED ORDER — SODIUM CHLORIDE 0.9 % IV SOLN: 500.0000 mL | Freq: Once | INTRAVENOUS | Status: DC

## 2018-06-10 NOTE — Telephone Encounter (Signed)
Spoke with patient and he needed note stating he had seen [place] for a follow-up regarding his ED visit for chest pain. Needed documentation for his flight because he missed it due to chest pain and visits. Note done and left up front for pickup.

## 2018-06-10 NOTE — Progress Notes (Signed)
Called to room to assist during endoscopic procedure.  Patient ID and intended procedure confirmed with present staff. Received instructions for my participation in the procedure from the performing physician.  

## 2018-06-10 NOTE — Telephone Encounter (Signed)
Called and left message for patient to return call to clinic. Needed to verify what he needed note to say. Wasn't sure if he needed note just stating he had been seen in the office or if he needed something else specifically.  CRM created incase he calls back.

## 2018-06-10 NOTE — Progress Notes (Signed)
Report given to PACU, vss 

## 2018-06-10 NOTE — Op Note (Signed)
[name] Patient Name: Andrew Wright Procedure Date: 06/10/2018 8:32 AM MRN: [id number] Endoscopist: Remo Lipps P. Havery Moros , MD Age: 57 Referring MD:  Date of Birth: [birth year] Gender: Male Account #: [id number] Procedure:                Colonoscopy Indications:              intermittent rectal bleeding Medicines:                Monitored Anesthesia Care Procedure:                Pre-Anesthesia Assessment:                           - Prior to the procedure, a History and Physical                            was performed, and patient medications and                            allergies were reviewed. The patient's tolerance of                            previous anesthesia was also reviewed. The risks                            and benefits of the procedure and the sedation                            options and risks were discussed with the patient.                            All questions were answered, and informed consent                            was obtained. Prior Anticoagulants: The patient has                            taken no previous anticoagulant or antiplatelet                            agents. ASA Grade Assessment: II - A patient with                            mild systemic disease. After reviewing the risks                            and benefits, the patient was deemed in                            satisfactory condition to undergo the procedure.                           After obtaining informed consent, the colonoscope  was passed under direct vision. Throughout the                            procedure, the patient's blood pressure, pulse, and                            oxygen saturations were monitored continuously. The                            Colonoscope was introduced through the anus and                            advanced to the the terminal ileum, with                            identification of the appendiceal  orifice and IC                            valve. The colonoscopy was performed without                            difficulty. The patient tolerated the procedure                            well. The quality of the bowel preparation was                            good. The terminal ileum, ileocecal valve,                            appendiceal orifice, and rectum were photographed. Scope In: 8:44:12 AM Scope Out: 9:09:02 AM Scope Withdrawal Time: 0 hours 22 minutes 20 seconds  Total Procedure Duration: 0 hours 24 minutes 50 seconds  Findings:                 The perianal and digital rectal examinations were                            normal.                           The terminal ileum appeared normal.                           Five sessile polyps were found in the ascending                            colon. The polyps were 3 to 4 mm in size. These                            polyps were removed with a cold snare. Resection                            and retrieval were complete.  Two sessile polyps were found in the transverse                            colon. The polyps were 3 mm in size. These polyps                            were removed with a cold snare. Resection and                            retrieval were complete.                           A 3 mm polyp was found in the descending colon. The                            polyp was sessile. The polyp was removed with a                            cold snare. Resection and retrieval were complete.                           A 3 mm polyp was found in the sigmoid colon. The                            polyp was sessile. The polyp was removed with a                            cold snare. Resection and retrieval were complete.                           Internal hemorrhoids were found during                            retroflexion. The hemorrhoids were moderate.                           The exam was otherwise without  abnormality. Complications:            No immediate complications. Estimated blood loss:                            Minimal. Estimated Blood Loss:     Estimated blood loss was minimal. Impression:               - The examined portion of the ileum was normal.                           - Five 3 to 4 mm polyps in the ascending colon,                            removed with a cold snare. Resected and retrieved.                           -  Two 3 mm polyps in the transverse colon, removed                            with a cold snare. Resected and retrieved.                           - One 3 mm polyp in the descending colon, removed                            with a cold snare. Resected and retrieved.                           - One 3 mm polyp in the sigmoid colon, removed with                            a cold snare. Resected and retrieved.                           - Internal hemorrhoids.                           - The examination was otherwise normal.                           Overall, I suspect hemorrhoids are cause of                            intermittent bleeding Recommendation:           - Patient has a contact number available for                            emergencies. The signs and symptoms of potential                            delayed complications were discussed with the                            patient. Return to normal activities tomorrow.                            Written discharge instructions were provided to the                            patient.                           - Resume previous diet.                           - Continue present medications.                           - Await pathology results.                           -  Daily fiber supplement for hemorrhoids                           - Consideration for hemorrhoid banding if symptoms                            persist or bothersome Steven P. Armbruster, MD 06/10/2018 9:15:03 AM This report has been  signed electronically.

## 2018-06-10 NOTE — Telephone Encounter (Signed)
Pt called back and can be reached at home number.

## 2018-06-10 NOTE — Progress Notes (Signed)
Pt's states no medical or surgical changes since previsit or office visit. 

## 2018-06-10 NOTE — Patient Instructions (Signed)
YOU HAD AN ENDOSCOPIC PROCEDURE TODAY AT [facility]:   Refer to the procedure report that was given to you for any specific questions about what was found during the examination.  If the procedure report does not answer your questions, please call your gastroenterologist to clarify.  If you requested that your care partner not be given the details of your procedure findings, then the procedure report has been included in a sealed envelope for you to review at your convenience later.  YOU SHOULD EXPECT: Some feelings of bloating in the abdomen. Passage of more gas than usual.  Walking can help get rid of the air that was put into your GI tract during the procedure and reduce the bloating. If you had a lower endoscopy (such as a colonoscopy or flexible sigmoidoscopy) you may notice spotting of blood in your stool or on the toilet paper. If you underwent a bowel prep for your procedure, you may not have a normal bowel movement for a few days.  Please Note:  You might notice some irritation and congestion in your nose or some drainage.  This is from the oxygen used during your procedure.  There is no need for concern and it should clear up in a day or so.  SYMPTOMS TO REPORT IMMEDIATELY:   Following lower endoscopy (colonoscopy or flexible sigmoidoscopy):  Excessive amounts of blood in the stool  Significant tenderness or worsening of abdominal pains  Swelling of the abdomen that is new, acute  Fever of 100F or higher   For urgent or emergent issues, a gastroenterologist can be reached at any hour by calling [phone].   DIET:  We do recommend a small meal at first, but then you may proceed to your regular diet.  Drink plenty of fluids but you should avoid alcoholic beverages for 24 hours.  MEDICATIONS: Continue present medications. Use a daily fiber supplement for hemorrhoids.  Follow up with Dr. Havery Moros for consideration of hemorrhoid banding if symptoms  persist.  Please see handouts given to you by your recovery nurse.  ACTIVITY:  You should plan to take it easy for the rest of today and you should NOT DRIVE or use heavy machinery until tomorrow (because of the sedation medicines used during the test).    FOLLOW UP: Our staff will call the number listed on your records the next business day following your procedure to check on you and address any questions or concerns that you may have regarding the information given to you following your procedure. If we do not reach you, we will leave a message.  However, if you are feeling well and you are not experiencing any problems, there is no need to return our call.  We will assume that you have returned to your regular daily activities without incident.  If any biopsies were taken you will be contacted by phone or by letter within the next 1-3 weeks.  Please call us at [phone] if you have not heard about the biopsies in 3 weeks.   Thank you for allowing [place] to provide for your healthcare needs today.   SIGNATURES/CONFIDENTIALITY: You and/or your care partner have signed paperwork which will be entered into your electronic medical record.  These signatures attest to the fact that that the information above on your After Visit Summary has been reviewed and is understood.  Full responsibility of the confidentiality of this discharge information lies with you and/or your care-partner.

## 2018-06-11 ENCOUNTER — Telehealth: Payer: Self-pay | Admitting: *Deleted

## 2018-06-11 NOTE — Telephone Encounter (Signed)
  Follow up Call-  Call back number 06/10/2018  Post procedure Call Back phone  # [phone]  Permission to leave phone message Yes  Some recent data might be hidden     Patient questions:  Do you have a fever, pain , or abdominal swelling? No. Pain Score  0 *  Have you tolerated food without any problems? No.  Have you been able to return to your normal activities? Yes.    Do you have any questions about your discharge instructions: Diet   No. Medications  No. Follow up visit  No.  Do you have questions or concerns about your Care? No.  Actions: * If pain score is 4 or above: No action needed, pain <4.

## 2018-06-19 LAB — HM COLONOSCOPY

## 2018-07-14 ENCOUNTER — Encounter: Payer: Self-pay | Admitting: Internal Medicine

## 2018-07-16 DIAGNOSIS — M546 Pain in thoracic spine: Secondary | ICD-10-CM | POA: Diagnosis not present

## 2018-07-16 DIAGNOSIS — M545 Low back pain: Secondary | ICD-10-CM | POA: Diagnosis not present

## 2018-07-16 DIAGNOSIS — M542 Cervicalgia: Secondary | ICD-10-CM | POA: Diagnosis not present

## 2018-07-16 DIAGNOSIS — M9901 Segmental and somatic dysfunction of cervical region: Secondary | ICD-10-CM | POA: Diagnosis not present

## 2018-07-16 DIAGNOSIS — H401133 Primary open-angle glaucoma, bilateral, severe stage: Secondary | ICD-10-CM | POA: Diagnosis not present

## 2018-07-28 DIAGNOSIS — M542 Cervicalgia: Secondary | ICD-10-CM | POA: Diagnosis not present

## 2018-07-28 DIAGNOSIS — M9901 Segmental and somatic dysfunction of cervical region: Secondary | ICD-10-CM | POA: Diagnosis not present

## 2018-07-28 DIAGNOSIS — M546 Pain in thoracic spine: Secondary | ICD-10-CM | POA: Diagnosis not present

## 2018-07-28 DIAGNOSIS — M545 Low back pain: Secondary | ICD-10-CM | POA: Diagnosis not present

## 2018-08-06 DIAGNOSIS — M9901 Segmental and somatic dysfunction of cervical region: Secondary | ICD-10-CM | POA: Diagnosis not present

## 2018-08-06 DIAGNOSIS — M542 Cervicalgia: Secondary | ICD-10-CM | POA: Diagnosis not present

## 2018-08-06 DIAGNOSIS — M546 Pain in thoracic spine: Secondary | ICD-10-CM | POA: Diagnosis not present

## 2018-08-06 DIAGNOSIS — M545 Low back pain: Secondary | ICD-10-CM | POA: Diagnosis not present

## 2018-08-13 DIAGNOSIS — M545 Low back pain: Secondary | ICD-10-CM | POA: Diagnosis not present

## 2018-08-13 DIAGNOSIS — M9903 Segmental and somatic dysfunction of lumbar region: Secondary | ICD-10-CM | POA: Diagnosis not present

## 2018-08-13 DIAGNOSIS — M9905 Segmental and somatic dysfunction of pelvic region: Secondary | ICD-10-CM | POA: Diagnosis not present

## 2018-08-13 DIAGNOSIS — M9904 Segmental and somatic dysfunction of sacral region: Secondary | ICD-10-CM | POA: Diagnosis not present

## 2018-08-15 ENCOUNTER — Other Ambulatory Visit: Payer: Self-pay | Admitting: Internal Medicine

## 2018-08-15 DIAGNOSIS — E781 Pure hyperglyceridemia: Secondary | ICD-10-CM

## 2018-09-04 DIAGNOSIS — M9904 Segmental and somatic dysfunction of sacral region: Secondary | ICD-10-CM | POA: Diagnosis not present

## 2018-09-04 DIAGNOSIS — M545 Low back pain: Secondary | ICD-10-CM | POA: Diagnosis not present

## 2018-09-04 DIAGNOSIS — M9903 Segmental and somatic dysfunction of lumbar region: Secondary | ICD-10-CM | POA: Diagnosis not present

## 2018-09-04 DIAGNOSIS — M9905 Segmental and somatic dysfunction of pelvic region: Secondary | ICD-10-CM | POA: Diagnosis not present

## 2018-09-09 ENCOUNTER — Encounter: Payer: Self-pay | Admitting: Family

## 2018-09-09 ENCOUNTER — Other Ambulatory Visit (INDEPENDENT_AMBULATORY_CARE_PROVIDER_SITE_OTHER): Payer: BLUE CROSS/BLUE SHIELD

## 2018-09-09 ENCOUNTER — Ambulatory Visit (INDEPENDENT_AMBULATORY_CARE_PROVIDER_SITE_OTHER): Payer: BLUE CROSS/BLUE SHIELD | Admitting: Family

## 2018-09-09 VITALS — BP 116/80 | HR 87 | Temp 98.3°F | Ht 76.0 in | Wt 196.0 lb

## 2018-09-09 DIAGNOSIS — M791 Myalgia, unspecified site: Secondary | ICD-10-CM

## 2018-09-09 LAB — CBC WITH DIFFERENTIAL/PLATELET
Basophils Absolute: 0 10*3/uL (ref 0.0–0.1)
Basophils Relative: 0.4 % (ref 0.0–3.0)
Eosinophils Absolute: 0.1 10*3/uL (ref 0.0–0.7)
Eosinophils Relative: 2 % (ref 0.0–5.0)
HCT: 41.9 % (ref 39.0–52.0)
Hemoglobin: 14.4 g/dL (ref 13.0–17.0)
LYMPHS ABS: 1.3 10*3/uL (ref 0.7–4.0)
Lymphocytes Relative: 20.6 % (ref 12.0–46.0)
MCHC: 34.4 g/dL (ref 30.0–36.0)
MCV: 90.5 fl (ref 78.0–100.0)
MONOS PCT: 9.8 % (ref 3.0–12.0)
Monocytes Absolute: 0.6 10*3/uL (ref 0.1–1.0)
NEUTROS ABS: 4.4 10*3/uL (ref 1.4–7.7)
NEUTROS PCT: 67.2 % (ref 43.0–77.0)
PLATELETS: 259 10*3/uL (ref 150.0–400.0)
RBC: 4.63 Mil/uL (ref 4.22–5.81)
RDW: 12.7 % (ref 11.5–15.5)
WBC: 6.5 10*3/uL (ref 4.0–10.5)

## 2018-09-09 LAB — COMPREHENSIVE METABOLIC PANEL
ALBUMIN: 4.6 g/dL (ref 3.5–5.2)
ALT: 39 U/L (ref 0–53)
AST: 26 U/L (ref 0–37)
Alkaline Phosphatase: 48 U/L (ref 39–117)
BUN: 13 mg/dL (ref 6–23)
CO2: 29 meq/L (ref 19–32)
CREATININE: 1.03 mg/dL (ref 0.40–1.50)
Calcium: 9.6 mg/dL (ref 8.4–10.5)
Chloride: 105 mEq/L (ref 96–112)
GFR: 78.62 mL/min (ref >60.00)
GLUCOSE: 90 mg/dL (ref 70–99)
Potassium: 4.1 mEq/L (ref 3.5–5.1)
Sodium: 141 mEq/L (ref 135–145)
TOTAL PROTEIN: 7.1 g/dL (ref 6.0–8.3)
Total Bilirubin: 0.5 mg/dL (ref 0.2–1.2)

## 2018-09-09 LAB — POC INFLUENZA A&B (BINAX/QUICKVUE)
INFLUENZA A, POC: NEGATIVE
Influenza B, POC: NEGATIVE

## 2018-09-09 LAB — SEDIMENTATION RATE: Sed Rate: 5 mm/hr (ref 0–20)

## 2018-09-09 NOTE — Progress Notes (Signed)
Andrew Wright is a 58 y.o. male with the following history as recorded in EpicCare:  Patient Active Problem List   Diagnosis Date Noted  . Colon cancer screening 09/09/2017  . Other fatigue 09/04/2017  . Reactive arthritis ([place]) 08/01/2017  . Other microscopic hematuria 08/01/2017  . PSA elevation 01/30/2017  . Mild episode of recurrent major depressive disorder ([place]) 01/30/2017  . Hypertriglyceridemia 11/26/2014  . Dyslipidemia, goal LDL below 130 12/24/2011  . Routine general medical examination at a health care facility 11/05/2011  . Hypogonadism male 11/05/2011    Current Outpatient Medications  Medication Sig Dispense Refill  . fenofibrate 160 MG tablet TAKE 1 TABLET BY MOUTH ONCE DAILY 90 tablet 0  . ibuprofen (ADVIL,MOTRIN) 600 MG tablet Take 1 tablet (600 mg total) by mouth every 6 (six) hours as needed. (Patient not taking: Reported on 05/30/2018) 30 tablet 1  . latanoprost (XALATAN) 0.005 % ophthalmic solution instill 1 drop INTO AFFECTED EYE ONCE EVERY EVENING  0  . rosuvastatin (CRESTOR) 5 MG tablet TAKE 1 TABLET BY MOUTH AT BEDTIME 90 tablet 1   Current Facility-Administered Medications  Medication Dose Route Frequency Provider Last Rate Last Dose  . 0.9 %  sodium chloride infusion  500 mL Intravenous Once Armbruster, [name], MD        Allergies: Lovaza [omega-3-acid ethyl esters]; Asa [aspirin]; Hydrocodone; and Penicillins  Past Medical History:  Diagnosis Date  . Anxiety   . Depression   . Glaucoma   . Hyperlipidemia   . Hyperplastic colon polyp   . Hypogonadism male 2010  . Kidney stones   . Migraine     Past Surgical History:  Procedure Laterality Date  . COLONOSCOPY  08/18/2009   [place]  . PENECTOMY     for blockage in penis  . WISDOM TOOTH EXTRACTION      Family History  Problem Relation Age of Onset  . Arthritis Other   . Stroke Other   . Breast cancer Mother   . Stomach cancer Mother   . Prostate cancer Father   . Skin  cancer Sister   . Leukemia Brother   . Skin cancer Brother   . Colon cancer Neg Hx   . Rectal cancer Neg Hx   . Esophageal cancer Neg Hx   . Colon polyps Neg Hx     Social History   Tobacco Use  . Smoking status: Never Smoker  . Smokeless tobacco: Never Used  Substance Use Topics  . Alcohol use: No    Subjective:  Patient presents with flu-like symptoms including congestion/ achy muscles, sore throat; flu-like symptoms started last Thursday; + cough/ congestion; not taking any OTC medication; using warm salt water gargles/ increased Vitamin C; no fever;      Objective:  Vitals:   09/09/18 1346  BP: 116/80  Pulse: 87  Temp: 98.3 F (36.8 C)  TempSrc: Oral  SpO2: 97%  Weight: 196 lb 0.6 oz (88.9 kg)  Height: 6' 4"  (1.93 m)    General: Well developed, well nourished, in no acute distress  Skin : Warm and dry.  Head: Normocephalic and atraumatic  Eyes: Sclera and conjunctiva clear; pupils round and reactive to light; extraocular movements intact  Ears: External normal; canals clear; tympanic membranes normal  Oropharynx: Pink, supple. No suspicious lesions  Neck: Supple without thyromegaly, adenopathy  Lungs: Respirations unlabored; clear to auscultation bilaterally without wheeze, rales, rhonchi  CVS exam: normal rate and regular rhythm.  Neurologic: Alert and oriented; speech  intact; face symmetrical; moves all extremities well; CNII-XII intact without focal deficit   Assessment:  1. Myalgia     Plan:  Rapid flu in office is negative; physical exam is reassuring; will update labs today to try and determine source of symptoms; in the interim, recommend fluids, rest and Ibuprofen; follow-up to be determined.     No follow-ups on file.  Orders Placed This Encounter  Procedures  . CBC w/Diff    Standing Status:   Future    Number of Occurrences:   1    Standing Expiration Date:   09/09/2019  . Comp Met (CMET)    Standing Status:   Future    Number of Occurrences:    1    Standing Expiration Date:   09/09/2019  . Antinuclear Antib (ANA)    Standing Status:   Future    Number of Occurrences:   1    Standing Expiration Date:   09/09/2019  . Sed Rate (ESR)    Standing Status:   Future    Number of Occurrences:   1    Standing Expiration Date:   09/09/2019  . Rheumatoid Factor    Standing Status:   Future    Number of Occurrences:   1    Standing Expiration Date:   09/09/2019  . POC Influenza A&B (Binax test)    Requested Prescriptions    No prescriptions requested or ordered in this encounter

## 2018-09-10 DIAGNOSIS — M9903 Segmental and somatic dysfunction of lumbar region: Secondary | ICD-10-CM | POA: Diagnosis not present

## 2018-09-10 DIAGNOSIS — M545 Low back pain: Secondary | ICD-10-CM | POA: Diagnosis not present

## 2018-09-10 DIAGNOSIS — M9904 Segmental and somatic dysfunction of sacral region: Secondary | ICD-10-CM | POA: Diagnosis not present

## 2018-09-10 DIAGNOSIS — M9905 Segmental and somatic dysfunction of pelvic region: Secondary | ICD-10-CM | POA: Diagnosis not present

## 2018-09-10 LAB — RHEUMATOID FACTOR: Rhuematoid fact SerPl-aCnc: <14 IU/mL (ref <14)

## 2018-09-10 LAB — ANA: Anti Nuclear Antibody(ANA): NEGATIVE

## 2018-09-17 DIAGNOSIS — M9903 Segmental and somatic dysfunction of lumbar region: Secondary | ICD-10-CM | POA: Diagnosis not present

## 2018-09-17 DIAGNOSIS — M9905 Segmental and somatic dysfunction of pelvic region: Secondary | ICD-10-CM | POA: Diagnosis not present

## 2018-09-17 DIAGNOSIS — M9904 Segmental and somatic dysfunction of sacral region: Secondary | ICD-10-CM | POA: Diagnosis not present

## 2018-09-17 DIAGNOSIS — M545 Low back pain: Secondary | ICD-10-CM | POA: Diagnosis not present

## 2018-09-30 DIAGNOSIS — M9905 Segmental and somatic dysfunction of pelvic region: Secondary | ICD-10-CM | POA: Diagnosis not present

## 2018-09-30 DIAGNOSIS — M545 Low back pain: Secondary | ICD-10-CM | POA: Diagnosis not present

## 2018-09-30 DIAGNOSIS — M9904 Segmental and somatic dysfunction of sacral region: Secondary | ICD-10-CM | POA: Diagnosis not present

## 2018-09-30 DIAGNOSIS — M9903 Segmental and somatic dysfunction of lumbar region: Secondary | ICD-10-CM | POA: Diagnosis not present

## 2018-10-02 DIAGNOSIS — M9903 Segmental and somatic dysfunction of lumbar region: Secondary | ICD-10-CM | POA: Diagnosis not present

## 2018-10-02 DIAGNOSIS — M545 Low back pain: Secondary | ICD-10-CM | POA: Diagnosis not present

## 2018-10-02 DIAGNOSIS — M9904 Segmental and somatic dysfunction of sacral region: Secondary | ICD-10-CM | POA: Diagnosis not present

## 2018-10-02 DIAGNOSIS — M9905 Segmental and somatic dysfunction of pelvic region: Secondary | ICD-10-CM | POA: Diagnosis not present

## 2018-10-15 DIAGNOSIS — M542 Cervicalgia: Secondary | ICD-10-CM | POA: Diagnosis not present

## 2018-10-15 DIAGNOSIS — G44209 Tension-type headache, unspecified, not intractable: Secondary | ICD-10-CM | POA: Diagnosis not present

## 2018-10-15 DIAGNOSIS — M546 Pain in thoracic spine: Secondary | ICD-10-CM | POA: Diagnosis not present

## 2018-10-15 DIAGNOSIS — M545 Low back pain: Secondary | ICD-10-CM | POA: Diagnosis not present

## 2018-10-27 ENCOUNTER — Other Ambulatory Visit: Payer: Self-pay | Admitting: Internal Medicine

## 2018-10-27 DIAGNOSIS — E785 Hyperlipidemia, unspecified: Secondary | ICD-10-CM

## 2018-10-29 DIAGNOSIS — M545 Low back pain: Secondary | ICD-10-CM | POA: Diagnosis not present

## 2018-10-29 DIAGNOSIS — M546 Pain in thoracic spine: Secondary | ICD-10-CM | POA: Diagnosis not present

## 2018-10-29 DIAGNOSIS — M542 Cervicalgia: Secondary | ICD-10-CM | POA: Diagnosis not present

## 2018-10-29 DIAGNOSIS — G44209 Tension-type headache, unspecified, not intractable: Secondary | ICD-10-CM | POA: Diagnosis not present

## 2018-11-10 DIAGNOSIS — M546 Pain in thoracic spine: Secondary | ICD-10-CM | POA: Diagnosis not present

## 2018-11-10 DIAGNOSIS — M545 Low back pain: Secondary | ICD-10-CM | POA: Diagnosis not present

## 2018-11-10 DIAGNOSIS — M542 Cervicalgia: Secondary | ICD-10-CM | POA: Diagnosis not present

## 2018-11-10 DIAGNOSIS — G44209 Tension-type headache, unspecified, not intractable: Secondary | ICD-10-CM | POA: Diagnosis not present

## 2018-11-14 ENCOUNTER — Other Ambulatory Visit: Payer: Self-pay | Admitting: Internal Medicine

## 2018-11-14 DIAGNOSIS — E781 Pure hyperglyceridemia: Secondary | ICD-10-CM

## 2018-11-14 MED ORDER — FENOFIBRATE 160 MG PO TABS: 160.0000 mg | ORAL_TABLET | Freq: Every day | ORAL | 0 refills | Status: DC

## 2018-11-19 DIAGNOSIS — M546 Pain in thoracic spine: Secondary | ICD-10-CM | POA: Diagnosis not present

## 2018-11-19 DIAGNOSIS — M545 Low back pain: Secondary | ICD-10-CM | POA: Diagnosis not present

## 2018-11-19 DIAGNOSIS — M542 Cervicalgia: Secondary | ICD-10-CM | POA: Diagnosis not present

## 2018-11-19 DIAGNOSIS — G44209 Tension-type headache, unspecified, not intractable: Secondary | ICD-10-CM | POA: Diagnosis not present

## 2018-12-09 DIAGNOSIS — H401133 Primary open-angle glaucoma, bilateral, severe stage: Secondary | ICD-10-CM | POA: Diagnosis not present

## 2019-01-06 DIAGNOSIS — G44209 Tension-type headache, unspecified, not intractable: Secondary | ICD-10-CM | POA: Diagnosis not present

## 2019-01-06 DIAGNOSIS — M545 Low back pain: Secondary | ICD-10-CM | POA: Diagnosis not present

## 2019-01-06 DIAGNOSIS — M9901 Segmental and somatic dysfunction of cervical region: Secondary | ICD-10-CM | POA: Diagnosis not present

## 2019-01-06 DIAGNOSIS — M542 Cervicalgia: Secondary | ICD-10-CM | POA: Diagnosis not present

## 2019-01-27 DIAGNOSIS — G44209 Tension-type headache, unspecified, not intractable: Secondary | ICD-10-CM | POA: Diagnosis not present

## 2019-01-27 DIAGNOSIS — M542 Cervicalgia: Secondary | ICD-10-CM | POA: Diagnosis not present

## 2019-01-27 DIAGNOSIS — M9901 Segmental and somatic dysfunction of cervical region: Secondary | ICD-10-CM | POA: Diagnosis not present

## 2019-01-27 DIAGNOSIS — M545 Low back pain: Secondary | ICD-10-CM | POA: Diagnosis not present

## 2019-02-13 ENCOUNTER — Other Ambulatory Visit: Payer: Self-pay | Admitting: Internal Medicine

## 2019-02-13 DIAGNOSIS — E781 Pure hyperglyceridemia: Secondary | ICD-10-CM

## 2019-02-13 MED ORDER — FENOFIBRATE 160 MG PO TABS: 160.0000 mg | ORAL_TABLET | Freq: Every day | ORAL | 0 refills | Status: DC

## 2019-02-17 DIAGNOSIS — G44209 Tension-type headache, unspecified, not intractable: Secondary | ICD-10-CM | POA: Diagnosis not present

## 2019-02-17 DIAGNOSIS — M542 Cervicalgia: Secondary | ICD-10-CM | POA: Diagnosis not present

## 2019-02-17 DIAGNOSIS — M9901 Segmental and somatic dysfunction of cervical region: Secondary | ICD-10-CM | POA: Diagnosis not present

## 2019-02-17 DIAGNOSIS — M545 Low back pain: Secondary | ICD-10-CM | POA: Diagnosis not present

## 2019-03-11 DIAGNOSIS — M545 Low back pain: Secondary | ICD-10-CM | POA: Diagnosis not present

## 2019-03-11 DIAGNOSIS — M542 Cervicalgia: Secondary | ICD-10-CM | POA: Diagnosis not present

## 2019-03-11 DIAGNOSIS — G44209 Tension-type headache, unspecified, not intractable: Secondary | ICD-10-CM | POA: Diagnosis not present

## 2019-03-11 DIAGNOSIS — M9901 Segmental and somatic dysfunction of cervical region: Secondary | ICD-10-CM | POA: Diagnosis not present

## 2019-03-14 IMAGING — MR MR ANKLE*L* WO/W CM
5 of 10 series · 19 of 40 positions shown · IV contrast (multihance)
Comparison: None.

CLINICAL DATA: Ankle pain and swelling, query septic arthritis.

EXAM:
MRI OF THE LEFT ANKLE WITHOUT AND WITH CONTRAST
TECHNIQUE: Multiplanar, multisequence MR imaging of the ankle was performed
before and after the administration of intravenous contrast.
CONTRAST:  18mL MULTIHANCE GADOBENATE DIMEGLUMINE 529 MG/ML IV SOLN

[Series 2: T1 · axial · 4.0mm · 0.29mm/px · z∈[-80,+99]mm · 4 of 37 slices shown (1 of 2)]
[im 1/37]
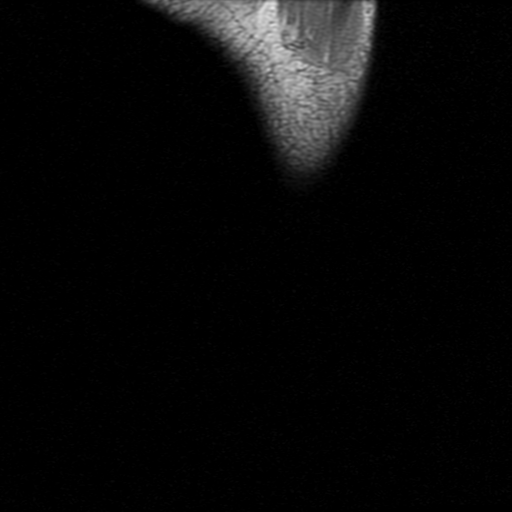
[im 13/37]
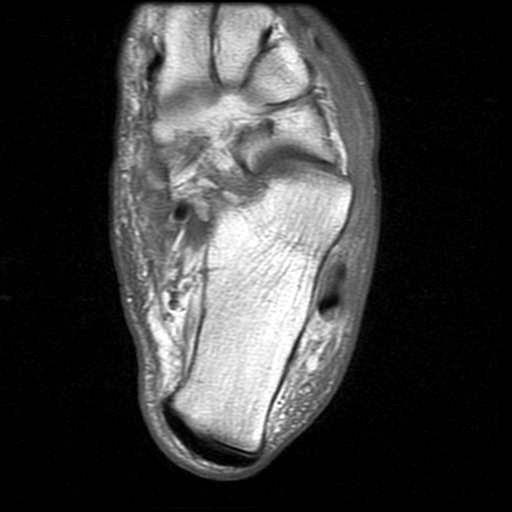
[im 25/37]
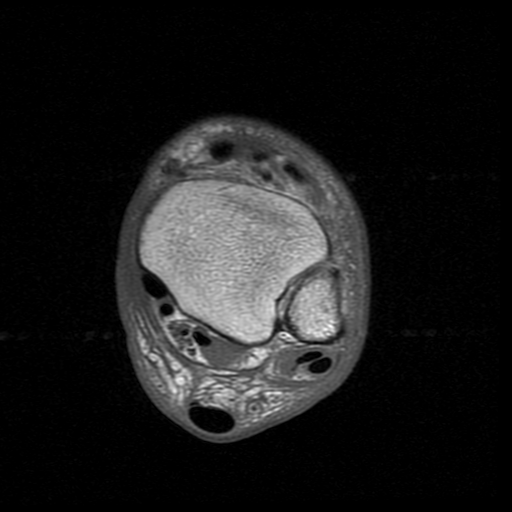
[im 37/37]
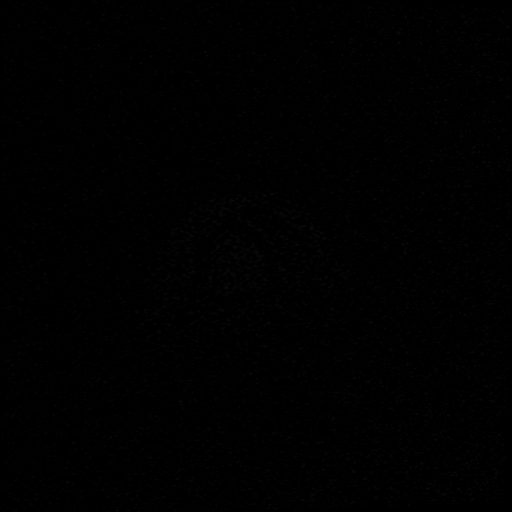

[Series 5: T1 · coronal · 4.0mm · 0.33mm/px · 4 of 29 slices shown (2 of 2)]
[im 1/29]
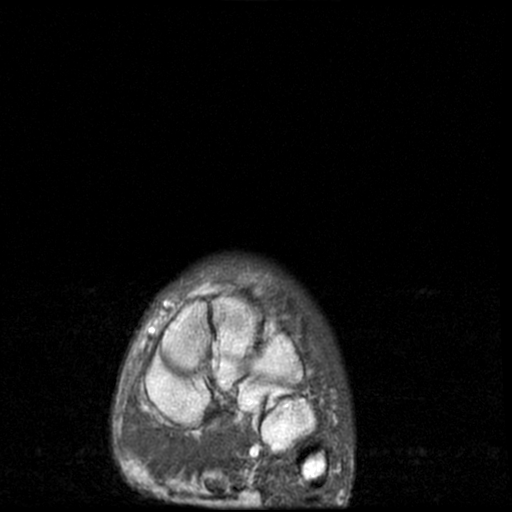
[im 10/29]
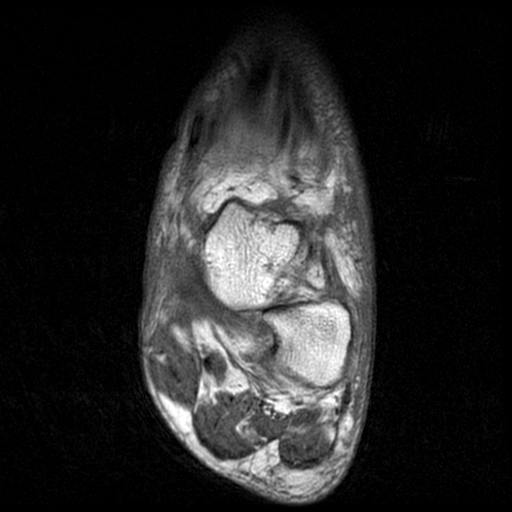
[im 19/29]
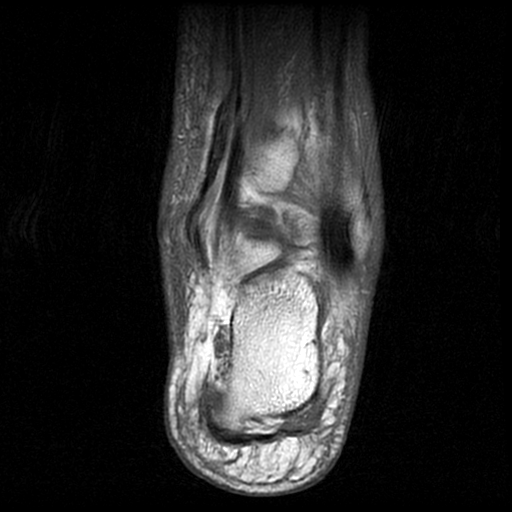
[im 29/29]
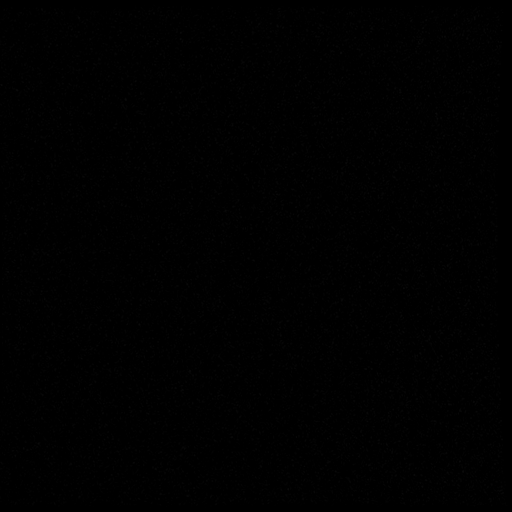

[Series 8: T1 fat-sat post-contrast · axial · 4.0mm · 0.29mm/px · z∈[-80,+99]mm · 5 of 37 slices shown]
[im 1/37]
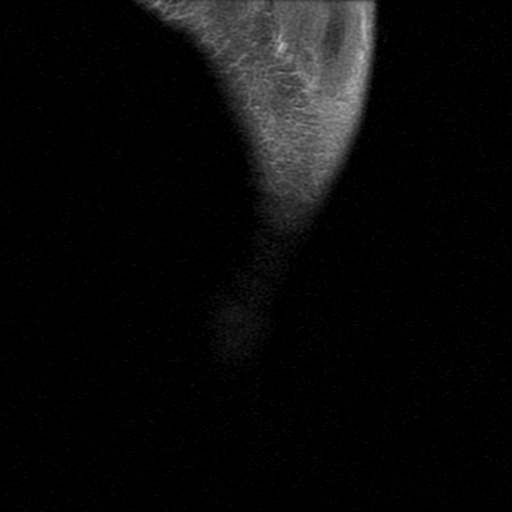
[im 10/37]
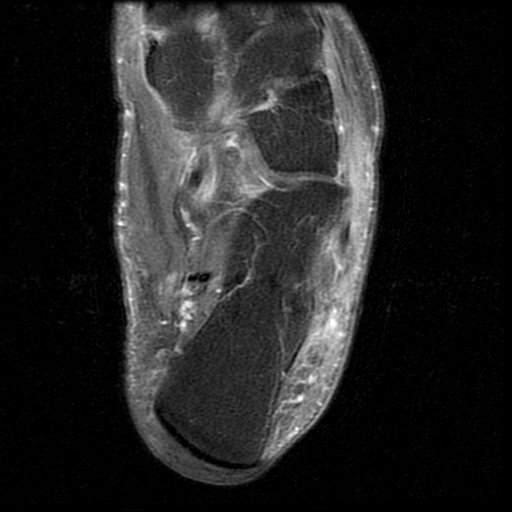
[im 19/37]
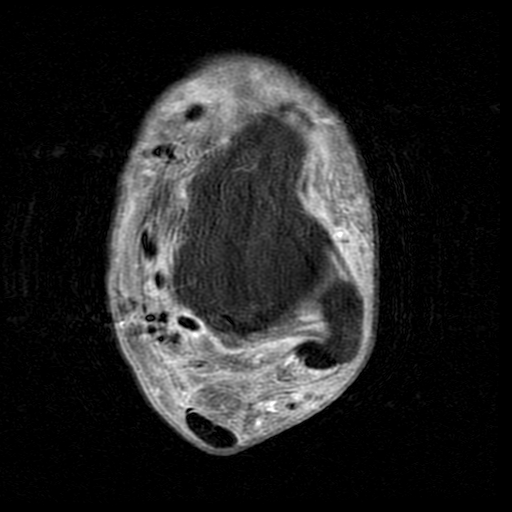
[im 28/37]
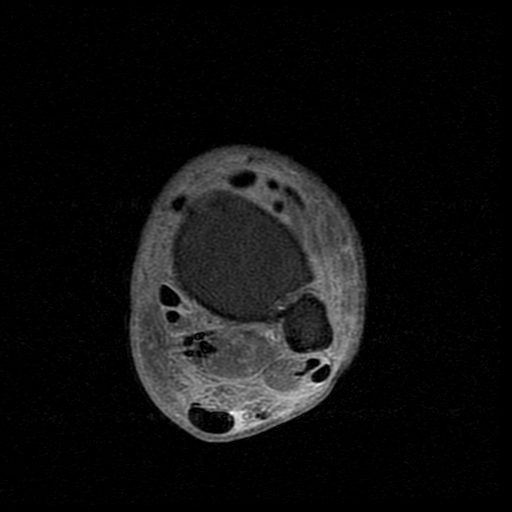
[im 37/37]
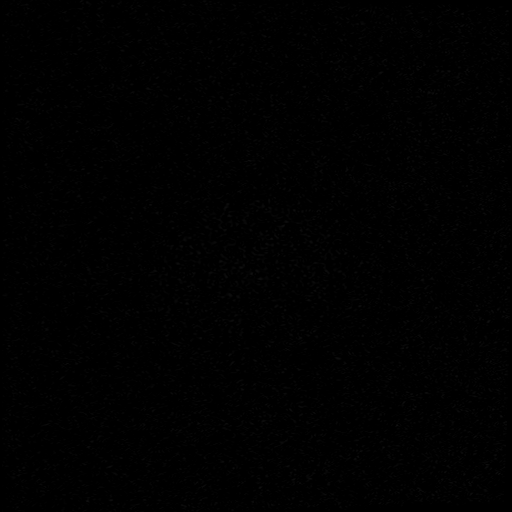

[Series 9: T1 post-contrast · coronal · 4.0mm · 0.33mm/px · 4 of 29 slices shown (1 of 2)]
[im 1/29]
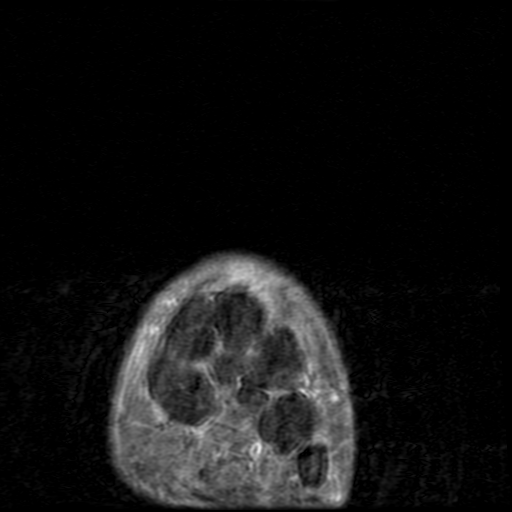
[im 10/29]
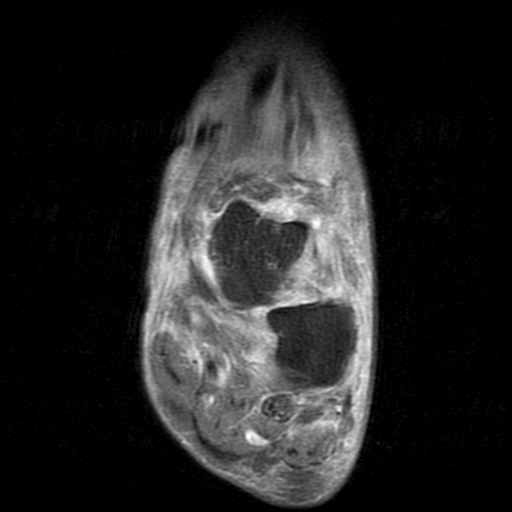
[im 19/29]
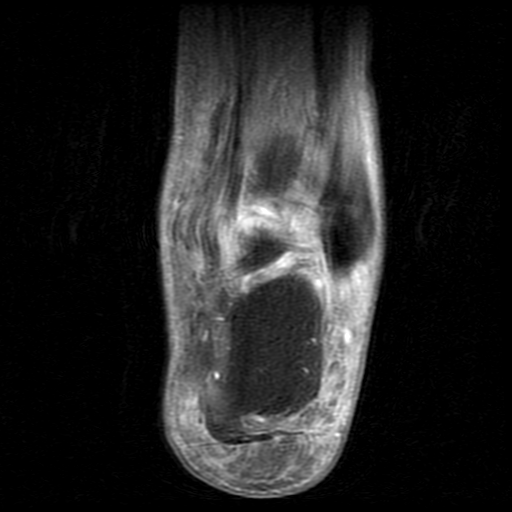
[im 29/29]
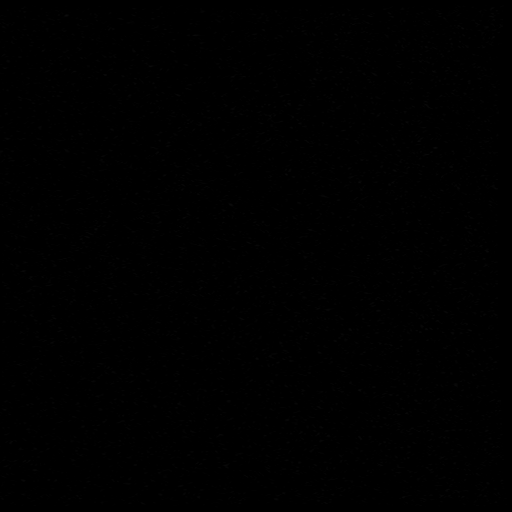

[Series 10: T1 post-contrast · sagittal · 4.0mm · 0.33mm/px · 2 of 21 slices shown (2 of 2)]
[im 1/21]
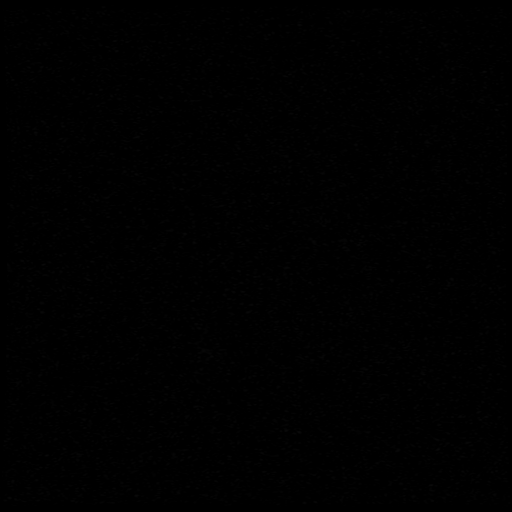
[im 11/21]
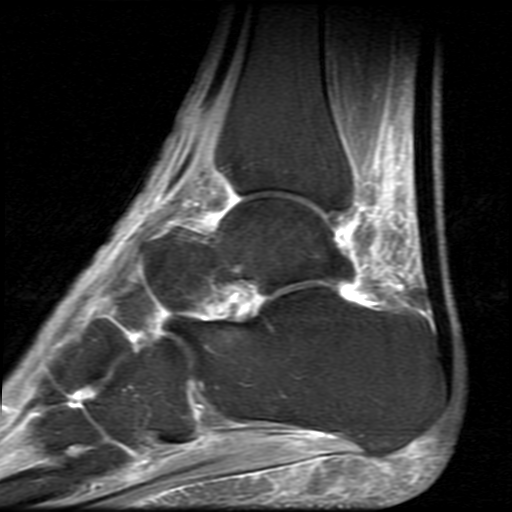

[19 of 40 positions shown; findings below may reference images not displayed]

FINDINGS: TENDONS

Peroneal: Intact peroneus longus and peroneus brevis tendons.

Posteromedial: Insertion of the posterior tibial tendon on a type 2
navicular bone. The flexor hallucis longus and flexor digitorum
longus tendons are normal.

Anterior: Intact tibialis anterior, extensor hallucis longus and
extensor digitorum longus tendons.

Achilles: Intact.

Plantar Fascia: Intact.

LIGAMENTS

Lateral: Intact.

Medial: Intact.

CARTILAGE

Ankle Joint: No focal chondral defect or significant chondrosis. No
joint effusion, significant synovial proliferation or enhancement.

Subtalar Joints/Sinus Tarsi: No joint effusion or chondral defect.

Bones: No subchondral marrow signal abnormalities about the
tibiotalar, subtalar and midfoot articulations to suggest septic
arthritis. No acute fracture or suspicious osseous lesions.

Other: Nonspecific generalized subcutaneous soft tissue edema
possibly related to third spacing or cellulitis.
IMPRESSION: IMPRESSION
1. No joint effusion, subchondral marrow change nor synovial hyper
enhancement to suggest septic arthritis about the ankle joint.
2. Generalized nonspecific subcutaneous soft tissue edema possibly
related to third spacing of fluid, venous insufficiency or
cellulitis.
3. Intact tendons and ligaments crossing the ankle joint.

## 2019-03-17 DIAGNOSIS — M9901 Segmental and somatic dysfunction of cervical region: Secondary | ICD-10-CM | POA: Diagnosis not present

## 2019-03-17 DIAGNOSIS — M542 Cervicalgia: Secondary | ICD-10-CM | POA: Diagnosis not present

## 2019-03-17 DIAGNOSIS — M545 Low back pain: Secondary | ICD-10-CM | POA: Diagnosis not present

## 2019-03-17 DIAGNOSIS — G44209 Tension-type headache, unspecified, not intractable: Secondary | ICD-10-CM | POA: Diagnosis not present

## 2019-03-27 DIAGNOSIS — R972 Elevated prostate specific antigen [PSA]: Secondary | ICD-10-CM | POA: Diagnosis not present

## 2019-03-27 LAB — PSA: PSA: 3.85

## 2019-04-03 DIAGNOSIS — R972 Elevated prostate specific antigen [PSA]: Secondary | ICD-10-CM | POA: Diagnosis not present

## 2019-04-06 DIAGNOSIS — H401133 Primary open-angle glaucoma, bilateral, severe stage: Secondary | ICD-10-CM | POA: Diagnosis not present

## 2019-04-08 ENCOUNTER — Ambulatory Visit (INDEPENDENT_AMBULATORY_CARE_PROVIDER_SITE_OTHER): Payer: BC Managed Care – PPO | Admitting: Internal Medicine

## 2019-04-08 ENCOUNTER — Encounter: Payer: Self-pay | Admitting: Internal Medicine

## 2019-04-08 ENCOUNTER — Other Ambulatory Visit: Payer: Self-pay

## 2019-04-08 ENCOUNTER — Other Ambulatory Visit (INDEPENDENT_AMBULATORY_CARE_PROVIDER_SITE_OTHER): Payer: BC Managed Care – PPO

## 2019-04-08 VITALS — BP 130/70 | HR 86 | Temp 98.4°F | Ht 76.0 in | Wt 203.0 lb

## 2019-04-08 DIAGNOSIS — E781 Pure hyperglyceridemia: Secondary | ICD-10-CM | POA: Diagnosis not present

## 2019-04-08 DIAGNOSIS — R972 Elevated prostate specific antigen [PSA]: Secondary | ICD-10-CM

## 2019-04-08 DIAGNOSIS — M542 Cervicalgia: Secondary | ICD-10-CM | POA: Diagnosis not present

## 2019-04-08 DIAGNOSIS — E785 Hyperlipidemia, unspecified: Secondary | ICD-10-CM

## 2019-04-08 DIAGNOSIS — R7989 Other specified abnormal findings of blood chemistry: Secondary | ICD-10-CM

## 2019-04-08 DIAGNOSIS — Z0000 Encounter for general adult medical examination without abnormal findings: Secondary | ICD-10-CM

## 2019-04-08 DIAGNOSIS — R945 Abnormal results of liver function studies: Secondary | ICD-10-CM

## 2019-04-08 DIAGNOSIS — G44209 Tension-type headache, unspecified, not intractable: Secondary | ICD-10-CM | POA: Diagnosis not present

## 2019-04-08 DIAGNOSIS — R3129 Other microscopic hematuria: Secondary | ICD-10-CM

## 2019-04-08 DIAGNOSIS — M545 Low back pain: Secondary | ICD-10-CM | POA: Diagnosis not present

## 2019-04-08 DIAGNOSIS — M9901 Segmental and somatic dysfunction of cervical region: Secondary | ICD-10-CM | POA: Diagnosis not present

## 2019-04-08 LAB — LIPID PANEL
Cholesterol: 134 mg/dL (ref 0–200)
HDL: 37.5 mg/dL — ABNORMAL LOW (ref >39.00)
LDL Cholesterol: 64 mg/dL (ref 0–99)
NonHDL: 96.84
Total CHOL/HDL Ratio: 4
Triglycerides: 166 mg/dL — ABNORMAL HIGH (ref 0.0–149.0)
VLDL: 33.2 mg/dL (ref 0.0–40.0)

## 2019-04-08 LAB — HEPATIC FUNCTION PANEL
ALT: 59 U/L — ABNORMAL HIGH (ref 0–53)
AST: 40 U/L — ABNORMAL HIGH (ref 0–37)
Albumin: 4.8 g/dL (ref 3.5–5.2)
Alkaline Phosphatase: 46 U/L (ref 39–117)
Bilirubin, Direct: 0.1 mg/dL (ref 0.0–0.3)
Total Bilirubin: 0.7 mg/dL (ref 0.2–1.2)
Total Protein: 7 g/dL (ref 6.0–8.3)

## 2019-04-08 LAB — TSH: TSH: 1.96 u[IU]/mL (ref 0.35–4.50)

## 2019-04-08 LAB — CK: Total CK: 287 U/L — ABNORMAL HIGH (ref 7–232)

## 2019-04-08 NOTE — Patient Instructions (Signed)

## 2019-04-08 NOTE — Progress Notes (Addendum)
Subjective:  Patient ID: Andrew Wright, male    DOB: 1961-02-13  Age: 58 y.o. MRN: [id number]  CC: Hyperlipidemia and Annual Exam   HPI Demaris Leavell presents for a CPX.  He saw his urologist a few weeks ago and his PSA is stable at 3.85.  He complains of weight gain.  He is active and denies any recent episodes of CP, DOE, palpitations, edema, or fatigue.  Outpatient Medications Prior to Visit  Medication Sig Dispense Refill  . fenofibrate 160 MG tablet Take 1 tablet (160 mg total) by mouth daily. 90 tablet 0  . latanoprost (XALATAN) 0.005 % ophthalmic solution instill 1 drop INTO AFFECTED EYE ONCE EVERY EVENING  0  . rosuvastatin (CRESTOR) 5 MG tablet TAKE 1 TABLET BY MOUTH AT BEDTIME 90 tablet 1   No facility-administered medications prior to visit.     ROS Review of Systems  Constitutional: Positive for unexpected weight change. Negative for chills, diaphoresis and fatigue.  HENT: Negative.   Eyes: Negative for visual disturbance.  Respiratory: Negative for cough, chest tightness, shortness of breath and wheezing.   Cardiovascular: Negative for palpitations and leg swelling.  Gastrointestinal: Negative for abdominal pain, constipation, diarrhea, nausea and vomiting.  Endocrine: Negative.   Genitourinary: Negative.  Negative for difficulty urinating.  Musculoskeletal: Negative for arthralgias, joint swelling and myalgias.  Skin: Negative.  Negative for color change.  Neurological: Negative.  Negative for dizziness, weakness and light-headedness.  Hematological: Negative for adenopathy. Does not bruise/bleed easily.  Psychiatric/Behavioral: Negative.     Objective:  BP 130/70 (BP Location: Left Arm, Patient Position: Sitting, Cuff Size: Large)   Pulse 86   Temp 98.4 F (36.9 C) (Oral)   Ht 6' 4"  (1.93 m)   Wt 203 lb (92.1 kg)   SpO2 97%   BMI 24.71 kg/m   BP Readings from Last 3 Encounters:  04/08/19 130/70  09/09/18 116/80  06/10/18 104/65    Wt Readings  from Last 3 Encounters:  04/08/19 203 lb (92.1 kg)  09/09/18 196 lb 0.6 oz (88.9 kg)  06/10/18 193 lb (87.5 kg)    Physical Exam Vitals signs reviewed.  Constitutional:      Appearance: He is not ill-appearing or diaphoretic.  HENT:     Nose: Nose normal. No congestion or rhinorrhea.     Mouth/Throat:     Mouth: Mucous membranes are moist.  Eyes:     General: No scleral icterus.    Conjunctiva/sclera: Conjunctivae normal.  Neck:     Musculoskeletal: Normal range of motion. No neck rigidity.  Cardiovascular:     Rate and Rhythm: Normal rate and regular rhythm.     Heart sounds: No murmur.  Pulmonary:     Breath sounds: No stridor. No wheezing, rhonchi or rales.  Abdominal:     General: Abdomen is flat. Bowel sounds are normal. There is no distension.     Palpations: There is no hepatomegaly or splenomegaly.     Tenderness: There is no abdominal tenderness.  Musculoskeletal: Normal range of motion.     Right lower leg: No edema.     Left lower leg: No edema.  Lymphadenopathy:     Cervical: No cervical adenopathy.  Skin:    General: Skin is warm and dry.     Coloration: Skin is not pale.  Neurological:     General: No focal deficit present.     Mental Status: He is alert and oriented to person, place, and time. Mental status is at baseline.  Psychiatric:        Mood and Affect: Mood normal.        Behavior: Behavior normal.        Thought Content: Thought content normal.        Judgment: Judgment normal.     Lab Results  Component Value Date   WBC 6.5 09/09/2018   HGB 14.4 09/09/2018   HCT 41.9 09/09/2018   PLT 259.0 09/09/2018   GLUCOSE 90 09/09/2018   CHOL 134 04/08/2019   TRIG 166.0 (H) 04/08/2019   HDL 37.50 (L) 04/08/2019   LDLDIRECT 115.0 01/16/2016   LDLCALC 64 04/08/2019   ALT 59 (H) 04/08/2019   AST 40 (H) 04/08/2019   NA 141 09/09/2018   K 4.1 09/09/2018   CL 105 09/09/2018   CREATININE 1.03 09/09/2018   BUN 13 09/09/2018   CO2 29 09/09/2018    TSH 1.96 04/08/2019   PSA 3.85 03/27/2019    Dg Chest 2 View  Result Date: 05/24/2018 CLINICAL DATA:  Chest pain. EXAM: CHEST - 2 VIEW COMPARISON:  Radiographs of August 01, 2017. FINDINGS: The heart size and mediastinal contours are within normal limits. Both lungs are clear. No pneumothorax or pleural effusion is noted. The visualized skeletal structures are unremarkable. IMPRESSION: No active cardiopulmonary disease. Electronically Signed   By: Marijo Conception, M.D.   On: 05/24/2018 17:48    Assessment & Plan:   Gregory was seen today for hyperlipidemia and annual exam.  Diagnoses and all orders for this visit:  Other microscopic hematuria  PSA elevation -     Cancel: PSA, total and free; Future  Routine general medical examination at a health care facility- Exam completed, labs reviewed, vaccines reviewed, colon cancer screening is up-to-date, patient education material was given. -     Lipid panel; Future  Dyslipidemia, goal LDL below 130- He has achieved his LDL goal is doing well on the statin. -     TSH; Future -     Hepatic function panel; Future -     CK; Future  Hypertriglyceridemia- His triglycerides are mildly elevated at 166.  He has a slightly elevated CPK level but he does not complain of myalgias.  I have asked him to consider changing from a fibric acid derivative to a fish oil supplement but have not heard back from them at the time of this dictation.  Since he is tolerating the combination of a statin fabric acid derivative for now will continue.  -     Hepatic function panel; Future -     CK; Future  Elevated LFTs- I have asked him to undergo a liver ultrasound to see if he has NASH.  I have also recommended that he get vaccinated against hepatitis A and B. -     US Abdomen Limited RUQ; Future   I am having [name] maintain his latanoprost, rosuvastatin, and fenofibrate.  No orders of the defined types were placed in this encounter.    Follow-up:  Return in about 1 year (around 04/07/2020).  [name], MD

## 2019-04-10 ENCOUNTER — Encounter: Payer: Self-pay | Admitting: Internal Medicine

## 2019-04-17 ENCOUNTER — Encounter: Payer: Self-pay | Admitting: Internal Medicine

## 2019-04-17 ENCOUNTER — Ambulatory Visit
Admission: RE | Admit: 2019-04-17 | Discharge: 2019-04-17 | Disposition: A | Discharge status: Home / Self Care | Payer: BC Managed Care – PPO | Source: Ambulatory Visit | Attending: Internal Medicine | Admitting: Internal Medicine

## 2019-04-17 ENCOUNTER — Other Ambulatory Visit: Payer: Self-pay | Admitting: Internal Medicine

## 2019-04-17 DIAGNOSIS — K7689 Other specified diseases of liver: Secondary | ICD-10-CM | POA: Diagnosis not present

## 2019-04-17 DIAGNOSIS — K7581 Nonalcoholic steatohepatitis (NASH): Secondary | ICD-10-CM

## 2019-04-17 DIAGNOSIS — R7989 Other specified abnormal findings of blood chemistry: Secondary | ICD-10-CM

## 2019-04-17 MED ORDER — PIOGLITAZONE HCL 15 MG PO TABS: 15.0000 mg | ORAL_TABLET | Freq: Every day | ORAL | 1 refills | Status: DC

## 2019-04-20 ENCOUNTER — Other Ambulatory Visit: Payer: Self-pay | Admitting: Internal Medicine

## 2019-04-20 DIAGNOSIS — E781 Pure hyperglyceridemia: Secondary | ICD-10-CM

## 2019-04-20 MED ORDER — VASCEPA 1 G PO CAPS: 2.0000 | ORAL_CAPSULE | Freq: Two times a day (BID) | ORAL | 1 refills | Status: DC

## 2019-05-05 ENCOUNTER — Other Ambulatory Visit: Payer: Self-pay | Admitting: Internal Medicine

## 2019-05-05 DIAGNOSIS — E785 Hyperlipidemia, unspecified: Secondary | ICD-10-CM

## 2019-05-05 MED ORDER — ROSUVASTATIN CALCIUM 5 MG PO TABS: 5.0000 mg | ORAL_TABLET | Freq: Every day | ORAL | 3 refills | Status: DC

## 2019-05-05 NOTE — Telephone Encounter (Signed)
Medication Refill - Medication: rosuvastatin (CRESTOR) 5 MG tablet  Has the patient contacted their pharmacy? Yes - states they haven't heard from [place].  Pt only has two pills left (Agent: If no, request that the patient contact the pharmacy for the refill.) (Agent: If yes, when and what did the pharmacy advise?)  Preferred Pharmacy (with phone number or street name):  [id number] - [place], [place] [address] AT [place] [phone] (Phone) [phone] (Fax)   Agent: Please be advised that RX refills may take up to 3 business days. We ask that you follow-up with your pharmacy.

## 2019-05-05 NOTE — Telephone Encounter (Signed)
Requested Prescriptions  Pending Prescriptions Disp Refills  . rosuvastatin (CRESTOR) 5 MG tablet 90 tablet 3    Sig: Take 1 tablet (5 mg total) by mouth at bedtime.     Cardiovascular:  Antilipid - Statins Failed - 05/05/2019 12:09 PM      Failed - HDL in normal range and within 360 days    HDL  Date Value Ref Range Status  04/08/2019 37.50 (L) >39.00 mg/dL Final         Failed - Triglycerides in normal range and within 360 days    Triglycerides  Date Value Ref Range Status  04/08/2019 166.0 (H) 0.0 - 149.0 mg/dL Final    Comment:    Normal:  <150 mg/dLBorderline High:  150 - 199 mg/dL         Passed - Total Cholesterol in normal range and within 360 days    Cholesterol  Date Value Ref Range Status  04/08/2019 134 0 - 200 mg/dL Final    Comment:    ATP III Classification       Desirable:  < 200 mg/dL               Borderline High:  200 - 239 mg/dL          High:  > = 240 mg/dL         Passed - LDL in normal range and within 360 days    LDL Cholesterol  Date Value Ref Range Status  04/08/2019 64 0 - 99 mg/dL Final         Passed - Patient is not pregnant      Passed - Valid encounter within last 12 months    Recent Outpatient Visits          3 weeks ago Other microscopic hematuria   Wright, MD   7 months ago [place], Marvis Repress, [place]   11 months ago Atypical chest pain   [facility], Marvis [facility], [place]   1 year ago Other microscopic hematuria   [place], Thomas L, MD   1 year ago Reactive arthritis [facility])   [facility] -Mayer Camel, MD

## 2019-05-13 DIAGNOSIS — G44209 Tension-type headache, unspecified, not intractable: Secondary | ICD-10-CM | POA: Diagnosis not present

## 2019-05-13 DIAGNOSIS — M545 Low back pain: Secondary | ICD-10-CM | POA: Diagnosis not present

## 2019-05-13 DIAGNOSIS — M9901 Segmental and somatic dysfunction of cervical region: Secondary | ICD-10-CM | POA: Diagnosis not present

## 2019-05-13 DIAGNOSIS — M542 Cervicalgia: Secondary | ICD-10-CM | POA: Diagnosis not present

## 2019-05-26 DIAGNOSIS — M542 Cervicalgia: Secondary | ICD-10-CM | POA: Diagnosis not present

## 2019-05-26 DIAGNOSIS — M9904 Segmental and somatic dysfunction of sacral region: Secondary | ICD-10-CM | POA: Diagnosis not present

## 2019-05-26 DIAGNOSIS — M5416 Radiculopathy, lumbar region: Secondary | ICD-10-CM | POA: Diagnosis not present

## 2019-05-26 DIAGNOSIS — M9903 Segmental and somatic dysfunction of lumbar region: Secondary | ICD-10-CM | POA: Diagnosis not present

## 2019-05-27 DIAGNOSIS — Z23 Encounter for immunization: Secondary | ICD-10-CM | POA: Diagnosis not present

## 2019-07-02 DIAGNOSIS — M542 Cervicalgia: Secondary | ICD-10-CM | POA: Diagnosis not present

## 2019-07-02 DIAGNOSIS — M9904 Segmental and somatic dysfunction of sacral region: Secondary | ICD-10-CM | POA: Diagnosis not present

## 2019-07-02 DIAGNOSIS — M5416 Radiculopathy, lumbar region: Secondary | ICD-10-CM | POA: Diagnosis not present

## 2019-07-02 DIAGNOSIS — M9903 Segmental and somatic dysfunction of lumbar region: Secondary | ICD-10-CM | POA: Diagnosis not present

## 2019-07-09 DIAGNOSIS — M9903 Segmental and somatic dysfunction of lumbar region: Secondary | ICD-10-CM | POA: Diagnosis not present

## 2019-07-09 DIAGNOSIS — M5416 Radiculopathy, lumbar region: Secondary | ICD-10-CM | POA: Diagnosis not present

## 2019-07-09 DIAGNOSIS — M9904 Segmental and somatic dysfunction of sacral region: Secondary | ICD-10-CM | POA: Diagnosis not present

## 2019-07-09 DIAGNOSIS — M542 Cervicalgia: Secondary | ICD-10-CM | POA: Diagnosis not present

## 2019-08-03 DIAGNOSIS — M9902 Segmental and somatic dysfunction of thoracic region: Secondary | ICD-10-CM | POA: Diagnosis not present

## 2019-08-03 DIAGNOSIS — M545 Low back pain: Secondary | ICD-10-CM | POA: Diagnosis not present

## 2019-08-03 DIAGNOSIS — M9901 Segmental and somatic dysfunction of cervical region: Secondary | ICD-10-CM | POA: Diagnosis not present

## 2019-08-03 DIAGNOSIS — M542 Cervicalgia: Secondary | ICD-10-CM | POA: Diagnosis not present

## 2019-08-04 DIAGNOSIS — H401133 Primary open-angle glaucoma, bilateral, severe stage: Secondary | ICD-10-CM | POA: Diagnosis not present

## 2019-08-25 DIAGNOSIS — M9902 Segmental and somatic dysfunction of thoracic region: Secondary | ICD-10-CM | POA: Diagnosis not present

## 2019-08-25 DIAGNOSIS — M542 Cervicalgia: Secondary | ICD-10-CM | POA: Diagnosis not present

## 2019-08-25 DIAGNOSIS — M545 Low back pain: Secondary | ICD-10-CM | POA: Diagnosis not present

## 2019-08-25 DIAGNOSIS — M9901 Segmental and somatic dysfunction of cervical region: Secondary | ICD-10-CM | POA: Diagnosis not present

## 2019-10-12 ENCOUNTER — Telehealth: Payer: Self-pay

## 2019-10-12 ENCOUNTER — Other Ambulatory Visit: Payer: Self-pay | Admitting: Internal Medicine

## 2019-10-12 DIAGNOSIS — E781 Pure hyperglyceridemia: Secondary | ICD-10-CM

## 2019-10-12 DIAGNOSIS — K7581 Nonalcoholic steatohepatitis (NASH): Secondary | ICD-10-CM

## 2019-10-12 NOTE — Telephone Encounter (Signed)
REFILLS: VASCEPA 1G CAPS & ACTOS 15 MG tablet [facility] on file confirmed. Pt states pharmacy told him to contact provider for refills.

## 2019-10-13 ENCOUNTER — Encounter: Payer: Self-pay | Admitting: Internal Medicine

## 2019-10-13 MED ORDER — ICOSAPENT ETHYL 1 G PO CAPS: 2.0000 g | ORAL_CAPSULE | Freq: Two times a day (BID) | ORAL | 1 refills | Status: DC

## 2019-10-13 MED ORDER — PIOGLITAZONE HCL 15 MG PO TABS: 15.0000 mg | ORAL_TABLET | Freq: Every day | ORAL | 1 refills | Status: DC

## 2019-10-13 NOTE — Telephone Encounter (Signed)
erx sent as requested.  

## 2019-10-19 ENCOUNTER — Telehealth: Payer: Self-pay

## 2019-10-19 NOTE — Telephone Encounter (Signed)
Key: [id number]

## 2019-10-21 NOTE — Telephone Encounter (Signed)
The PA for Vascepa was denied. Is there an alternative that you would like to change it to.   Risk factors required where not met.

## 2019-10-24 ENCOUNTER — Other Ambulatory Visit: Payer: Self-pay | Admitting: Internal Medicine

## 2019-10-24 DIAGNOSIS — E781 Pure hyperglyceridemia: Secondary | ICD-10-CM

## 2019-10-26 NOTE — Telephone Encounter (Signed)
PCP changed to Lovaza.

## 2019-10-29 ENCOUNTER — Encounter: Payer: Self-pay | Admitting: Internal Medicine

## 2019-10-29 DIAGNOSIS — E781 Pure hyperglyceridemia: Secondary | ICD-10-CM

## 2019-10-30 NOTE — Telephone Encounter (Signed)
New message  Prior authorization Vascepa  Ref # [id number]  Please advise

## 2019-10-30 NOTE — Telephone Encounter (Signed)
PA was denied. Provider changed to Lovaza

## 2019-11-02 NOTE — Telephone Encounter (Signed)
Did you want to send the Lovaza in?  I assumed that it was but I checked just now and I did not see it. I can send if you would like.

## 2019-11-05 NOTE — Telephone Encounter (Signed)
Pharmacy stated that the Vascepa has not come available to them in the generic form.

## 2019-11-13 ENCOUNTER — Encounter: Payer: Self-pay | Admitting: Internal Medicine

## 2019-11-13 DIAGNOSIS — E781 Pure hyperglyceridemia: Secondary | ICD-10-CM

## 2019-11-14 MED ORDER — ICOSAPENT ETHYL 1 G PO CAPS: 2.0000 g | ORAL_CAPSULE | Freq: Two times a day (BID) | ORAL | 1 refills | Status: DC

## 2019-11-16 ENCOUNTER — Telehealth: Payer: Self-pay

## 2019-11-16 ENCOUNTER — Encounter: Payer: Self-pay | Admitting: Internal Medicine

## 2019-11-16 NOTE — Telephone Encounter (Signed)
New message    Prior authorization on icosapent Ethyl (VASCEPA) 1 g capsule

## 2019-11-16 NOTE — Telephone Encounter (Signed)
[place] - [phone]

## 2019-11-18 ENCOUNTER — Encounter: Payer: Self-pay | Admitting: Internal Medicine

## 2019-11-18 NOTE — Telephone Encounter (Signed)
Will send note to patient in the morning:   Vascepa (icosapent) works in the liver and bloodstream to reduce very low-density triglycerides.  Vascepa is used together with other medicines (such as statins) to reduce the risk of heart attack, stroke, and some types of heart problems that need hospitalization in adults.

## 2019-11-18 NOTE — Telephone Encounter (Signed)
  New message    [employer] needs clarification member has prior authorization for icosapent Ethyl (VASCEPA) 1 g capsule that has two denials next step would be to appeal.   This medication needs to order by a specialist. Cardiologist, endocrinologist or Lipid specialist    The patient does not have a full understanding of this medication, [employer] is asking CMA to call to explain to patient.    The appeal fax # [phone]

## 2019-11-18 NOTE — Telephone Encounter (Signed)
Andrew Wright   GD  11/18/19 1:24 PM Note    New message    [employer] needs clarification member has prior authorization for icosapent Ethyl (VASCEPA) 1 g capsule that has two denials next step would be to appeal.   This medication needs to order by a specialist. Cardiologist, endocrinologist or Lipid specialist    The patient does not have a full understanding of this medication, [employer] is asking CMA to call to explain to patient.    The appeal fax # [phone]

## 2019-11-19 ENCOUNTER — Other Ambulatory Visit: Payer: Self-pay | Admitting: Internal Medicine

## 2019-11-19 DIAGNOSIS — E781 Pure hyperglyceridemia: Secondary | ICD-10-CM

## 2019-11-25 ENCOUNTER — Telehealth: Payer: Self-pay

## 2019-11-25 NOTE — Telephone Encounter (Signed)
New message   Need an appeal - MD will need to call [facility]   Medication was given a 30 days it was denial by [employer].   Fax# [phone]  1.Medication Requested:icosapent Ethyl (VASCEPA) 1 g capsule

## 2019-11-26 NOTE — Telephone Encounter (Signed)
Per PCP - the appeal etc has been taken care of.

## 2019-12-01 NOTE — Telephone Encounter (Signed)
New message:   Bryson Ha from [facility] is calling and states we need to send in a appeal for the Vascepa since it has been denied twice by them. He states to send it to fax # [phone].

## 2019-12-02 NOTE — Telephone Encounter (Signed)
Pt is being seen by [facility] and can take over prescription of Vascepa if they choose to.

## 2019-12-08 ENCOUNTER — Encounter: Payer: Self-pay | Admitting: Internal Medicine

## 2019-12-09 NOTE — Telephone Encounter (Signed)
It looks like this message is for a "prescription situation"  I think she may be just stating a fact regarding the appointment information with Dr. Debara Pickett.

## 2019-12-17 ENCOUNTER — Other Ambulatory Visit: Payer: Self-pay

## 2019-12-17 ENCOUNTER — Encounter: Payer: Self-pay | Admitting: Internal Medicine

## 2019-12-17 ENCOUNTER — Ambulatory Visit (INDEPENDENT_AMBULATORY_CARE_PROVIDER_SITE_OTHER): Payer: 59 | Admitting: Internal Medicine

## 2019-12-17 VITALS — BP 140/75 | HR 85 | Ht 75.0 in | Wt 206.6 lb

## 2019-12-17 DIAGNOSIS — E781 Pure hyperglyceridemia: Secondary | ICD-10-CM

## 2019-12-17 DIAGNOSIS — K760 Fatty (change of) liver, not elsewhere classified: Secondary | ICD-10-CM | POA: Diagnosis not present

## 2019-12-17 NOTE — Patient Instructions (Signed)
Medication Instructions:  Your physician recommends that you continue on your current medications as directed. Please refer to the Current Medication list given to you today.  *If you need a refill on your cardiac medications before your next appointment, please call your pharmacy*  Testing/Procedures: Dr. Debara Pickett has ordered a CT coronary calcium score. This test is done at [address]. This is $150 out of pocket.   Coronary CalciumScan A coronary calcium scan is an imaging test used to look for deposits of calcium and other fatty materials (plaques) in the inner lining of the blood vessels of the heart (coronary arteries). These deposits of calcium and plaques can partly clog and narrow the coronary arteries without producing any symptoms or warning signs. This puts a person at risk for a heart attack. This test can detect these deposits before symptoms develop. Tell a health care provider about:  Any allergies you have.  All medicines you are taking, including vitamins, herbs, eye drops, creams, and over-the-counter medicines.  Any problems you or family members have had with anesthetic medicines.  Any blood disorders you have.  Any surgeries you have had.  Any medical conditions you have.  Whether you are pregnant or may be pregnant. What are the risks? Generally, this is a safe procedure. However, problems may occur, including:  Harm to a pregnant woman and her unborn baby. This test involves the use of radiation. Radiation exposure can be dangerous to a pregnant woman and her unborn baby. If you are pregnant, you generally should not have this procedure done.  Slight increase in the risk of cancer. This is because of the radiation involved in the test. What happens before the procedure? No preparation is needed for this procedure. What happens during the procedure?  You will undress and remove any jewelry around your neck or chest.  You will put on a  hospital gown.  Sticky electrodes will be placed on your chest. The electrodes will be connected to an electrocardiogram (ECG) machine to record a tracing of the electrical activity of your heart.  A CT scanner will take pictures of your heart. During this time, you will be asked to lie still and hold your breath for 2-3 seconds while a picture of your heart is being taken. The procedure may vary among health care providers and hospitals. What happens after the procedure?  You can get dressed.  You can return to your normal activities.  It is up to you to get the results of your test. Ask your health care provider, or the department that is doing the test, when your results will be ready. Summary  A coronary calcium scan is an imaging test used to look for deposits of calcium and other fatty materials (plaques) in the inner lining of the blood vessels of the heart (coronary arteries).  Generally, this is a safe procedure. Tell your health care provider if you are pregnant or may be pregnant.  No preparation is needed for this procedure.  A CT scanner will take pictures of your heart.  You can return to your normal activities after the scan is done. This information is not intended to replace advice given to you by your health care provider. Make sure you discuss any questions you have with your health care provider. Document Released: 02/09/2008 Document Revised: 07/02/2016 Document Reviewed: 07/02/2016 Elsevier Interactive Patient Education  2017 [employer].     Follow-Up: At [facility], you and your health needs are  our priority.  As part of our continuing mission to provide you with exceptional heart care, we have created designated Provider Care Teams.  These Care Teams include your primary Cardiologist (physician) and Advanced Practice Providers (APPs -  Physician Assistants and Nurse Practitioners) who all work together to provide you with the care you need, when you need  it.  We recommend signing up for the patient portal called "MyChart".  Sign up information is provided on this After Visit Summary.  MyChart is used to connect with patients for Virtual Visits (Telemedicine).  Patients are able to view lab/test results, encounter notes, upcoming appointments, etc.  Non-urgent messages can be sent to your provider as well.   To learn more about what you can do with MyChart, go to [email or web address].    Your next appointment:   After calcium score test   The format for your next appointment:   Either In Person or Virtual  Provider:   K. Mali Hilty, MD - lipid   Other Instructions

## 2019-12-17 NOTE — Progress Notes (Signed)
LIPID CLINIC CONSULT NOTE  Chief Complaint:  High triglycerides  Primary Care Physician: Janith Lima, MD  Primary Cardiologist:  No primary care provider on file.  HPI:  Andrew Wright is a 59 y.o. male who is being seen today for the evaluation of high triglycerides at the request of Janith Lima, MD.  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  He has a history of elevated triglycerides and elevated liver enzymes with ultrasound evidence of nonalcoholic fatty liver disease.  He has subsequently been placed on Actos and low-dose rosuvastatin.  He was also placed on Vascepa for his elevated triglycerides.  This is caused a significant reduction in his numbers.  2 years ago on medication his total cholesterol was 131, triglycerides 78, HDL 45 and LDL 69.  Prior to that LDL was 127 with triglycerides as high as 222.  More recently he has come off of the Vascepa as his insurance company has refused to pay for it.  Triglycerides have subsequently increased from 78 up to 166.  LDL is still well treated at 64.  He reports no known family history of heart disease.  Both parents had cancer.  He denies diabetes, hypertension, tobacco use, alcohol or other cardiovascular risk factors.  He eats a very healthy diet which is low in saturated fats, a lot of salmon, salads, oatmeal and other fairly healthy foods.  He reports exercise about 3 times a week.  PMHx:  Past Medical History:  Diagnosis Date  . Anxiety   . Depression   . Glaucoma   . Hyperlipidemia   . Hyperplastic colon polyp   . Hypogonadism male 2010  . Kidney stones   . Migraine     Past Surgical History:  Procedure Laterality Date  . COLONOSCOPY  08/18/2009   [place]  . PENECTOMY     for blockage in penis  . WISDOM TOOTH EXTRACTION      FAMHx:  Family History  Problem Relation Age of Onset  . Arthritis Other   . Stroke Other   . Breast cancer Mother   . Stomach  cancer Mother   . Prostate cancer Father   . Skin cancer Sister   . Leukemia Brother   . Skin cancer Brother   . Colon cancer Neg Hx   . Rectal cancer Neg Hx   . Esophageal cancer Neg Hx   . Colon polyps Neg Hx     SOCHx:   reports that he has never smoked. He has never used smokeless tobacco. He reports that he does not drink alcohol or use drugs.  ALLERGIES:  Allergies  Allergen Reactions  . Lovaza [Omega-3-Acid Ethyl Esters] Other (See Comments)    GI upset  . Asa [Aspirin] Other (See Comments)    bleeding  . Hydrocodone Other (See Comments)    Gave patient hallucinations.   . Penicillins Nausea Only    Has patient had a PCN reaction causing immediate rash, facial/tongue/throat swelling, SOB or lightheadedness with hypotension: No Has patient had a PCN reaction causing severe rash involving mucus membranes or skin necrosis: No Has patient had a PCN reaction that required hospitalization No Has patient had a PCN reaction occurring within the last 10 years: No If all of the above answers are "NO", then may proceed with Cephalosporin use.     ROS: Pertinent items noted in HPI and remainder of comprehensive ROS otherwise negative.  HOME MEDS: Current Outpatient Medications on File  Prior to Visit  Medication Sig Dispense Refill  . latanoprost (XALATAN) 0.005 % ophthalmic solution instill 1 drop INTO AFFECTED EYE ONCE EVERY EVENING  0  . pioglitazone (ACTOS) 15 MG tablet Take 1 tablet (15 mg total) by mouth daily. 90 tablet 1  . rosuvastatin (CRESTOR) 5 MG tablet Take 1 tablet (5 mg total) by mouth at bedtime. 90 tablet 3  . [DISCONTINUED] pioglitazone (ACTOS) 15 MG tablet Take 1 tablet (15 mg total) by mouth daily. 90 tablet 1   No current facility-administered medications on file prior to visit.    LABS/IMAGING: No results found for this or any previous visit (from the past 48 hour(s)). No results found.  LIPID PANEL:    Component Value Date/Time   CHOL 134  04/08/2019 0945   TRIG 166.0 (H) 04/08/2019 0945   HDL 37.50 (L) 04/08/2019 0945   CHOLHDL 4 04/08/2019 0945   VLDL 33.2 04/08/2019 0945   LDLCALC 64 04/08/2019 0945   LDLDIRECT 115.0 01/16/2016 1459    WEIGHTS: Wt Readings from Last 3 Encounters:  12/17/19 206 lb 9.6 oz (93.7 kg)  04/08/19 203 lb (92.1 kg)  09/09/18 196 lb 0.6 oz (88.9 kg)    VITALS: BP 140/75   Pulse 85   Ht 6' 3"  (1.905 m)   Wt 206 lb 9.6 oz (93.7 kg)   SpO2 98%   BMI 25.82 kg/m   EXAM: General appearance: alert and no distress Neck: no carotid bruit, no JVD and thyroid not enlarged, symmetric, no tenderness/mass/nodules Lungs: clear to auscultation bilaterally Heart: regular rate and rhythm, S1, S2 normal, no murmur, click, rub or gallop Abdomen: soft, non-tender; bowel sounds normal; no masses,  no organomegaly Extremities: extremities normal, atraumatic, no cyanosis or edema Pulses: 2+ and symmetric Skin: Skin color, texture, turgor normal. No rashes or lesions Neurologic: Grossly normal Psych: Pleasant  EKG: Deferred   ASSESSMENT: 1. Mixed dyslipidemia with high triglycerides 2. NAFLD-with elevated liver enzymes (AST 40, ALT 59)  PLAN: 1.   Andrew Wright has a mixed dyslipidemia with high triglycerides which are well treated on Vascepa however this is not being covered currently by his insurance.  Guideline indications are for triglycerides over 500 or if there is evidence of ASCVD, then there is indication for treatment of triglycerides between 105-500.  I would like to get a coronary calcium score to see if there is any evidence of coronary artery disease.  If this is abnormal I think we could generally recommend continuing the Vascepa, especially since he has had a very good response to it.  Anecdotally, he said he felt better on the medication.  Thanks again for the kind referral.  Follow-up with me afterwards  Pixie Casino, MD, [facility], [place] Director  of the Advanced Lipid Disorders &  Cardiovascular Risk Reduction Clinic Diplomate of the American Board of Clinical Lipidology Attending Cardiologist  Direct Dial: [phone]  Fax: [phone]  Website:  [email or web address] 12/17/2019, 9:27 AM

## 2020-01-05 ENCOUNTER — Ambulatory Visit (INDEPENDENT_AMBULATORY_CARE_PROVIDER_SITE_OTHER)
Admission: RE | Admit: 2020-01-05 | Discharge: 2020-01-05 | Disposition: A | Discharge status: Home / Self Care | Payer: Self-pay | Source: Ambulatory Visit | Attending: Internal Medicine | Admitting: Internal Medicine

## 2020-01-05 ENCOUNTER — Other Ambulatory Visit: Payer: Self-pay

## 2020-01-05 DIAGNOSIS — E781 Pure hyperglyceridemia: Secondary | ICD-10-CM

## 2020-01-08 ENCOUNTER — Other Ambulatory Visit: Payer: Self-pay

## 2020-01-08 ENCOUNTER — Encounter: Payer: Self-pay | Admitting: Internal Medicine

## 2020-01-08 ENCOUNTER — Ambulatory Visit (INDEPENDENT_AMBULATORY_CARE_PROVIDER_SITE_OTHER): Payer: 59 | Admitting: Internal Medicine

## 2020-01-08 VITALS — BP 130/80 | HR 84 | Temp 97.7°F | Ht 75.0 in | Wt 203.2 lb

## 2020-01-08 DIAGNOSIS — K760 Fatty (change of) liver, not elsewhere classified: Secondary | ICD-10-CM | POA: Diagnosis not present

## 2020-01-08 DIAGNOSIS — E781 Pure hyperglyceridemia: Secondary | ICD-10-CM

## 2020-01-08 NOTE — Patient Instructions (Signed)
Medication Instructions:  Your physician recommends that you continue on your current medications as directed. Please refer to the Current Medication list given to you today.  *If you need a refill on your cardiac medications before your next appointment, please call your pharmacy*   Follow-Up: At [facility], you and your health needs are our priority.  As part of our continuing mission to provide you with exceptional heart care, we have created designated Provider Care Teams.  These Care Teams include your primary Cardiologist (physician) and Advanced Practice Providers (APPs -  Physician Assistants and Nurse Practitioners) who all work together to provide you with the care you need, when you need it.  We recommend signing up for the patient portal called "MyChart".  Sign up information is provided on this After Visit Summary.  MyChart is used to connect with patients for Virtual Visits (Telemedicine).  Patients are able to view lab/test results, encounter notes, upcoming appointments, etc.  Non-urgent messages can be sent to your provider as well.   To learn more about what you can do with MyChart, go to [email or web address].    Your next appointment:   AS NEEDED with Dr. Debara Pickett

## 2020-01-08 NOTE — Progress Notes (Signed)
LIPID CLINIC CONSULT NOTE  Chief Complaint:  High triglycerides  Primary Care Physician: Janith Lima, MD  Primary Cardiologist:  No primary care provider on file.  HPI:  Andrew Wright is a 59 y.o. male who is being seen today for the evaluation of high triglycerides at the request of Janith Lima, MD.  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  He has a history of elevated triglycerides and elevated liver enzymes with ultrasound evidence of nonalcoholic fatty liver disease.  He has subsequently been placed on Actos and low-dose rosuvastatin.  He was also placed on Vascepa for his elevated triglycerides.  This is caused a significant reduction in his numbers.  2 years ago on medication his total cholesterol was 131, triglycerides 78, HDL 45 and LDL 69.  Prior to that LDL was 127 with triglycerides as high as 222.  More recently he has come off of the Vascepa as his insurance company has refused to pay for it.  Triglycerides have subsequently increased from 78 up to 166.  LDL is still well treated at 64.  He reports no known family history of heart disease.  Both parents had cancer.  He denies diabetes, hypertension, tobacco use, alcohol or other cardiovascular risk factors.  He eats a very healthy diet which is low in saturated fats, a lot of salmon, salads, oatmeal and other fairly healthy foods.  He reports exercise about 3 times a week.  01/08/2020  Andrew Wright is seen today in follow-up.  I am pleased to report he underwent calcium scoring which was negative.  No extracardiac findings were noted.  His last lipid showed a triglyceride of 166 off of the Vascepa.  Although this is helped him, he does not meet FDA approved criteria to take the medication this is why it has been denied.  Based on that I would discontinue the medication.  Is questionable whether he needs to be on therapy at all.  If he can continue to work on aggressive diet and exercise he  may be able to keep the triglycerides down.  Ultimately might consider going back on fenofibrate however there is no cardiovascular risk reduction that is noted with this.  PMHx:  Past Medical History:  Diagnosis Date  . Anxiety   . Depression   . Glaucoma   . Hyperlipidemia   . Hyperplastic colon polyp   . Hypogonadism male 2010  . Kidney stones   . Migraine     Past Surgical History:  Procedure Laterality Date  . COLONOSCOPY  08/18/2009   [place]  . PENECTOMY     for blockage in penis  . WISDOM TOOTH EXTRACTION      FAMHx:  Family History  Problem Relation Age of Onset  . Arthritis Other   . Stroke Other   . Breast cancer Mother   . Stomach cancer Mother   . Prostate cancer Father   . Skin cancer Sister   . Leukemia Brother   . Skin cancer Brother   . Colon cancer Neg Hx   . Rectal cancer Neg Hx   . Esophageal cancer Neg Hx   . Colon polyps Neg Hx     SOCHx:   reports that he has never smoked. He has never used smokeless tobacco. He reports that he does not drink alcohol or use drugs.  ALLERGIES:  Allergies  Allergen Reactions  . Lovaza [Omega-3-Acid Ethyl Esters] Other (See Comments)    GI  upset  . Asa [Aspirin] Other (See Comments)    bleeding  . Hydrocodone Other (See Comments)    Gave patient hallucinations.   . Penicillins Nausea Only    Has patient had a PCN reaction causing immediate rash, facial/tongue/throat swelling, SOB or lightheadedness with hypotension: No Has patient had a PCN reaction causing severe rash involving mucus membranes or skin necrosis: No Has patient had a PCN reaction that required hospitalization No Has patient had a PCN reaction occurring within the last 10 years: No If all of the above answers are "NO", then may proceed with Cephalosporin use.     ROS: Pertinent items noted in HPI and remainder of comprehensive ROS otherwise negative.  HOME MEDS: Current Outpatient Medications on File Prior to Visit    Medication Sig Dispense Refill  . icosapent Ethyl (VASCEPA) 1 g capsule Take 2 g by mouth 2 (two) times daily.    Marland Kitchen latanoprost (XALATAN) 0.005 % ophthalmic solution instill 1 drop INTO AFFECTED EYE ONCE EVERY EVENING  0  . pioglitazone (ACTOS) 15 MG tablet Take 1 tablet (15 mg total) by mouth daily. 90 tablet 1  . rosuvastatin (CRESTOR) 5 MG tablet Take 1 tablet (5 mg total) by mouth at bedtime. 90 tablet 3  . [DISCONTINUED] pioglitazone (ACTOS) 15 MG tablet Take 1 tablet (15 mg total) by mouth daily. 90 tablet 1   No current facility-administered medications on file prior to visit.    LABS/IMAGING: No results found for this or any previous visit (from the past 48 hour(s)). No results found.  LIPID PANEL:    Component Value Date/Time   CHOL 134 04/08/2019 0945   TRIG 166.0 (H) 04/08/2019 0945   HDL 37.50 (L) 04/08/2019 0945   CHOLHDL 4 04/08/2019 0945   VLDL 33.2 04/08/2019 0945   LDLCALC 64 04/08/2019 0945   LDLDIRECT 115.0 01/16/2016 1459    WEIGHTS: Wt Readings from Last 3 Encounters:  01/08/20 203 lb 3.2 oz (92.2 kg)  12/17/19 206 lb 9.6 oz (93.7 kg)  04/08/19 203 lb (92.1 kg)    VITALS: Ht 6' 3"  (1.905 m)   Wt 203 lb 3.2 oz (92.2 kg)   BMI 25.40 kg/m   EXAM: Deferred  EKG: Deferred   ASSESSMENT: 1. Mixed dyslipidemia with high triglycerides 2. NAFLD-with elevated liver enzymes (AST 40, ALT 59)  PLAN: 1.   Andrew Wright has had a mild increase in his triglycerides however had no coronary calcium.  He is on low-dose statin with minimal elevation in liver enzymes.  I would continue the Crestor.  I do not think there is any indication for Vascepa.  One could consider fenofibrate again however there is no cardiovascular risk reduction and the fact that his calcium score was 0 puts him at a low risk.  Plan follow-up with me as needed.  Pixie Casino, MD, [facility], [place] Director of the Advanced Lipid Disorders &   Cardiovascular Risk Reduction Clinic Diplomate of the American Board of Clinical Lipidology Attending Cardiologist  Direct Dial: [phone]  Fax: [phone]  Website:  [email or web address] 01/08/2020, 3:22 PM

## 2020-04-11 ENCOUNTER — Other Ambulatory Visit: Payer: Self-pay | Admitting: Internal Medicine

## 2020-04-11 DIAGNOSIS — K7581 Nonalcoholic steatohepatitis (NASH): Secondary | ICD-10-CM

## 2020-04-20 ENCOUNTER — Encounter: Payer: Self-pay | Admitting: Internal Medicine

## 2020-04-20 ENCOUNTER — Other Ambulatory Visit: Payer: Self-pay | Admitting: Internal Medicine

## 2020-04-20 DIAGNOSIS — E785 Hyperlipidemia, unspecified: Secondary | ICD-10-CM

## 2020-04-25 ENCOUNTER — Ambulatory Visit (INDEPENDENT_AMBULATORY_CARE_PROVIDER_SITE_OTHER): Payer: 59 | Admitting: Internal Medicine

## 2020-04-25 ENCOUNTER — Encounter: Payer: Self-pay | Admitting: Internal Medicine

## 2020-04-25 ENCOUNTER — Other Ambulatory Visit: Payer: Self-pay

## 2020-04-25 VITALS — BP 142/86 | HR 96 | Temp 98.5°F | Resp 16 | Ht 75.0 in | Wt 202.0 lb

## 2020-04-25 DIAGNOSIS — E785 Hyperlipidemia, unspecified: Secondary | ICD-10-CM

## 2020-04-25 DIAGNOSIS — K7581 Nonalcoholic steatohepatitis (NASH): Secondary | ICD-10-CM | POA: Diagnosis not present

## 2020-04-25 DIAGNOSIS — R030 Elevated blood-pressure reading, without diagnosis of hypertension: Secondary | ICD-10-CM | POA: Diagnosis not present

## 2020-04-25 DIAGNOSIS — Z0000 Encounter for general adult medical examination without abnormal findings: Secondary | ICD-10-CM

## 2020-04-25 DIAGNOSIS — R3129 Other microscopic hematuria: Secondary | ICD-10-CM

## 2020-04-25 DIAGNOSIS — E781 Pure hyperglyceridemia: Secondary | ICD-10-CM

## 2020-04-25 MED ORDER — ROSUVASTATIN CALCIUM 5 MG PO TABS: 5.0000 mg | ORAL_TABLET | Freq: Every day | ORAL | 1 refills | Status: DC

## 2020-04-25 NOTE — Progress Notes (Addendum)
Subjective:  Patient ID: Andrew Wright, male    DOB: [birth year]  Age: 59 y.o. MRN: [id number]  CC: Annual Exam, Hypertension, and Hyperlipidemia  This visit occurred during the SARS-CoV-2 public health emergency.  Safety protocols were in place, including screening questions prior to the visit, additional usage of staff PPE, and extensive cleaning of exam room while observing appropriate contact time as indicated for disinfecting solutions.    HPI Andrew Wright presents for a CPX.  He has felt well recently. Offers no complaints today. He is not taking vascepa.  Outpatient Medications Prior to Visit  Medication Sig Dispense Refill  . latanoprost (XALATAN) 0.005 % ophthalmic solution instill 1 drop INTO AFFECTED EYE ONCE EVERY EVENING  0  . pioglitazone (ACTOS) 15 MG tablet TAKE 1 TABLET(15 MG) BY MOUTH DAILY 90 tablet 1  . rosuvastatin (CRESTOR) 5 MG tablet Take 1 tablet (5 mg total) by mouth at bedtime. 90 tablet 3  . icosapent Ethyl (VASCEPA) 1 g capsule Take 2 g by mouth 2 (two) times daily. (Patient not taking: Reported on 04/25/2020)     No facility-administered medications prior to visit.    ROS Review of Systems  Constitutional: Negative.  Negative for diaphoresis, fatigue and unexpected weight change.  HENT: Negative.  Negative for trouble swallowing.   Eyes: Negative for visual disturbance.  Respiratory: Negative for cough, chest tightness, shortness of breath and wheezing.   Cardiovascular: Negative for chest pain, palpitations and leg swelling.  Gastrointestinal: Negative for abdominal pain, constipation, diarrhea, nausea and vomiting.  Endocrine: Negative.   Genitourinary: Negative.  Negative for difficulty urinating, scrotal swelling and testicular pain.  Musculoskeletal: Negative.  Negative for arthralgias and myalgias.  Skin: Negative.   Neurological: Negative.  Negative for dizziness, weakness, light-headedness and headaches.  Hematological: Negative for  adenopathy. Does not bruise/bleed easily.  Psychiatric/Behavioral: Negative.     Objective:  BP (!) 142/86   Pulse 96   Temp 98.5 F (36.9 C) (Oral)   Resp 16   Ht 6' 3"  (1.905 m)   Wt 202 lb (91.6 kg)   SpO2 96%   BMI 25.25 kg/m   BP Readings from Last 3 Encounters:  04/25/20 (!) 142/86  01/08/20 130/80  12/17/19 140/75    Wt Readings from Last 3 Encounters:  04/25/20 202 lb (91.6 kg)  01/08/20 203 lb 3.2 oz (92.2 kg)  12/17/19 206 lb 9.6 oz (93.7 kg)    Physical Exam Vitals reviewed.  Constitutional:      Appearance: Normal appearance.  HENT:     Nose: Nose normal.     Mouth/Throat:     Mouth: Mucous membranes are moist.  Eyes:     General: No scleral icterus.    Conjunctiva/sclera: Conjunctivae normal.  Cardiovascular:     Rate and Rhythm: Normal rate and regular rhythm.     Heart sounds: Normal heart sounds, S1 normal and S2 normal. No murmur heard.      Comments: EKG - NSR, 82 bpm No LVH Normal EKG Pulmonary:     Effort: Pulmonary effort is normal.     Breath sounds: No stridor. No wheezing, rhonchi or rales.  Abdominal:     General: Abdomen is flat.     Palpations: There is no mass.     Tenderness: There is no abdominal tenderness. There is no guarding.     Hernia: There is no hernia in the left inguinal area or right inguinal area.  Genitourinary:    Pubic Area: No rash.  Penis: Normal. No discharge, swelling or lesions.      Testes: Normal.        Right: Mass, tenderness or swelling not present.        Left: Mass, tenderness or swelling not present.     Epididymis:     Right: Normal. Not inflamed or enlarged. No mass.     Left: Normal. Not inflamed or enlarged. No mass.     Prostate: Enlarged (1+ smooth symm BPH). Not tender and no nodules present.     Rectum: Normal. Guaiac result negative. No mass, tenderness, anal fissure, external hemorrhoid or internal hemorrhoid. Normal anal tone.  Musculoskeletal:        General: Normal range of  motion.     Cervical back: Neck supple.     Right lower leg: No edema.     Left lower leg: No edema.  Lymphadenopathy:     Cervical: No cervical adenopathy.     Lower Body: No right inguinal adenopathy. No left inguinal adenopathy.  Skin:    General: Skin is warm and dry.     Coloration: Skin is not pale.  Neurological:     General: No focal deficit present.     Mental Status: He is alert.  Psychiatric:        Mood and Affect: Mood normal.        Behavior: Behavior normal.     Lab Results  Component Value Date   WBC 5.0 04/25/2020   HGB 15.2 04/25/2020   HCT 44.9 04/25/2020   PLT 241 04/25/2020   GLUCOSE 83 04/25/2020   CHOL 149 04/25/2020   TRIG 251 (H) 04/25/2020   HDL 43 04/25/2020   LDLDIRECT 115.0 01/16/2016   LDLCALC 73 04/25/2020   ALT 58 (H) 04/25/2020   AST 37 (H) 04/25/2020   NA 142 04/25/2020   K 3.9 04/25/2020   CL 103 04/25/2020   CREATININE 0.97 04/25/2020   BUN 15 04/25/2020   CO2 29 04/25/2020   TSH 1.16 04/25/2020   PSA 4.4 01/12/2021   INR 1.0 04/25/2020    CT CARDIAC SCORING  Addendum Date: 01/05/2020   ADDENDUM REPORT: 01/05/2020 09:17 CLINICAL DATA:  Risk stratification EXAM: Coronary Calcium Score TECHNIQUE: The patient was scanned on a [employer] scanner. Axial non-contrast 3 mm slices were carried out through the heart. The data set was analyzed on a dedicated work station and scored using the [place]. FINDINGS: Non-cardiac: See separate report from [facility]. Ascending Aorta: Normal caliber Pericardium: Normal Coronary arteries: Normal origins. IMPRESSION: Coronary calcium score of 0. Electronically Signed   By: Pixie Casino M.D.   On: 01/05/2020 09:17   Result Date: 01/05/2020 EXAM: OVER-READ INTERPRETATION  CT CHEST The following report is an over-read performed by radiologist Dr. Vinnie Langton of [facility], [place] on 01/05/2020. This over-read does not include interpretation of cardiac or coronary anatomy or  pathology. The coronary calcium score/coronary CTA interpretation by the cardiologist is attached. COMPARISON:  None. FINDINGS: Within the visualized portions of the thorax there are no suspicious appearing pulmonary nodules or masses, there is no acute consolidative airspace disease, no pleural effusions, no pneumothorax and no lymphadenopathy. Visualized portions of the upper abdomen are unremarkable. There are no aggressive appearing lytic or blastic lesions noted in the visualized portions of the skeleton. IMPRESSION: No significant incidental noncardiac findings are noted. Electronically Signed: By: Vinnie Langton M.D. On: 01/05/2020 09:13    Assessment & Plan:   Andrew Wright was seen today  for annual exam, hypertension and hyperlipidemia.  Diagnoses and all orders for this visit:  Nonalcoholic steatohepatitis (NASH)- His liver enzymes remain mildly elevated.  Will continue the current dose of pioglitazone.  He will continue to work on his lifestyle modifications. -     Hepatic function panel; Future -     Protime-INR; Future -     Protime-INR -     Hepatic function panel  Dyslipidemia, goal LDL below 130- He has achieved his LDL goal and is doing well on the statin. -     rosuvastatin (CRESTOR) 5 MG tablet; Take 1 tablet (5 mg total) by mouth at bedtime. -     TSH; Future -     TSH  Other microscopic hematuria- His UA today is negative for blood.  Routine general medical examination at a health care facility- Exam completed, labs reviewed, vaccines reviewed and updated, patient education material was given. -     Lipid panel; Future -     PSA; Future -     PSA -     Lipid panel  Hypertriglyceridemia- His triglycerides remain elevated.  He is not taking Vascepa because his insurance would not pay for it.  Elevated blood pressure reading- His blood pressure is mildly elevated today.  Labs are negative for secondary causes or endorgan damage.  I have asked him to return in 3 to 4 months  for blood pressure recheck and in the meantime he will work on his lifestyle modifications. -     CBC with Differential/Platelet; Future -     BASIC METABOLIC PANEL WITH GFR; Future -     TSH; Future -     VITAMIN D 25 Hydroxy (Vit-D Deficiency, Fractures); Future -     Urinalysis, Routine w reflex microscopic; Future -     Urinalysis, Routine w reflex microscopic -     VITAMIN D 25 Hydroxy (Vit-D Deficiency, Fractures) -     TSH -     BASIC METABOLIC PANEL WITH GFR -     CBC with Differential/Platelet  Other orders -     MICROSCOPIC MESSAGE   I have discontinued Gwyndolyn Saxon Spargo's rosuvastatin and icosapent Ethyl. I am also having him maintain his latanoprost.  Meds ordered this encounter  Medications  . DISCONTD: rosuvastatin (CRESTOR) 5 MG tablet    Sig: Take 1 tablet (5 mg total) by mouth at bedtime.    Dispense:  90 tablet    Refill:  1     Follow-up: Return in about 6 months (around 10/24/2020).  [name], MD

## 2020-04-25 NOTE — Patient Instructions (Signed)

## 2020-04-26 LAB — URINALYSIS, ROUTINE W REFLEX MICROSCOPIC
Bacteria, UA: NONE SEEN /HPF
Bilirubin Urine: NEGATIVE
Glucose, UA: NEGATIVE
Hyaline Cast: NONE SEEN /LPF
Ketones, ur: NEGATIVE
Leukocytes,Ua: NEGATIVE
Nitrite: NEGATIVE
Protein, ur: NEGATIVE
Specific Gravity, Urine: 1.015 (ref 1.001–1.03)
Squamous Epithelial / HPF: NONE SEEN /HPF (ref <=5)
WBC, UA: NONE SEEN /HPF (ref 0–5)
pH: 6.5 (ref 5.0–8.0)

## 2020-04-26 LAB — CBC WITH DIFFERENTIAL/PLATELET
Absolute Monocytes: 360 cells/uL (ref 200–950)
Basophils Absolute: 20 cells/uL (ref 0–200)
Basophils Relative: 0.4 %
Eosinophils Absolute: 20 cells/uL (ref 15–500)
Eosinophils Relative: 0.4 %
HCT: 44.9 % (ref 38.5–50.0)
Hemoglobin: 15.2 g/dL (ref 13.2–17.1)
Lymphs Abs: 1030 cells/uL (ref 850–3900)
MCH: 31.7 pg (ref 27.0–33.0)
MCHC: 33.9 g/dL (ref 32.0–36.0)
MCV: 93.5 fL (ref 80.0–100.0)
MPV: 9.7 fL (ref 7.5–12.5)
Monocytes Relative: 7.2 %
Neutro Abs: 3570 cells/uL (ref 1500–7800)
Neutrophils Relative %: 71.4 %
Platelets: 241 10*3/uL (ref 140–400)
RBC: 4.8 10*6/uL (ref 4.20–5.80)
RDW: 12.6 % (ref 11.0–15.0)
Total Lymphocyte: 20.6 %
WBC: 5 10*3/uL (ref 3.8–10.8)

## 2020-04-26 LAB — HEPATIC FUNCTION PANEL
AG Ratio: 2.5 (calc) (ref 1.0–2.5)
ALT: 58 U/L — ABNORMAL HIGH (ref 9–46)
AST: 37 U/L — ABNORMAL HIGH (ref 10–35)
Albumin: 5 g/dL (ref 3.6–5.1)
Alkaline phosphatase (APISO): 71 U/L (ref 35–144)
Bilirubin, Direct: 0.2 mg/dL (ref 0.0–0.2)
Globulin: 2 g/dL (calc) (ref 1.9–3.7)
Indirect Bilirubin: 0.7 mg/dL (calc) (ref 0.2–1.2)
Total Bilirubin: 0.9 mg/dL (ref 0.2–1.2)
Total Protein: 7 g/dL (ref 6.1–8.1)

## 2020-04-26 LAB — BASIC METABOLIC PANEL WITH GFR
BUN: 15 mg/dL (ref 7–25)
CO2: 29 mmol/L (ref 20–32)
Calcium: 9.7 mg/dL (ref 8.6–10.3)
Chloride: 103 mmol/L (ref 98–110)
Creat: 0.97 mg/dL (ref 0.70–1.25)
GFR, Est African American: 98 mL/min/{1.73_m2} (ref >=60)
GFR, Est Non African American: 84 mL/min/{1.73_m2} (ref >=60)
Glucose, Bld: 83 mg/dL (ref 65–99)
Potassium: 3.9 mmol/L (ref 3.5–5.3)
Sodium: 142 mmol/L (ref 135–146)

## 2020-04-26 LAB — PSA: PSA: 5.3 ng/mL — ABNORMAL HIGH (ref <=4.0)

## 2020-04-26 LAB — VITAMIN D 25 HYDROXY (VIT D DEFICIENCY, FRACTURES): Vit D, 25-Hydroxy: 23 ng/mL — ABNORMAL LOW (ref 30–100)

## 2020-04-26 LAB — TSH: TSH: 1.16 mIU/L (ref 0.40–4.50)

## 2020-04-26 LAB — LIPID PANEL
Cholesterol: 149 mg/dL (ref <200)
HDL: 43 mg/dL (ref >=40)
LDL Cholesterol (Calc): 73 mg/dL (calc)
Non-HDL Cholesterol (Calc): 106 mg/dL (calc) (ref <130)
Total CHOL/HDL Ratio: 3.5 (calc) (ref <5.0)
Triglycerides: 251 mg/dL — ABNORMAL HIGH (ref <150)

## 2020-04-26 LAB — PROTIME-INR
INR: 1
Prothrombin Time: 10.7 s (ref 9.0–11.5)

## 2020-05-12 NOTE — Addendum Note (Signed)
Addended by: Hinda Kehr on: 05/12/2020 04:41 PM   Modules accepted: Orders

## 2020-07-25 ENCOUNTER — Encounter: Payer: Self-pay | Admitting: Internal Medicine

## 2020-08-04 ENCOUNTER — Other Ambulatory Visit: Payer: Self-pay

## 2020-08-04 ENCOUNTER — Other Ambulatory Visit (INDEPENDENT_AMBULATORY_CARE_PROVIDER_SITE_OTHER): Payer: 59

## 2020-08-04 DIAGNOSIS — R972 Elevated prostate specific antigen [PSA]: Secondary | ICD-10-CM

## 2020-08-04 LAB — PSA: PSA: 4.77 ng/mL — ABNORMAL HIGH (ref 0.10–4.00)

## 2020-10-10 ENCOUNTER — Other Ambulatory Visit: Payer: Self-pay | Admitting: Internal Medicine

## 2020-10-10 DIAGNOSIS — K7581 Nonalcoholic steatohepatitis (NASH): Secondary | ICD-10-CM

## 2020-10-12 ENCOUNTER — Other Ambulatory Visit: Payer: Self-pay | Admitting: Internal Medicine

## 2020-10-12 DIAGNOSIS — E785 Hyperlipidemia, unspecified: Secondary | ICD-10-CM

## 2021-01-10 ENCOUNTER — Encounter: Payer: Self-pay | Admitting: Internal Medicine

## 2021-01-11 ENCOUNTER — Other Ambulatory Visit: Payer: Self-pay | Admitting: Internal Medicine

## 2021-01-11 DIAGNOSIS — R972 Elevated prostate specific antigen [PSA]: Secondary | ICD-10-CM

## 2021-01-11 NOTE — Telephone Encounter (Signed)
Patient called and was wondering if lab orders for PSA could be placed. He can be reached at [phone]. Please advise

## 2021-01-12 ENCOUNTER — Other Ambulatory Visit: Payer: Self-pay

## 2021-01-12 ENCOUNTER — Other Ambulatory Visit: Payer: 59

## 2021-01-12 DIAGNOSIS — R972 Elevated prostate specific antigen [PSA]: Secondary | ICD-10-CM

## 2021-01-12 LAB — PSA: PSA: 4.4

## 2021-01-13 LAB — PSA, TOTAL AND FREE
PSA, % Free: 20 % (calc) — ABNORMAL LOW (ref >25)
PSA, Free: 0.9 ng/mL
PSA, Total: 4.4 ng/mL — ABNORMAL HIGH (ref <=4.0)

## 2021-01-17 ENCOUNTER — Other Ambulatory Visit: Payer: Self-pay

## 2021-01-18 ENCOUNTER — Ambulatory Visit (INDEPENDENT_AMBULATORY_CARE_PROVIDER_SITE_OTHER): Payer: 59 | Admitting: Internal Medicine

## 2021-01-18 ENCOUNTER — Encounter: Payer: Self-pay | Admitting: Internal Medicine

## 2021-01-18 VITALS — BP 136/86 | HR 90 | Temp 98.2°F | Resp 16 | Ht 75.0 in | Wt 200.0 lb

## 2021-01-18 DIAGNOSIS — E785 Hyperlipidemia, unspecified: Secondary | ICD-10-CM

## 2021-01-18 DIAGNOSIS — K7581 Nonalcoholic steatohepatitis (NASH): Secondary | ICD-10-CM | POA: Diagnosis not present

## 2021-01-18 DIAGNOSIS — E781 Pure hyperglyceridemia: Secondary | ICD-10-CM

## 2021-01-18 LAB — HEPATIC FUNCTION PANEL
ALT: 36 U/L (ref 0–53)
AST: 26 U/L (ref 0–37)
Albumin: 4.6 g/dL (ref 3.5–5.2)
Alkaline Phosphatase: 72 U/L (ref 39–117)
Bilirubin, Direct: 0.1 mg/dL (ref 0.0–0.3)
Total Bilirubin: 0.7 mg/dL (ref 0.2–1.2)
Total Protein: 7 g/dL (ref 6.0–8.3)

## 2021-01-18 LAB — CBC WITH DIFFERENTIAL/PLATELET
Basophils Absolute: 0 10*3/uL (ref 0.0–0.1)
Basophils Relative: 0.5 % (ref 0.0–3.0)
Eosinophils Absolute: 0.1 10*3/uL (ref 0.0–0.7)
Eosinophils Relative: 2.1 % (ref 0.0–5.0)
HCT: 44.8 % (ref 39.0–52.0)
Hemoglobin: 15.6 g/dL (ref 13.0–17.0)
Lymphocytes Relative: 29.6 % (ref 12.0–46.0)
Lymphs Abs: 1.2 10*3/uL (ref 0.7–4.0)
MCHC: 34.9 g/dL (ref 30.0–36.0)
MCV: 90.4 fl (ref 78.0–100.0)
Monocytes Absolute: 0.4 10*3/uL (ref 0.1–1.0)
Monocytes Relative: 8.9 % (ref 3.0–12.0)
Neutro Abs: 2.4 10*3/uL (ref 1.4–7.7)
Neutrophils Relative %: 58.9 % (ref 43.0–77.0)
Platelets: 215 10*3/uL (ref 150.0–400.0)
RBC: 4.96 Mil/uL (ref 4.22–5.81)
RDW: 13.3 % (ref 11.5–15.5)
WBC: 4.2 10*3/uL (ref 4.0–10.5)

## 2021-01-18 LAB — CK: Total CK: 154 U/L (ref 7–232)

## 2021-01-18 LAB — BASIC METABOLIC PANEL WITH GFR
BUN: 13 mg/dL (ref 6–23)
CO2: 31 meq/L (ref 19–32)
Calcium: 9.9 mg/dL (ref 8.4–10.5)
Chloride: 103 meq/L (ref 96–112)
Creatinine, Ser: 0.83 mg/dL (ref 0.40–1.50)
GFR: 94.69 mL/min
Glucose, Bld: 96 mg/dL (ref 70–99)
Potassium: 4.1 meq/L (ref 3.5–5.1)
Sodium: 141 meq/L (ref 135–145)

## 2021-01-18 LAB — LIPID PANEL
Cholesterol: 137 mg/dL (ref 0–200)
HDL: 37.6 mg/dL — ABNORMAL LOW (ref >39.00)
NonHDL: 99.42
Total CHOL/HDL Ratio: 4
Triglycerides: 307 mg/dL — ABNORMAL HIGH (ref 0.0–149.0)
VLDL: 61.4 mg/dL — ABNORMAL HIGH (ref 0.0–40.0)

## 2021-01-18 LAB — LDL CHOLESTEROL, DIRECT: Direct LDL: 65 mg/dL

## 2021-01-18 MED ORDER — ROSUVASTATIN CALCIUM 5 MG PO TABS: 5.0000 mg | ORAL_TABLET | Freq: Every day | ORAL | 1 refills | Status: DC

## 2021-01-18 MED ORDER — PIOGLITAZONE HCL 15 MG PO TABS: 15.0000 mg | ORAL_TABLET | Freq: Every day | ORAL | 1 refills | Status: DC

## 2021-01-18 NOTE — Progress Notes (Signed)
Subjective:  Patient ID: Andrew Wright, male    DOB: 25-Jun-1961  Age: 60 y.o. MRN: [id number]  CC: Hyperlipidemia  This visit occurred during the SARS-CoV-2 public health emergency.  Safety protocols were in place, including screening questions prior to the visit, additional usage of staff PPE, and extensive cleaning of exam room while observing appropriate contact time as indicated for disinfecting solutions.    HPI Andrew Wright presents for f/up -   He is very active and denies any recent episodes of chest pain, shortness of breath, palpitations, edema, or fatigue.  Outpatient Medications Prior to Visit  Medication Sig Dispense Refill  . latanoprost (XALATAN) 0.005 % ophthalmic solution instill 1 drop INTO AFFECTED EYE ONCE EVERY EVENING  0  . pioglitazone (ACTOS) 15 MG tablet TAKE 1 TABLET(15 MG) BY MOUTH DAILY 90 tablet 1  . rosuvastatin (CRESTOR) 5 MG tablet TAKE 1 TABLET(5 MG) BY MOUTH AT BEDTIME 90 tablet 1   No facility-administered medications prior to visit.    ROS Review of Systems  Constitutional: Negative.  Negative for appetite change, diaphoresis and fatigue.  HENT: Negative.   Eyes: Negative.   Respiratory: Negative for chest tightness and shortness of breath.   Cardiovascular: Negative for chest pain, palpitations and leg swelling.  Gastrointestinal: Negative for abdominal pain, diarrhea and nausea.  Endocrine: Negative.   Genitourinary: Negative.  Negative for difficulty urinating.  Musculoskeletal: Negative for arthralgias and myalgias.  Skin: Negative.   Neurological: Negative.  Negative for dizziness and weakness.  Hematological: Negative for adenopathy. Does not bruise/bleed easily.  Psychiatric/Behavioral: Negative.     Objective:  BP 136/86 (BP Location: Right Arm, Patient Position: Sitting, Cuff Size: Large)   Pulse 90   Temp 98.2 F (36.8 C) (Oral)   Resp 16   Ht 6' 3"  (1.905 m)   Wt 200 lb (90.7 kg)   SpO2 97%   BMI 25.00 kg/m   BP  Readings from Last 3 Encounters:  01/18/21 136/86  04/25/20 (!) 142/86  01/08/20 130/80    Wt Readings from Last 3 Encounters:  01/18/21 200 lb (90.7 kg)  04/25/20 202 lb (91.6 kg)  01/08/20 203 lb 3.2 oz (92.2 kg)    Physical Exam Vitals reviewed.  HENT:     Nose: Nose normal.     Mouth/Throat:     Mouth: Mucous membranes are moist.  Eyes:     General: No scleral icterus.    Conjunctiva/sclera: Conjunctivae normal.  Cardiovascular:     Rate and Rhythm: Normal rate and regular rhythm.     Heart sounds: No murmur heard.   Pulmonary:     Effort: Pulmonary effort is normal.     Breath sounds: No stridor. No wheezing, rhonchi or rales.  Abdominal:     General: Abdomen is flat.     Palpations: There is no mass.     Tenderness: There is no abdominal tenderness. There is no guarding.  Musculoskeletal:        General: Normal range of motion.     Cervical back: Neck supple.     Right lower leg: No edema.     Left lower leg: No edema.  Lymphadenopathy:     Cervical: No cervical adenopathy.  Skin:    General: Skin is warm and dry.  Neurological:     General: No focal deficit present.     Mental Status: He is alert.  Psychiatric:        Mood and Affect: Mood normal.  Behavior: Behavior normal.     Lab Results  Component Value Date   WBC 4.2 01/18/2021   HGB 15.6 01/18/2021   HCT 44.8 01/18/2021   PLT 215.0 01/18/2021   GLUCOSE 96 01/18/2021   CHOL 137 01/18/2021   TRIG 307.0 (H) 01/18/2021   HDL 37.60 (L) 01/18/2021   LDLDIRECT 65.0 01/18/2021   LDLCALC 73 04/25/2020   ALT 36 01/18/2021   AST 26 01/18/2021   NA 141 01/18/2021   K 4.1 01/18/2021   CL 103 01/18/2021   CREATININE 0.83 01/18/2021   BUN 13 01/18/2021   CO2 31 01/18/2021   TSH 1.16 04/25/2020   PSA 4.4 01/12/2021   INR 1.0 04/25/2020    CT CARDIAC SCORING  Addendum Date: 01/05/2020   ADDENDUM REPORT: 01/05/2020 09:17 CLINICAL DATA:  Risk stratification EXAM: Coronary Calcium Score  TECHNIQUE: The patient was scanned on a [employer] scanner. Axial non-contrast 3 mm slices were carried out through the heart. The data set was analyzed on a dedicated work station and scored using the [place]. FINDINGS: Non-cardiac: See separate report from [facility]. Ascending Aorta: Normal caliber Pericardium: Normal Coronary arteries: Normal origins. IMPRESSION: Coronary calcium score of 0. Electronically Signed   By: Pixie Casino M.D.   On: 01/05/2020 09:17   Result Date: 01/05/2020 EXAM: OVER-READ INTERPRETATION  CT CHEST The following report is an over-read performed by radiologist Dr. Vinnie Langton of [facility], [place] on 01/05/2020. This over-read does not include interpretation of cardiac or coronary anatomy or pathology. The coronary calcium score/coronary CTA interpretation by the cardiologist is attached. COMPARISON:  None. FINDINGS: Within the visualized portions of the thorax there are no suspicious appearing pulmonary nodules or masses, there is no acute consolidative airspace disease, no pleural effusions, no pneumothorax and no lymphadenopathy. Visualized portions of the upper abdomen are unremarkable. There are no aggressive appearing lytic or blastic lesions noted in the visualized portions of the skeleton. IMPRESSION: No significant incidental noncardiac findings are noted. Electronically Signed: By: Vinnie Langton M.D. On: 01/05/2020 09:13    Assessment & Plan:   Andrew Wright was seen today for hyperlipidemia.  Diagnoses and all orders for this visit:  Nonalcoholic steatohepatitis (NASH)- His LFTs are normal now.  Will continue the current dose of pioglitazone. -     CBC with Differential/Platelet; Future -     Basic metabolic panel; Future -     Hepatic function panel; Future -     pioglitazone (ACTOS) 15 MG tablet; Take 1 tablet (15 mg total) by mouth daily. -     Hepatic function panel -     Basic metabolic panel -     CBC with  Differential/Platelet  Hypertriglyceridemia- His triglycerides are mildly elevated but not high enough to be treated with a medication. -     CBC with Differential/Platelet; Future -     Basic metabolic panel; Future -     Lipid panel; Future -     CK; Future -     CK -     Lipid panel -     Basic metabolic panel -     CBC with Differential/Platelet  Dyslipidemia, goal LDL below 130- He has achieved his LDL goal and is doing well on the statin. -     CBC with Differential/Platelet; Future -     Basic metabolic panel; Future -     Lipid panel; Future -     rosuvastatin (CRESTOR) 5 MG tablet; Take 1 tablet (5  mg total) by mouth daily. -     CK; Future -     CK -     Lipid panel -     Basic metabolic panel -     CBC with Differential/Platelet  Other orders -     LDL cholesterol, direct   I have changed Andrew Greenhouse "Perry"'s rosuvastatin and pioglitazone. I am also having him maintain his latanoprost.  Meds ordered this encounter  Medications  . rosuvastatin (CRESTOR) 5 MG tablet    Sig: Take 1 tablet (5 mg total) by mouth daily.    Dispense:  90 tablet    Refill:  1  . pioglitazone (ACTOS) 15 MG tablet    Sig: Take 1 tablet (15 mg total) by mouth daily.    Dispense:  90 tablet    Refill:  1     Follow-up: No follow-ups on file.  [name], MD

## 2021-07-05 ENCOUNTER — Encounter: Payer: Self-pay | Admitting: Gastroenterology

## 2021-07-27 DIAGNOSIS — R072 Precordial pain: Secondary | ICD-10-CM

## 2021-07-27 HISTORY — DX: Precordial pain: R07.2

## 2021-08-02 ENCOUNTER — Encounter: Payer: Self-pay | Admitting: Gastroenterology

## 2021-08-20 ENCOUNTER — Encounter (HOSPITAL_COMMUNITY): Payer: Self-pay

## 2021-08-20 ENCOUNTER — Other Ambulatory Visit: Payer: Self-pay

## 2021-08-20 ENCOUNTER — Emergency Department (HOSPITAL_COMMUNITY): Payer: 59

## 2021-08-20 ENCOUNTER — Emergency Department (HOSPITAL_COMMUNITY)
Admission: EM | Admit: 2021-08-20 | Discharge: 2021-08-20 | Disposition: A | Discharge status: Home / Self Care | Payer: 59 | Attending: Student | Admitting: Student

## 2021-08-20 DIAGNOSIS — R072 Precordial pain: Secondary | ICD-10-CM

## 2021-08-20 DIAGNOSIS — R079 Chest pain, unspecified: Secondary | ICD-10-CM

## 2021-08-20 LAB — BASIC METABOLIC PANEL
Anion gap: 8 (ref 5–15)
BUN: 9 mg/dL (ref 8–23)
CO2: 29 mmol/L (ref 22–32)
Calcium: 9.2 mg/dL (ref 8.9–10.3)
Chloride: 102 mmol/L (ref 98–111)
Creatinine, Ser: 0.78 mg/dL (ref 0.61–1.24)
GFR, Estimated: >60 mL/min (ref >60)
Glucose, Bld: 92 mg/dL (ref 70–99)
Potassium: 3.9 mmol/L (ref 3.5–5.1)
Sodium: 139 mmol/L (ref 135–145)

## 2021-08-20 LAB — CBC
HCT: 45.9 % (ref 39.0–52.0)
Hemoglobin: 15.8 g/dL (ref 13.0–17.0)
MCH: 32.1 pg (ref 26.0–34.0)
MCHC: 34.4 g/dL (ref 30.0–36.0)
MCV: 93.3 fL (ref 80.0–100.0)
Platelets: 235 10*3/uL (ref 150–400)
RBC: 4.92 MIL/uL (ref 4.22–5.81)
RDW: 12.4 % (ref 11.5–15.5)
WBC: 4.8 10*3/uL (ref 4.0–10.5)
nRBC: 0 % (ref 0.0–0.2)

## 2021-08-20 LAB — TROPONIN I (HIGH SENSITIVITY)
Troponin I (High Sensitivity): 3 ng/L (ref <18)
Troponin I (High Sensitivity): 3 ng/L (ref <18)

## 2021-08-20 NOTE — ED Triage Notes (Signed)
Pt c/o 2-3 sharp, seizing episodes of CP x1hr, advises similar experience x2 days. Denies radiation, N/V/D, states he feels "heat" in his head. Advises hx of similar episodes, told it could be stress.

## 2021-08-20 NOTE — ED Provider Notes (Signed)
[facility] EMERGENCY DEPARTMENT Provider Note   CSN: [id number] Arrival date & time: 08/20/21  1748     History Chief Complaint  Patient presents with   Chest Pain    Andrew Wright is a 60 y.o. male with PMH anxiety, depression, HLD who presents the emergency department for evaluation of chest pain.  Patient states that over the last 3 days he has had intermittent fleeting episodes of chest pain that shoots across his left chest, sharp.  He states that these episodes do not last more than 1 second and are not associated with shortness of breath, nausea, vomiting, diaphoresis.  There is no exertional component to the patient's chest pain.  Denies abdominal pain, cough, fever, diarrhea or other systemic symptoms.   Chest Pain Associated symptoms: no abdominal pain, no back pain, no cough, no fever, no palpitations, no shortness of breath and no vomiting       Past Medical History:  Diagnosis Date   Anxiety    Depression    Glaucoma    Hyperlipidemia    Hyperplastic colon polyp    Hypogonadism male 2010   Kidney stones    Migraine     Patient Active Problem List   Diagnosis Date Noted   Nonalcoholic steatohepatitis (NASH) 04/17/2019   Colon cancer screening 09/09/2017   Reactive arthritis ([place]) 08/01/2017   Other microscopic hematuria 08/01/2017   PSA elevation 01/30/2017   Mild episode of recurrent major depressive disorder ([place]) 01/30/2017   Hypertriglyceridemia 11/26/2014   Dyslipidemia, goal LDL below 130 12/24/2011   Routine general medical examination at a health care facility 11/05/2011   Hypogonadism male 11/05/2011    Past Surgical History:  Procedure Laterality Date   COLONOSCOPY  08/18/2009   [place]   PENECTOMY     for blockage in penis   WISDOM TOOTH EXTRACTION         Family History  Problem Relation Age of Onset   Arthritis Other    Stroke Other    Breast cancer Mother    Stomach cancer Mother     Prostate cancer Father    Skin cancer Sister    Leukemia Brother    Skin cancer Brother    Colon cancer Neg Hx    Rectal cancer Neg Hx    Esophageal cancer Neg Hx    Colon polyps Neg Hx     Social History   Tobacco Use   Smoking status: Never   Smokeless tobacco: Never  Vaping Use   Vaping Use: Never used  Substance Use Topics   Alcohol use: No   Drug use: No    Home Medications Prior to Admission medications   Medication Sig Start Date End Date Taking? Authorizing Provider  latanoprost (XALATAN) 0.005 % ophthalmic solution instill 1 drop INTO AFFECTED EYE ONCE EVERY EVENING 01/10/17   [provider]  pioglitazone (ACTOS) 15 MG tablet Take 1 tablet (15 mg total) by mouth daily. 01/18/21   Janith Lima, MD  rosuvastatin (CRESTOR) 5 MG tablet Take 1 tablet (5 mg total) by mouth daily. 01/18/21   Janith Lima, MD    Allergies    Lovaza [omega-3-acid ethyl esters], Asa [aspirin], Hydrocodone, and Penicillins  Review of Systems   Review of Systems  Constitutional:  Negative for chills and fever.  HENT:  Negative for ear pain and sore throat.   Eyes:  Negative for pain and visual disturbance.  Respiratory:  Negative for cough and shortness of  breath.   Cardiovascular:  Positive for chest pain. Negative for palpitations.  Gastrointestinal:  Negative for abdominal pain and vomiting.  Genitourinary:  Negative for dysuria and hematuria.  Musculoskeletal:  Negative for arthralgias and back pain.  Skin:  Negative for color change and rash.  Neurological:  Negative for seizures and syncope.  All other systems reviewed and are negative.  Physical Exam Updated Vital Signs BP 128/88 (BP Location: Right Arm)    Pulse 66    Temp 98.3 F (36.8 C) (Oral)    Resp 16    SpO2 99%   Physical Exam Vitals and nursing note reviewed.  Constitutional:      General: He is not in acute distress.    Appearance: He is well-developed.  HENT:     Head: Normocephalic and  atraumatic.  Eyes:     Conjunctiva/sclera: Conjunctivae normal.  Cardiovascular:     Rate and Rhythm: Normal rate and regular rhythm.     Heart sounds: No murmur heard. Pulmonary:     Effort: Pulmonary effort is normal. No respiratory distress.     Breath sounds: Normal breath sounds.  Abdominal:     Palpations: Abdomen is soft.     Tenderness: There is no abdominal tenderness.  Musculoskeletal:        General: No swelling.     Cervical back: Neck supple.  Skin:    General: Skin is warm and dry.     Capillary Refill: Capillary refill takes less than 2 seconds.  Neurological:     Mental Status: He is alert.  Psychiatric:        Mood and Affect: Mood normal.    ED Results / Procedures / Treatments   Labs (all labs ordered are listed, but only abnormal results are displayed) Labs Reviewed  BASIC METABOLIC PANEL  CBC  TROPONIN I (HIGH SENSITIVITY)  TROPONIN I (HIGH SENSITIVITY)    EKG None  Radiology DG Chest 2 View  Result Date: 08/20/2021 CLINICAL DATA:  Episodes of sharp seizing chest pain 3 times over past hour, similar experience for 2 days EXAM: CHEST - 2 VIEW COMPARISON:  05/24/2018 FINDINGS: Normal heart size, mediastinal contours, and pulmonary vascularity. Lungs clear. No pleural effusion or pneumothorax. Bones demineralized. IMPRESSION: No acute abnormalities. Electronically Signed   By: Lavonia Dana M.D.   On: 08/20/2021 18:28    Procedures Procedures   Medications Ordered in ED Medications - No data to display  ED Course  I have reviewed the triage vital signs and the nursing notes.  Pertinent labs & imaging results that were available during my care of the patient were reviewed by me and considered in my medical decision making (see chart for details).    MDM Rules/Calculators/A&P                          Patient seen emergency department for evaluation of chest pain.  Physical exam is unremarkable.  Laboratory evaluation including troponin and delta  troponin is unremarkable.  ECG with normal sinus rhythm, no elevations or depressions, normal intervals.  Chest x-ray unremarkable.  Patient presentation consistent with precordial catch syndrome, low suspicion for ACS with a heart score of 1.  Very low sufficient for PE as a patient currently does not have any chest pain and has no hypoxia or shortness of breath.  Patient then discharged with outpatient follow-up and given return precautions which she voiced understanding.   Final Clinical Impression(s) / ED Diagnoses  Final diagnoses:  Chest pain, unspecified type  Precordial catch syndrome    Rx / DC Orders ED Discharge Orders     None        Taylan Marez, Debe Coder, MD 08/20/21 [phone]

## 2021-09-27 ENCOUNTER — Ambulatory Visit (AMBULATORY_SURGERY_CENTER): Payer: 59 | Admitting: *Deleted

## 2021-09-27 ENCOUNTER — Other Ambulatory Visit: Payer: Self-pay

## 2021-09-27 VITALS — Ht 75.0 in | Wt 200.0 lb

## 2021-09-27 DIAGNOSIS — Z86010 Personal history of colonic polyps: Secondary | ICD-10-CM

## 2021-09-27 MED ORDER — NA SULFATE-K SULFATE-MG SULF 17.5-3.13-1.6 GM/177ML PO SOLN: 2.0000 | Freq: Once | ORAL | 0 refills | Status: AC

## 2021-09-27 NOTE — Progress Notes (Signed)

## 2021-10-03 ENCOUNTER — Other Ambulatory Visit: Payer: Self-pay | Admitting: Internal Medicine

## 2021-10-03 DIAGNOSIS — E785 Hyperlipidemia, unspecified: Secondary | ICD-10-CM

## 2021-10-10 ENCOUNTER — Encounter: Payer: Self-pay | Admitting: Gastroenterology

## 2021-10-11 ENCOUNTER — Encounter: Payer: Self-pay | Admitting: Gastroenterology

## 2021-10-11 ENCOUNTER — Other Ambulatory Visit: Payer: Self-pay

## 2021-10-11 ENCOUNTER — Ambulatory Visit (AMBULATORY_SURGERY_CENTER): Payer: 59 | Admitting: Gastroenterology

## 2021-10-11 VITALS — BP 112/70 | HR 68 | Temp 97.3°F | Resp 12 | Ht 75.0 in | Wt 200.0 lb

## 2021-10-11 DIAGNOSIS — Z86010 Personal history of colonic polyps: Secondary | ICD-10-CM | POA: Diagnosis not present

## 2021-10-11 DIAGNOSIS — D123 Benign neoplasm of transverse colon: Secondary | ICD-10-CM

## 2021-10-11 DIAGNOSIS — Z1211 Encounter for screening for malignant neoplasm of colon: Secondary | ICD-10-CM

## 2021-10-11 LAB — HM COLONOSCOPY

## 2021-10-11 MED ORDER — SODIUM CHLORIDE 0.9 % IV SOLN: 500.0000 mL | Freq: Once | INTRAVENOUS | Status: DC

## 2021-10-11 NOTE — Progress Notes (Signed)
[facility] History and Physical   Primary Care Physician:  Janith Lima, MD   Reason for Procedure:   History of colon polyps  Plan:    colonoscopy     HPI: Andrew Wright is a 61 y.o. male  here for colonoscopy surveillance - 9 polyps removed 10/19 - adenomas. Patient denies any bowel symptoms at this time. No family history of colon cancer known. Otherwise feels well without any cardiopulmonary symptoms.    Past Medical History:  Diagnosis Date   Anemia    Anxiety    Depression    Glaucoma    Hyperlipidemia    Hyperplastic colon polyp    Hypertension    Hypogonadism male 2010   Kidney stones    Migraine    Precordial catch syndrome 07/2021    Past Surgical History:  Procedure Laterality Date   COLONOSCOPY  08/18/2009   [place]   PENECTOMY     for blockage in penis   WISDOM TOOTH EXTRACTION      Prior to Admission medications   Medication Sig Start Date End Date Taking? Authorizing Provider  latanoprost (XALATAN) 0.005 % ophthalmic solution instill 1 drop INTO AFFECTED EYE ONCE EVERY EVENING 01/10/17  Yes [provider]  magnesium 30 MG tablet Take 30 mg by mouth 2 (two) times daily.   Yes [provider]  milk thistle 175 MG tablet Take 175 mg by mouth daily.   Yes [provider]  pioglitazone (ACTOS) 15 MG tablet Take 1 tablet (15 mg total) by mouth daily. 01/18/21  Yes Janith Lima, MD  rosuvastatin (CRESTOR) 5 MG tablet TAKE 1 TABLET(5 MG) BY MOUTH DAILY 10/03/21  Yes Janith Lima, MD  Ferrous Sulfate (BL IRON PO) Take by mouth.    [provider]    Current Outpatient Medications  Medication Sig Dispense Refill   latanoprost (XALATAN) 0.005 % ophthalmic solution instill 1 drop INTO AFFECTED EYE ONCE EVERY EVENING  0   magnesium 30 MG tablet Take 30 mg by mouth 2 (two) times daily.     milk thistle 175 MG tablet Take 175 mg by mouth daily.     pioglitazone (ACTOS) 15 MG tablet Take 1  tablet (15 mg total) by mouth daily. 90 tablet 1   rosuvastatin (CRESTOR) 5 MG tablet TAKE 1 TABLET(5 MG) BY MOUTH DAILY 90 tablet 0   Ferrous Sulfate (BL IRON PO) Take by mouth.     Current Facility-Administered Medications  Medication Dose Route Frequency Provider Last Rate Last Admin   0.9 %  sodium chloride infusion  500 mL Intravenous Once Aneita Kiger, [name], MD        Allergies as of 10/11/2021 - Review Complete 10/11/2021  Allergen Reaction Noted   Lovaza [omega-3-acid ethyl esters] Other (See Comments) 08/11/2015   Asa [aspirin] Other (See Comments) 03/06/2017   Hydrocodone Other (See Comments) 08/01/2017   Penicillins Nausea Only 09/18/2015    Family History  Problem Relation Age of Onset   Arthritis Other    Stroke Other    Breast cancer Mother    Stomach cancer Mother    Prostate cancer Father    Skin cancer Sister    Leukemia Brother    Skin cancer Brother    Colon cancer Neg Hx    Rectal cancer Neg Hx    Esophageal cancer Neg Hx    Colon polyps Neg Hx     Social History   Socioeconomic History   Marital status:  Married    Spouse name: Not on file   Number of children: Not on file   Years of education: Not on file   Highest education level: Not on file  Occupational History   Not on file  Tobacco Use   Smoking status: Never   Smokeless tobacco: Never  Vaping Use   Vaping Use: Never used  Substance and Sexual Activity   Alcohol use: No   Drug use: No   Sexual activity: Yes  Other Topics Concern   Not on file  Social History Narrative   Caffienated drinks-yes   Seat belt use often-yes   Regular Exercise-yes   Smoke alarm in the home-yes   Firearms/guns in the home-no   History of physical abuse-no                  Social Determinants of Health   Financial Resource Strain: Not on file  Food Insecurity: Not on file  Transportation Needs: Not on file  Physical Activity: Not on file  Stress: Not on file  Social Connections: Not on file   Intimate Partner Violence: Not on file    Review of Systems: All other review of systems negative except as mentioned in the HPI.  Physical Exam: Vital signs BP 114/79    Pulse 88    Temp (!) 97.3 F (36.3 C) (Temporal)    Resp 15    Ht 6' 3"  (1.905 m)    Wt 200 lb (90.7 kg)    SpO2 98%    BMI 25.00 kg/m   General:   Alert,  Well-developed, pleasant and cooperative in NAD Lungs:  Clear throughout to auscultation.   Heart:  Regular rate and rhythm Abdomen:  Soft, nontender and nondistended.   Neuro/Psych:  Alert and cooperative. Normal mood and affect. A and O x 3  Jolly Mango, MD [facility]

## 2021-10-11 NOTE — Op Note (Signed)
[facility] Patient Name: Andrew Wright Procedure Date: 10/11/2021 8:26 AM MRN: [id number] Endoscopist: Remo Lipps P. Havery Moros , MD Age: 61 Referring MD:  Date of Birth: [birth year] Gender: Male Account #: [id number] Procedure:                Colonoscopy Indications:              High risk colon cancer surveillance: Personal                            history of colonic polyps - last exam 05/2018 - 9                            polyps - adenomas Medicines:                Monitored Anesthesia Care Procedure:                Pre-Anesthesia Assessment:                           - Prior to the procedure, a History and Physical                            was performed, and patient medications and                            allergies were reviewed. The patient's tolerance of                            previous anesthesia was also reviewed. The risks                            and benefits of the procedure and the sedation                            options and risks were discussed with the patient.                            All questions were answered, and informed consent                            was obtained. Prior Anticoagulants: The patient has                            taken no previous anticoagulant or antiplatelet                            agents. ASA Grade Assessment: II - A patient with                            mild systemic disease. After reviewing the risks                            and benefits, the patient was deemed in  satisfactory condition to undergo the procedure.                           After obtaining informed consent, the colonoscope                            was passed under direct vision. Throughout the                            procedure, the patient's blood pressure, pulse, and                            oxygen saturations were monitored continuously. The                            Olympus CF-HQ190L ([id number]) Colonoscope  was                            introduced through the anus and advanced to the the                            cecum, identified by appendiceal orifice and                            ileocecal valve. The colonoscopy was performed                            without difficulty. The patient tolerated the                            procedure well. The quality of the bowel                            preparation was good. The ileocecal valve,                            appendiceal orifice, and rectum were photographed. Scope In: 8:31:53 AM Scope Out: 8:47:46 AM Scope Withdrawal Time: 0 hours 13 minutes 32 seconds  Total Procedure Duration: 0 hours 15 minutes 53 seconds  Findings:                 The perianal and digital rectal examinations were                            normal.                           Four flat and sessile polyps were found in the                            transverse colon. The polyps were 3 mm in size.                            These polyps were removed with a cold snare.  Resection and retrieval were complete.                           Internal hemorrhoids were found during retroflexion.                           The exam was otherwise without abnormality. Complications:            No immediate complications. Estimated blood loss:                            Minimal. Estimated Blood Loss:     Estimated blood loss was minimal. Impression:               - Four 3 mm polyps in the transverse colon, removed                            with a cold snare. Resected and retrieved.                           - Internal hemorrhoids.                           - The examination was otherwise normal. Recommendation:           - Patient has a contact number available for                            emergencies. The signs and symptoms of potential                            delayed complications were discussed with the                            patient. Return to  normal activities tomorrow.                            Written discharge instructions were provided to the                            patient.                           - Resume previous diet.                           - Continue present medications.                           - Await pathology results. Anticipate repeat                            colonoscopy in 5 years [name]. Rollin Kotowski, MD 10/11/2021 8:51:29 AM This report has been signed electronically.

## 2021-10-11 NOTE — Patient Instructions (Signed)
Please read handouts provided. Continue present medications. Await pathology results.   YOU HAD AN ENDOSCOPIC PROCEDURE TODAY AT [facility]:   Refer to the procedure report that was given to you for any specific questions about what was found during the examination.  If the procedure report does not answer your questions, please call your gastroenterologist to clarify.  If you requested that your care partner not be given the details of your procedure findings, then the procedure report has been included in a sealed envelope for you to review at your convenience later.  YOU SHOULD EXPECT: Some feelings of bloating in the abdomen. Passage of more gas than usual.  Walking can help get rid of the air that was put into your GI tract during the procedure and reduce the bloating. If you had a lower endoscopy (such as a colonoscopy or flexible sigmoidoscopy) you may notice spotting of blood in your stool or on the toilet paper. If you underwent a bowel prep for your procedure, you may not have a normal bowel movement for a few days.  Please Note:  You might notice some irritation and congestion in your nose or some drainage.  This is from the oxygen used during your procedure.  There is no need for concern and it should clear up in a day or so.  SYMPTOMS TO REPORT IMMEDIATELY:  Following lower endoscopy (colonoscopy or flexible sigmoidoscopy):  Excessive amounts of blood in the stool  Significant tenderness or worsening of abdominal pains  Swelling of the abdomen that is new, acute  Fever of 100F or higher   For urgent or emergent issues, a gastroenterologist can be reached at any hour by calling [phone]. Do not use MyChart messaging for urgent concerns.    DIET:  We do recommend a small meal at first, but then you may proceed to your regular diet.  Drink plenty of fluids but you should avoid alcoholic beverages for 24 hours.  ACTIVITY:  You should plan to take it easy  for the rest of today and you should NOT DRIVE or use heavy machinery until tomorrow (because of the sedation medicines used during the test).    FOLLOW UP: Our staff will call the number listed on your records 48-72 hours following your procedure to check on you and address any questions or concerns that you may have regarding the information given to you following your procedure. If we do not reach you, we will leave a message.  We will attempt to reach you two times.  During this call, we will ask if you have developed any symptoms of COVID 19. If you develop any symptoms (ie: fever, flu-like symptoms, shortness of breath, cough etc.) before then, please call [phone].  If you test positive for Covid 19 in the 2 weeks post procedure, please call and report this information to [place].    If any biopsies were taken you will be contacted by phone or by letter within the next 1-3 weeks.  Please call us at [phone] if you have not heard about the biopsies in 3 weeks.    SIGNATURES/CONFIDENTIALITY: You and/or your care partner have signed paperwork which will be entered into your electronic medical record.  These signatures attest to the fact that that the information above on your After Visit Summary has been reviewed and is understood.  Full responsibility of the confidentiality of this discharge information lies with you and/or your care-partner.

## 2021-10-11 NOTE — Progress Notes (Signed)
A and O x3. Report to RN. Tolerated MAC anesthesia well. 

## 2021-10-11 NOTE — Progress Notes (Signed)
Called to room to assist during endoscopic procedure.  Patient ID and intended procedure confirmed with present staff. Received instructions for my participation in the procedure from the performing physician.  

## 2021-10-11 NOTE — Progress Notes (Signed)
VS-CW  Pt's states no medical or surgical changes since previsit or office visit.

## 2021-10-13 ENCOUNTER — Telehealth: Payer: Self-pay

## 2021-10-13 NOTE — Telephone Encounter (Signed)
°  Follow up Call-  Call back number 10/11/2021  Post procedure Call Back phone  # [phone]  Permission to leave phone message Yes  Some recent data might be hidden     Patient questions:  Do you have a fever, pain , or abdominal swelling? No. Pain Score  0 *  Have you tolerated food without any problems? Yes.    Have you been able to return to your normal activities? Yes.    Do you have any questions about your discharge instructions: Diet   No. Medications  No. Follow up visit  No.  Do you have questions or concerns about your Care? No.  Actions: * If pain score is 4 or above: No action needed, pain <4.

## 2021-10-17 ENCOUNTER — Telehealth: Payer: Self-pay | Admitting: Internal Medicine

## 2021-10-17 ENCOUNTER — Other Ambulatory Visit: Payer: Self-pay

## 2021-10-17 ENCOUNTER — Encounter: Payer: Self-pay | Admitting: Internal Medicine

## 2021-10-17 ENCOUNTER — Ambulatory Visit (INDEPENDENT_AMBULATORY_CARE_PROVIDER_SITE_OTHER): Payer: 59 | Admitting: Internal Medicine

## 2021-10-17 VITALS — BP 122/76 | HR 86 | Temp 98.0°F | Ht 75.0 in | Wt 193.0 lb

## 2021-10-17 DIAGNOSIS — R972 Elevated prostate specific antigen [PSA]: Secondary | ICD-10-CM | POA: Diagnosis not present

## 2021-10-17 DIAGNOSIS — Z0000 Encounter for general adult medical examination without abnormal findings: Secondary | ICD-10-CM | POA: Diagnosis not present

## 2021-10-17 DIAGNOSIS — K7581 Nonalcoholic steatohepatitis (NASH): Secondary | ICD-10-CM | POA: Diagnosis not present

## 2021-10-17 DIAGNOSIS — Z23 Encounter for immunization: Secondary | ICD-10-CM

## 2021-10-17 DIAGNOSIS — E785 Hyperlipidemia, unspecified: Secondary | ICD-10-CM | POA: Diagnosis not present

## 2021-10-17 DIAGNOSIS — Z136 Encounter for screening for cardiovascular disorders: Secondary | ICD-10-CM

## 2021-10-17 DIAGNOSIS — E781 Pure hyperglyceridemia: Secondary | ICD-10-CM | POA: Diagnosis not present

## 2021-10-17 DIAGNOSIS — D239 Other benign neoplasm of skin, unspecified: Secondary | ICD-10-CM

## 2021-10-17 LAB — LIPID PANEL
Cholesterol: 145 mg/dL (ref 0–200)
HDL: 40.3 mg/dL (ref >39.00)
LDL Cholesterol: 67 mg/dL (ref 0–99)
NonHDL: 104.22
Total CHOL/HDL Ratio: 4
Triglycerides: 185 mg/dL — ABNORMAL HIGH (ref 0.0–149.0)
VLDL: 37 mg/dL (ref 0.0–40.0)

## 2021-10-17 LAB — PSA: PSA: 5.83 ng/mL — ABNORMAL HIGH (ref 0.10–4.00)

## 2021-10-17 NOTE — Patient Instructions (Signed)

## 2021-10-17 NOTE — Progress Notes (Signed)
Subjective:  Patient ID: Andrew Wright, male    DOB: [birth year]  Age: 61 y.o. MRN: [id number]  CC: Annual Exam and Hyperlipidemia  This visit occurred during the SARS-CoV-2 public health emergency.  Safety protocols were in place, including screening questions prior to the visit, additional usage of staff PPE, and extensive cleaning of exam room while observing appropriate contact time as indicated for disinfecting solutions.    HPI Andrew Wright presents for a CPX and f/up -   He feels well.  Offers no complaints.  Outpatient Medications Prior to Visit  Medication Sig Dispense Refill   Ferrous Sulfate (BL IRON PO) Take by mouth.     latanoprost (XALATAN) 0.005 % ophthalmic solution instill 1 drop INTO AFFECTED EYE ONCE EVERY EVENING  0   magnesium 30 MG tablet Take 30 mg by mouth 2 (two) times daily.     milk thistle 175 MG tablet Take 175 mg by mouth daily.     pioglitazone (ACTOS) 15 MG tablet Take 1 tablet (15 mg total) by mouth daily. 90 tablet 1   rosuvastatin (CRESTOR) 5 MG tablet TAKE 1 TABLET(5 MG) BY MOUTH DAILY 90 tablet 0   No facility-administered medications prior to visit.    ROS Review of Systems  Constitutional: Negative.  Negative for diaphoresis and fatigue.  HENT: Negative.    Eyes: Negative.   Respiratory:  Negative for cough, chest tightness, shortness of breath and wheezing.   Cardiovascular:  Negative for chest pain, palpitations and leg swelling.  Gastrointestinal:  Negative for abdominal pain, constipation, diarrhea, nausea and vomiting.  Endocrine: Negative.   Genitourinary: Negative.  Negative for difficulty urinating and dysuria.  Musculoskeletal: Negative.  Negative for arthralgias and myalgias.  Skin: Negative.   Neurological:  Negative for dizziness, weakness, light-headedness and headaches.  Hematological:  Negative for adenopathy. Does not bruise/bleed easily.  Psychiatric/Behavioral: Negative.     Objective:  BP 122/76 (BP Location:  Left Arm, Patient Position: Sitting, Cuff Size: Large)    Pulse 86    Temp 98 F (36.7 C) (Oral)    Ht 6' 3"  (1.905 m)    Wt 193 lb (87.5 kg)    SpO2 96%    BMI 24.12 kg/m   BP Readings from Last 3 Encounters:  10/17/21 122/76  10/11/21 112/70  08/20/21 128/88    Wt Readings from Last 3 Encounters:  10/17/21 193 lb (87.5 kg)  10/11/21 200 lb (90.7 kg)  09/27/21 200 lb (90.7 kg)    Physical Exam Vitals reviewed.  HENT:     Nose: Nose normal.     Mouth/Throat:     Mouth: Mucous membranes are moist.  Eyes:     General: No scleral icterus.    Conjunctiva/sclera: Conjunctivae normal.  Cardiovascular:     Rate and Rhythm: Normal rate and regular rhythm.     Heart sounds: No murmur heard. Pulmonary:     Effort: Pulmonary effort is normal.     Breath sounds: No stridor. No wheezing, rhonchi or rales.  Abdominal:     Palpations: There is no mass.     Tenderness: There is no abdominal tenderness. There is no guarding.     Hernia: No hernia is present.  Musculoskeletal:        General: Normal range of motion.     Cervical back: Neck supple.     Left lower leg: No edema.  Lymphadenopathy:     Cervical: No cervical adenopathy.  Skin:    General: Skin  is warm and dry.  Neurological:     General: No focal deficit present.     Mental Status: He is alert.  Psychiatric:        Mood and Affect: Mood normal.        Behavior: Behavior normal.    Lab Results  Component Value Date   WBC 4.8 08/20/2021   HGB 15.8 08/20/2021   HCT 45.9 08/20/2021   PLT 235 08/20/2021   GLUCOSE 92 08/20/2021   CHOL 145 10/17/2021   TRIG 185.0 (H) 10/17/2021   HDL 40.30 10/17/2021   LDLDIRECT 65.0 01/18/2021   LDLCALC 67 10/17/2021   ALT 36 01/18/2021   AST 26 01/18/2021   NA 139 08/20/2021   K 3.9 08/20/2021   CL 102 08/20/2021   CREATININE 0.78 08/20/2021   BUN 9 08/20/2021   CO2 29 08/20/2021   TSH 1.16 04/25/2020   PSA 5.83 (H) 10/17/2021   INR 1.0 04/25/2020    DG Chest 2  View  Result Date: 08/20/2021 CLINICAL DATA:  Episodes of sharp seizing chest pain 3 times over past hour, similar experience for 2 days EXAM: CHEST - 2 VIEW COMPARISON:  05/24/2018 FINDINGS: Normal heart size, mediastinal contours, and pulmonary vascularity. Lungs clear. No pleural effusion or pneumothorax. Bones demineralized. IMPRESSION: No acute abnormalities. Electronically Signed   By: Lavonia Dana M.D.   On: 08/20/2021 18:28    Assessment & Plan:   Andrew Wright was seen today for annual exam and hyperlipidemia.  Diagnoses and all orders for this visit:  PSA elevation- His PSA has risen slightly.  We will recheck in 3 months.  Nonalcoholic steatohepatitis (NASH)- His liver enzymes have improved.  Dyslipidemia, goal LDL below 130- LDL goal achieved. Doing well on the statin   Hypertriglyceridemia- His triglycerides have improved.  Routine general medical examination at a health care facility- Exam completed, labs reviewed, vaccines reviewed and updated, cancer screenings are up-to-date, patient education was given. -     Lipid panel; Future -     PSA; Future -     PSA -     Lipid panel  Dysplastic nevi -     Cancel: Ambulatory referral to Dermatology -     Ambulatory referral to Dermatology  Other orders -     Tdap vaccine greater than or equal to 7yo IM   I am having Andrew Wright "Andrew Wright" maintain his latanoprost, pioglitazone, Ferrous Sulfate (BL IRON PO), milk thistle, magnesium, and rosuvastatin.  No orders of the defined types were placed in this encounter.    Follow-up: Return in about 6 months (around 04/16/2022).  Andrew Calico, MD

## 2021-10-17 NOTE — Telephone Encounter (Signed)
Pt states he "left the visit today and forgot to get the named and information of the dermatologist Dr. Ronnald Ramp recommended"  Pt requesting a c/b

## 2021-10-18 NOTE — Telephone Encounter (Signed)
Called pt, LVM with contact info below from PCP for derm specalist

## 2022-01-08 ENCOUNTER — Other Ambulatory Visit: Payer: Self-pay | Admitting: Internal Medicine

## 2022-01-08 DIAGNOSIS — E785 Hyperlipidemia, unspecified: Secondary | ICD-10-CM

## 2022-01-18 ENCOUNTER — Encounter: Payer: Self-pay | Admitting: Internal Medicine

## 2022-01-18 DIAGNOSIS — R972 Elevated prostate specific antigen [PSA]: Secondary | ICD-10-CM

## 2022-01-19 ENCOUNTER — Other Ambulatory Visit: Payer: Self-pay | Admitting: Internal Medicine

## 2022-01-19 DIAGNOSIS — R972 Elevated prostate specific antigen [PSA]: Secondary | ICD-10-CM

## 2022-01-23 ENCOUNTER — Other Ambulatory Visit (INDEPENDENT_AMBULATORY_CARE_PROVIDER_SITE_OTHER): Payer: 59

## 2022-01-23 DIAGNOSIS — R972 Elevated prostate specific antigen [PSA]: Secondary | ICD-10-CM

## 2022-01-23 LAB — PSA: PSA: 6.03 ng/mL — ABNORMAL HIGH (ref 0.10–4.00)

## 2022-07-09 ENCOUNTER — Other Ambulatory Visit: Payer: Self-pay | Admitting: Internal Medicine

## 2022-07-09 DIAGNOSIS — E785 Hyperlipidemia, unspecified: Secondary | ICD-10-CM

## 2022-08-10 HISTORY — PX: COLONOSCOPY WITH PROPOFOL: SHX5780

## 2022-10-04 ENCOUNTER — Telehealth: Payer: Self-pay | Admitting: Internal Medicine

## 2022-10-04 NOTE — Telephone Encounter (Signed)
Patient called and scheduled a physical for 11/14/22 at 1:00 pm. He would like to have blood work done prior to the appointment, if possible. He would like a call back letting him know if he can. Best callback number is [phone].

## 2022-10-07 ENCOUNTER — Other Ambulatory Visit: Payer: Self-pay | Admitting: Internal Medicine

## 2022-10-07 DIAGNOSIS — R972 Elevated prostate specific antigen [PSA]: Secondary | ICD-10-CM

## 2022-10-07 DIAGNOSIS — E785 Hyperlipidemia, unspecified: Secondary | ICD-10-CM

## 2022-10-07 DIAGNOSIS — E781 Pure hyperglyceridemia: Secondary | ICD-10-CM

## 2022-10-07 DIAGNOSIS — R3129 Other microscopic hematuria: Secondary | ICD-10-CM

## 2022-10-07 DIAGNOSIS — K7581 Nonalcoholic steatohepatitis (NASH): Secondary | ICD-10-CM

## 2022-10-07 NOTE — Progress Notes (Unsigned)
Lab Results  Component Value Date   WBC 4.8 08/20/2021   HGB 15.8 08/20/2021   HCT 45.9 08/20/2021   PLT 235 08/20/2021   GLUCOSE 92 08/20/2021   CHOL 145 10/17/2021   TRIG 185.0 (H) 10/17/2021   HDL 40.30 10/17/2021   LDLDIRECT 65.0 01/18/2021   LDLCALC 67 10/17/2021   ALT 36 01/18/2021   AST 26 01/18/2021   NA 139 08/20/2021   K 3.9 08/20/2021   CL 102 08/20/2021   CREATININE 0.78 08/20/2021   BUN 9 08/20/2021   CO2 29 08/20/2021   TSH 1.16 04/25/2020   PSA 6.03 (H) 01/23/2022   INR 1.0 04/25/2020

## 2022-10-08 NOTE — Telephone Encounter (Signed)
Pt has been informed that lab ordered have been entered.

## 2022-11-12 ENCOUNTER — Other Ambulatory Visit (INDEPENDENT_AMBULATORY_CARE_PROVIDER_SITE_OTHER): Payer: 59

## 2022-11-12 DIAGNOSIS — K7581 Nonalcoholic steatohepatitis (NASH): Secondary | ICD-10-CM

## 2022-11-12 DIAGNOSIS — R3129 Other microscopic hematuria: Secondary | ICD-10-CM | POA: Diagnosis not present

## 2022-11-12 DIAGNOSIS — E781 Pure hyperglyceridemia: Secondary | ICD-10-CM | POA: Diagnosis not present

## 2022-11-12 DIAGNOSIS — R972 Elevated prostate specific antigen [PSA]: Secondary | ICD-10-CM | POA: Diagnosis not present

## 2022-11-12 DIAGNOSIS — E785 Hyperlipidemia, unspecified: Secondary | ICD-10-CM

## 2022-11-12 LAB — CBC WITH DIFFERENTIAL/PLATELET
Basophils Absolute: 0 10*3/uL (ref 0.0–0.1)
Basophils Relative: 0.5 % (ref 0.0–3.0)
Eosinophils Absolute: 0.2 10*3/uL (ref 0.0–0.7)
Eosinophils Relative: 3.6 % (ref 0.0–5.0)
HCT: 44.4 % (ref 39.0–52.0)
Hemoglobin: 15.3 g/dL (ref 13.0–17.0)
Lymphocytes Relative: 29.7 % (ref 12.0–46.0)
Lymphs Abs: 1.4 10*3/uL (ref 0.7–4.0)
MCHC: 34.5 g/dL (ref 30.0–36.0)
MCV: 91.6 fl (ref 78.0–100.0)
Monocytes Absolute: 0.4 10*3/uL (ref 0.1–1.0)
Monocytes Relative: 8.4 % (ref 3.0–12.0)
Neutro Abs: 2.7 10*3/uL (ref 1.4–7.7)
Neutrophils Relative %: 57.8 % (ref 43.0–77.0)
Platelets: 202 10*3/uL (ref 150.0–400.0)
RBC: 4.85 Mil/uL (ref 4.22–5.81)
RDW: 13.6 % (ref 11.5–15.5)
WBC: 4.6 10*3/uL (ref 4.0–10.5)

## 2022-11-12 LAB — HEPATIC FUNCTION PANEL
ALT: 38 U/L (ref 0–53)
AST: 25 U/L (ref 0–37)
Albumin: 4.2 g/dL (ref 3.5–5.2)
Alkaline Phosphatase: 111 U/L (ref 39–117)
Bilirubin, Direct: 0.1 mg/dL (ref 0.0–0.3)
Total Bilirubin: 0.6 mg/dL (ref 0.2–1.2)
Total Protein: 6.3 g/dL (ref 6.0–8.3)

## 2022-11-12 LAB — URINALYSIS, ROUTINE W REFLEX MICROSCOPIC
Bilirubin Urine: NEGATIVE
Hgb urine dipstick: NEGATIVE
Ketones, ur: NEGATIVE
Leukocytes,Ua: NEGATIVE
Nitrite: NEGATIVE
Specific Gravity, Urine: 1.015 (ref 1.000–1.030)
Total Protein, Urine: NEGATIVE
Urine Glucose: NEGATIVE
Urobilinogen, UA: 0.2 (ref 0.0–1.0)
pH: 7.5 (ref 5.0–8.0)

## 2022-11-12 LAB — BASIC METABOLIC PANEL
BUN: 10 mg/dL (ref 6–23)
CO2: 30 mEq/L (ref 19–32)
Calcium: 9.1 mg/dL (ref 8.4–10.5)
Chloride: 104 mEq/L (ref 96–112)
Creatinine, Ser: 0.84 mg/dL (ref 0.40–1.50)
GFR: 93.16 mL/min (ref >60.00)
Glucose, Bld: 93 mg/dL (ref 70–99)
Potassium: 4.5 mEq/L (ref 3.5–5.1)
Sodium: 143 mEq/L (ref 135–145)

## 2022-11-12 LAB — LIPID PANEL
Cholesterol: 169 mg/dL (ref 0–200)
HDL: 48.6 mg/dL (ref >39.00)
NonHDL: 120.88
Total CHOL/HDL Ratio: 3
Triglycerides: 284 mg/dL — ABNORMAL HIGH (ref 0.0–149.0)
VLDL: 56.8 mg/dL — ABNORMAL HIGH (ref 0.0–40.0)

## 2022-11-12 LAB — LDL CHOLESTEROL, DIRECT: Direct LDL: 82 mg/dL

## 2022-11-12 LAB — PSA: PSA: 7.13 ng/mL — ABNORMAL HIGH (ref 0.10–4.00)

## 2022-11-14 ENCOUNTER — Ambulatory Visit (INDEPENDENT_AMBULATORY_CARE_PROVIDER_SITE_OTHER): Payer: 59 | Admitting: Internal Medicine

## 2022-11-14 ENCOUNTER — Encounter: Payer: Self-pay | Admitting: Internal Medicine

## 2022-11-14 VITALS — BP 132/82 | HR 89 | Temp 98.3°F | Resp 16 | Ht 75.0 in | Wt 195.0 lb

## 2022-11-14 DIAGNOSIS — E785 Hyperlipidemia, unspecified: Secondary | ICD-10-CM

## 2022-11-14 DIAGNOSIS — E781 Pure hyperglyceridemia: Secondary | ICD-10-CM | POA: Diagnosis not present

## 2022-11-14 DIAGNOSIS — R972 Elevated prostate specific antigen [PSA]: Secondary | ICD-10-CM | POA: Diagnosis not present

## 2022-11-14 DIAGNOSIS — Z0000 Encounter for general adult medical examination without abnormal findings: Secondary | ICD-10-CM

## 2022-11-14 DIAGNOSIS — K7581 Nonalcoholic steatohepatitis (NASH): Secondary | ICD-10-CM | POA: Diagnosis not present

## 2022-11-14 NOTE — Progress Notes (Signed)
Subjective:  Patient ID: Andrew Wright, male    DOB: 1960-10-19  Age: 62 y.o. MRN: [id number]  CC: Hyperlipidemia and Annual Exam   HPI [name] presents for a CPX and f/up ------  He is active and denies chest pain, shortness of breath, diaphoresis, or edema.  Outpatient Medications Prior to Visit  Medication Sig Dispense Refill   Ferrous Sulfate (BL IRON PO) Take by mouth.     latanoprost (XALATAN) 0.005 % ophthalmic solution instill 1 drop INTO AFFECTED EYE ONCE EVERY EVENING  0   magnesium 30 MG tablet Take 30 mg by mouth 2 (two) times daily.     milk thistle 175 MG tablet Take 175 mg by mouth daily.     rosuvastatin (CRESTOR) 5 MG tablet TAKE 1 TABLET(5 MG) BY MOUTH DAILY 90 tablet 1   pioglitazone (ACTOS) 15 MG tablet Take 1 tablet (15 mg total) by mouth daily. 90 tablet 1   No facility-administered medications prior to visit.    ROS Review of Systems  Constitutional: Negative.  Negative for diaphoresis and fatigue.  HENT: Negative.    Eyes: Negative.   Respiratory:  Negative for cough, chest tightness, shortness of breath and wheezing.   Cardiovascular:  Negative for chest pain, palpitations and leg swelling.  Gastrointestinal:  Negative for abdominal pain, constipation, diarrhea, nausea and vomiting.  Endocrine: Negative.   Genitourinary: Negative.  Negative for difficulty urinating and dysuria.  Musculoskeletal: Negative.  Negative for arthralgias and myalgias.  Skin: Negative.   Allergic/Immunologic: Negative.   Neurological: Negative.  Negative for dizziness and weakness.  Hematological:  Negative for adenopathy. Does not bruise/bleed easily.  Psychiatric/Behavioral: Negative.      Objective:  BP 132/82 (BP Location: Left Arm, Patient Position: Sitting, Cuff Size: Large)   Pulse 89   Temp 98.3 F (36.8 C) (Oral)   Resp 16   Ht 6\' 3"  (1.905 m)   Wt 195 lb (88.5 kg)   SpO2 93%   BMI 24.37 kg/m   BP Readings from Last 3 Encounters:  11/14/22  132/82  10/17/21 122/76  10/11/21 112/70    Wt Readings from Last 3 Encounters:  11/14/22 195 lb (88.5 kg)  10/17/21 193 lb (87.5 kg)  10/11/21 200 lb (90.7 kg)    Physical Exam Vitals reviewed.  HENT:     Nose: Nose normal.     Mouth/Throat:     Mouth: Mucous membranes are moist.  Eyes:     General: No scleral icterus.    Conjunctiva/sclera: Conjunctivae normal.  Cardiovascular:     Rate and Rhythm: Normal rate and regular rhythm.     Heart sounds: No murmur heard.    No friction rub. No gallop.  Pulmonary:     Effort: Pulmonary effort is normal.     Breath sounds: No stridor. No wheezing, rhonchi or rales.  Abdominal:     General: Abdomen is flat.     Palpations: There is no mass.     Tenderness: There is no abdominal tenderness. There is no guarding.     Hernia: No hernia is present. There is no hernia in the left inguinal area or right inguinal area.  Genitourinary:    Pubic Area: No rash.      Penis: Normal and circumcised.      Testes: Normal.     Epididymis:     Right: Normal.     Left: Normal.     Prostate: Enlarged. Not tender and no nodules present.  Rectum: Normal. Guaiac result negative. No mass, tenderness, anal fissure, external hemorrhoid or internal hemorrhoid. Normal anal tone.  Musculoskeletal:        General: Normal range of motion.     Cervical back: Neck supple.     Right lower leg: No edema.     Left lower leg: No edema.  Lymphadenopathy:     Cervical: No cervical adenopathy.     Lower Body: No right inguinal adenopathy. No left inguinal adenopathy.  Skin:    General: Skin is warm and dry.     Coloration: Skin is not pale.     Findings: No rash.  Neurological:     General: No focal deficit present.     Mental Status: He is alert. Mental status is at baseline.  Psychiatric:        Mood and Affect: Mood normal.        Behavior: Behavior normal.     Lab Results  Component Value Date   WBC 4.6 11/12/2022   HGB 15.3 11/12/2022    HCT 44.4 11/12/2022   PLT 202.0 11/12/2022   GLUCOSE 93 11/12/2022   CHOL 169 11/12/2022   TRIG 284.0 (H) 11/12/2022   HDL 48.60 11/12/2022   LDLDIRECT 82.0 11/12/2022   LDLCALC 67 10/17/2021   ALT 38 11/12/2022   AST 25 11/12/2022   NA 143 11/12/2022   K 4.5 11/12/2022   CL 104 11/12/2022   CREATININE 0.84 11/12/2022   BUN 10 11/12/2022   CO2 30 11/12/2022   TSH 1.16 04/25/2020   PSA 7.13 (H) 11/12/2022   INR 1.0 04/25/2020    DG Chest 2 View  Result Date: 08/20/2021 CLINICAL DATA:  Episodes of sharp seizing chest pain 3 times over past hour, similar experience for 2 days EXAM: CHEST - 2 VIEW COMPARISON:  05/24/2018 FINDINGS: Normal heart size, mediastinal contours, and pulmonary vascularity. Lungs clear. No pleural effusion or pneumothorax. Bones demineralized. IMPRESSION: No acute abnormalities. Electronically Signed   By: Lavonia Dana M.D.   On: 08/20/2021 18:28    Assessment & Plan:   Dyslipidemia, goal LDL below 130 - LDL goal achieved. Doing well on the statin  -     Rosuvastatin Calcium; Take 1 tablet (5 mg total) by mouth daily.  Dispense: 90 tablet; Refill: 1  Hypertriglyceridemia- Improvement noted with lifestyle modifications.  Nonalcoholic steatohepatitis (NASH)- His liver enzymes have improved with lifestyle modifications.  Routine general medical examination at a health care facility- Exam completed, labs reviewed, vaccines reviewed and updated, cancer screenings addressed, patient education was given.  Rising PSA level -     Ambulatory referral to Urology     Follow-up: Return in about 6 months (around 05/17/2023).  [name], MD

## 2022-11-14 NOTE — Patient Instructions (Signed)
Health Maintenance, Male Adopting a healthy lifestyle and getting preventive care are important in promoting health and wellness. Ask your health care provider about: The right schedule for you to have regular tests and exams. Things you can do on your own to prevent diseases and keep yourself healthy. What should I know about diet, weight, and exercise? Eat a healthy diet  Eat a diet that includes plenty of vegetables, fruits, low-fat dairy products, and lean protein. Do not eat a lot of foods that are high in solid fats, added sugars, or sodium. Maintain a healthy weight Body mass index (BMI) is a measurement that can be used to identify possible weight problems. It estimates body fat based on height and weight. Your health care provider can help determine your BMI and help you achieve or maintain a healthy weight. Get regular exercise Get regular exercise. This is one of the most important things you can do for your health. Most adults should: Exercise for at least 150 minutes each week. The exercise should increase your heart rate and make you sweat (moderate-intensity exercise). Do strengthening exercises at least twice a week. This is in addition to the moderate-intensity exercise. Spend less time sitting. Even light physical activity can be beneficial. Watch cholesterol and blood lipids Have your blood tested for lipids and cholesterol at 63 years of age, then have this test every 5 years. You may need to have your cholesterol levels checked more often if: Your lipid or cholesterol levels are high. You are older than 63 years of age. You are at high risk for heart disease. What should I know about cancer screening? Many types of cancers can be detected early and may often be prevented. Depending on your health history and family history, you may need to have cancer screening at various ages. This may include screening for: Colorectal cancer. Prostate cancer. Skin cancer. Lung  cancer. What should I know about heart disease, diabetes, and high blood pressure? Blood pressure and heart disease High blood pressure causes heart disease and increases the risk of stroke. This is more likely to develop in people who have high blood pressure readings or are overweight. Talk with your health care provider about your target blood pressure readings. Have your blood pressure checked: Every 3-5 years if you are 18-39 years of age. Every year if you are 40 years old or older. If you are between the ages of 65 and 75 and are a current or former smoker, ask your health care provider if you should have a one-time screening for abdominal aortic aneurysm (AAA). Diabetes Have regular diabetes screenings. This checks your fasting blood sugar level. Have the screening done: Once every three years after age 45 if you are at a normal weight and have a low risk for diabetes. More often and at a younger age if you are overweight or have a high risk for diabetes. What should I know about preventing infection? Hepatitis B If you have a higher risk for hepatitis B, you should be screened for this virus. Talk with your health care provider to find out if you are at risk for hepatitis B infection. Hepatitis C Blood testing is recommended for: Everyone born from 1945 through 1965. Anyone with known risk factors for hepatitis C. Sexually transmitted infections (STIs) You should be screened each year for STIs, including gonorrhea and chlamydia, if: You are sexually active and are younger than 63 years of age. You are older than 63 years of age and your   health care provider tells you that you are at risk for this type of infection. Your sexual activity has changed since you were last screened, and you are at increased risk for chlamydia or gonorrhea. Ask your health care provider if you are at risk. Ask your health care provider about whether you are at high risk for HIV. Your health care provider  may recommend a prescription medicine to help prevent HIV infection. If you choose to take medicine to prevent HIV, you should first get tested for HIV. You should then be tested every 3 months for as long as you are taking the medicine. Follow these instructions at home: Alcohol use Do not drink alcohol if your health care provider tells you not to drink. If you drink alcohol: Limit how much you have to 0-2 drinks a day. Know how much alcohol is in your drink. In the [place] one drink equals one 12 oz bottle of beer (355 mL), one 5 oz glass of wine (148 mL), or one 1 oz glass of hard liquor (44 mL). Lifestyle Do not use any products that contain nicotine or tobacco. These products include cigarettes, chewing tobacco, and vaping devices, such as e-cigarettes. If you need help quitting, ask your health care provider. Do not use street drugs. Do not share needles. Ask your health care provider for help if you need support or information about quitting drugs. General instructions Schedule regular health, dental, and eye exams. Stay current with your vaccines. Tell your health care provider if: You often feel depressed. You have ever been abused or do not feel safe at home. Summary Adopting a healthy lifestyle and getting preventive care are important in promoting health and wellness. Follow your health care provider's instructions about healthy diet, exercising, and getting tested or screened for diseases. Follow your health care provider's instructions on monitoring your cholesterol and blood pressure. This information is not intended to replace advice given to you by your health care provider. Make sure you discuss any questions you have with your health care provider. Document Revised: 01/02/2021 Document Reviewed: 01/02/2021 Elsevier Patient Education  2023 Elsevier Inc.  

## 2022-11-15 MED ORDER — ROSUVASTATIN CALCIUM 5 MG PO TABS: 5.0000 mg | ORAL_TABLET | Freq: Every day | ORAL | 1 refills | Status: DC

## 2023-01-26 DIAGNOSIS — C61 Malignant neoplasm of prostate: Secondary | ICD-10-CM

## 2023-01-26 HISTORY — DX: Malignant neoplasm of prostate: C61

## 2023-03-01 ENCOUNTER — Telehealth: Payer: Self-pay | Admitting: Radiation Oncology

## 2023-03-01 NOTE — Telephone Encounter (Signed)
Left message for patient to call back to schedule consult per 7/3 referral.

## 2023-03-15 ENCOUNTER — Encounter: Payer: Self-pay | Admitting: Radiation Oncology

## 2023-03-15 NOTE — Progress Notes (Signed)
GU Location of Tumor / Histology: Prostate Ca  If Prostate Cancer, Gleason Score is (3 + 4) and PSA is (7.13 on 11/12/2022)  Biopsies      Past/Anticipated interventions by urology, if any: Next visit 04/09/2023 Dr. Sebastian Ache   Past/Anticipated interventions by medical oncology, if any: NA  Weight changes, if any: No  IPSS:  6 SHIM:  10  Bowel/Bladder complaints, if any:  No  Nausea/Vomiting, if any: No  Pain issues, if any:  0/10  SAFETY ISSUES: Prior radiation? No Pacemaker/ICD? No Possible current pregnancy? Male Is the patient on methotrexate? No  Current Complaints / other details:

## 2023-03-21 ENCOUNTER — Encounter: Payer: Self-pay | Admitting: Urology

## 2023-03-21 DIAGNOSIS — C61 Malignant neoplasm of prostate: Secondary | ICD-10-CM | POA: Insufficient documentation

## 2023-03-22 ENCOUNTER — Ambulatory Visit: Admission: RE | Admit: 2023-03-22 | Payer: 59 | Source: Ambulatory Visit | Admitting: Radiation Oncology

## 2023-03-22 ENCOUNTER — Encounter: Payer: Self-pay | Admitting: Radiation Oncology

## 2023-03-22 ENCOUNTER — Ambulatory Visit
Admission: RE | Admit: 2023-03-22 | Discharge: 2023-03-22 | Disposition: A | Discharge status: Home / Self Care | Payer: 59 | Source: Ambulatory Visit | Attending: Radiation Oncology | Admitting: Radiation Oncology

## 2023-03-22 VITALS — BP 142/84 | HR 86 | Temp 97.8°F | Resp 20 | Ht 75.0 in | Wt 198.8 lb

## 2023-03-22 DIAGNOSIS — Z87442 Personal history of urinary calculi: Secondary | ICD-10-CM | POA: Diagnosis not present

## 2023-03-22 DIAGNOSIS — Z86010 Personal history of colonic polyps: Secondary | ICD-10-CM | POA: Insufficient documentation

## 2023-03-22 DIAGNOSIS — Z800 Family history of malignant neoplasm of digestive organs: Secondary | ICD-10-CM | POA: Insufficient documentation

## 2023-03-22 DIAGNOSIS — Z803 Family history of malignant neoplasm of breast: Secondary | ICD-10-CM | POA: Diagnosis not present

## 2023-03-22 DIAGNOSIS — I10 Essential (primary) hypertension: Secondary | ICD-10-CM | POA: Insufficient documentation

## 2023-03-22 DIAGNOSIS — C61 Malignant neoplasm of prostate: Secondary | ICD-10-CM | POA: Insufficient documentation

## 2023-03-22 DIAGNOSIS — D649 Anemia, unspecified: Secondary | ICD-10-CM | POA: Insufficient documentation

## 2023-03-22 DIAGNOSIS — Z79899 Other long term (current) drug therapy: Secondary | ICD-10-CM | POA: Insufficient documentation

## 2023-03-22 DIAGNOSIS — Z8042 Family history of malignant neoplasm of prostate: Secondary | ICD-10-CM | POA: Insufficient documentation

## 2023-03-22 DIAGNOSIS — E785 Hyperlipidemia, unspecified: Secondary | ICD-10-CM | POA: Insufficient documentation

## 2023-03-22 NOTE — Progress Notes (Signed)
Introduced myself to the patient as the prostate nurse navigator.  No barriers to care identified at this time.  He is here to discuss his radiation treatment options.  I gave him my business card and asked him to call me with questions or concerns.  Verbalized understanding.  ?

## 2023-03-22 NOTE — Progress Notes (Signed)
Radiation Oncology         [phone] ________________________________  Initial Outpatient Consultation  Name: Andrew Wright MRN: [id number]  Date: 03/22/2023  DOB: 1960-10-03  DD:UKGUR, Bernadene Bell, MD  Berneice Heinrich Delbert Phenix., *   REFERRING PHYSICIAN: Loletta Parish., *  DIAGNOSIS: 62 y.o. gentleman with Stage T1c adenocarcinoma of the prostate with Gleason score of 3+4, and PSA of 7.1.    ICD-10-CM   1. Malignant neoplasm of prostate (HCC)  C61       HISTORY OF PRESENT ILLNESS: Andrew Wright is a 62 y.o. male with a diagnosis of prostate cancer. He has a history of elevated but fluctuating PSA, previously followed by Dr. Vernie Ammons until he retired in 2020. His PSA subsequently normalized, and he returned to follow up with his PCP. More recently, he was noted to have an elevated PSA of 7.1 by his primary care physician, Dr. Yetta Barre.  Accordingly, he was referred for evaluation in urology by Dr. Berneice Heinrich on 12/20/22,  digital rectal examination performed at that time showed no nodules or induration.  The patient proceeded to transrectal ultrasound with 12 biopsies of the prostate on 02/11/23.  The prostate volume measured 42 cc.  Out of 12 core biopsies, 6 were positive, all on the right side.  The maximum Gleason score was 3+4, and this was seen in all six right-sided cores. Of note, perineural invasion was seen in four of the samples.  The patient reviewed the biopsy results with his urologist and he has kindly been referred today for discussion of potential radiation treatment options.   PREVIOUS RADIATION THERAPY: No  PAST MEDICAL HISTORY:  Past Medical History:  Diagnosis Date   Anemia    Anxiety    Depression    Glaucoma    Hyperlipidemia    Hyperplastic colon polyp    Hypertension    Hypogonadism male 2010   Kidney stones    Migraine    Precordial catch syndrome 07/2021      PAST SURGICAL HISTORY: Past Surgical History:  Procedure Laterality Date   COLONOSCOPY   08/18/2009   [place]   PENECTOMY     for blockage in penis   PROSTATE BIOPSY     WISDOM TOOTH EXTRACTION      FAMILY HISTORY:  Family History  Problem Relation Age of Onset   Breast cancer Mother    Stomach cancer Mother    Prostate cancer Father    Skin cancer Sister    Leukemia Brother    Skin cancer Brother    Arthritis Other    Stroke Other    Colon cancer Neg Hx    Rectal cancer Neg Hx    Esophageal cancer Neg Hx    Colon polyps Neg Hx     SOCIAL HISTORY: He and his wife Alvino Chapel live in [place]. She is Public house manager of the school of Arts at [employer] and he is a Social worker at [employer] Social History   Socioeconomic History   Marital status: Married    Spouse name: Not on file   Number of children: Not on file   Years of education: Not on file   Highest education level: Not on file  Occupational History   Not on file  Tobacco Use   Smoking status: Never   Smokeless tobacco: Never  Vaping Use   Vaping status: Never Used  Substance and Sexual Activity   Alcohol use: No   Drug use: No   Sexual  activity: Yes  Other Topics Concern   Not on file  Social History Narrative   Caffienated drinks-yes   Seat belt use often-yes   Regular Exercise-yes   Smoke alarm in the home-yes   Firearms/guns in the home-no   History of physical abuse-no                  Social Determinants of Health   Financial Resource Strain: Not on file  Food Insecurity: No Food Insecurity (03/22/2023)   Hunger Vital Sign    Worried About Running Out of Food in the Last Year: Never true    Ran Out of Food in the Last Year: Never true  Transportation Needs: No Transportation Needs (03/22/2023)   PRAPARE - Administrator, Civil Service (Medical): No    Lack of Transportation (Non-Medical): No  Physical Activity: Not on file  Stress: Not on file  Social Connections: Unknown (01/09/2022)   Received from [facility]   Social Network     Social Network: Not on file  Intimate Partner Violence: Not At Risk (03/22/2023)   Humiliation, Afraid, Rape, and Kick questionnaire    Fear of Current or Ex-Partner: No    Emotionally Abused: No    Physically Abused: No    Sexually Abused: No    ALLERGIES: Lovaza [omega-3-acid ethyl esters], Asa [aspirin], Hydrocodone, and Penicillins  MEDICATIONS:  Current Outpatient Medications  Medication Sig Dispense Refill   Ferrous Sulfate (BL IRON PO) Take by mouth.     latanoprost (XALATAN) 0.005 % ophthalmic solution instill 1 drop INTO AFFECTED EYE ONCE EVERY EVENING  0   magnesium 30 MG tablet Take 30 mg by mouth 2 (two) times daily.     milk thistle 175 MG tablet Take 175 mg by mouth daily.     rosuvastatin (CRESTOR) 5 MG tablet Take 1 tablet (5 mg total) by mouth daily. 90 tablet 1   No current facility-administered medications for this encounter.    REVIEW OF SYSTEMS:  On review of systems, the patient reports that he is doing well overall. He denies any chest pain, shortness of breath, cough, fevers, chills, night sweats, unintended weight changes. He denies any bowel disturbances, and denies abdominal pain, nausea or vomiting. He denies any new musculoskeletal or joint aches or pains. His IPSS was 6, indicating mild urinary symptoms. His SHIM was 10, indicating he has moderate erectile dysfunction. A complete review of systems is obtained and is otherwise negative.    PHYSICAL EXAM:  Wt Readings from Last 3 Encounters:  03/22/23 198 lb 12.8 oz (90.2 kg)  11/14/22 195 lb (88.5 kg)  10/17/21 193 lb (87.5 kg)   Temp Readings from Last 3 Encounters:  03/22/23 97.8 F (36.6 C)  11/14/22 98.3 F (36.8 C) (Oral)  10/17/21 98 F (36.7 C) (Oral)   BP Readings from Last 3 Encounters:  03/22/23 (!) 142/84  11/14/22 132/82  10/17/21 122/76   Pulse Readings from Last 3 Encounters:  03/22/23 86  11/14/22 89  10/17/21 86   Pain Assessment Pain Score: 0-No pain/10  In general this  is a well appearing Caucasian man in no acute distress. He's alert and oriented x4 and appropriate throughout the examination. Cardiopulmonary assessment is negative for acute distress, and he exhibits normal effort.     KPS = 100  100 - Normal; no complaints; no evidence of disease. 90   - Able to carry on normal activity; minor signs or symptoms of disease. 80   -  Normal activity with effort; some signs or symptoms of disease. 50   - Cares for self; unable to carry on normal activity or to do active work. 60   - Requires occasional assistance, but is able to care for most of his personal needs. 50   - Requires considerable assistance and frequent medical care. 40   - Disabled; requires special care and assistance. 30   - Severely disabled; hospital admission is indicated although death not imminent. 20   - Very sick; hospital admission necessary; active supportive treatment necessary. 10   - Moribund; fatal processes progressing rapidly. 0     - Dead  Karnofsky DA, Abelmann WH, Craver LS and Burchenal [facility] [phone]) The use of the nitrogen mustards in the palliative treatment of carcinoma: with particular reference to bronchogenic carcinoma Cancer 1 634-56  LABORATORY DATA:  Lab Results  Component Value Date   WBC 4.6 11/12/2022   HGB 15.3 11/12/2022   HCT 44.4 11/12/2022   MCV 91.6 11/12/2022   PLT 202.0 11/12/2022   Lab Results  Component Value Date   NA 143 11/12/2022   K 4.5 11/12/2022   CL 104 11/12/2022   CO2 30 11/12/2022   Lab Results  Component Value Date   ALT 38 11/12/2022   AST 25 11/12/2022   ALKPHOS 111 11/12/2022   BILITOT 0.6 11/12/2022     RADIOGRAPHY: No results found.    IMPRESSION/PLAN: 1. 62 y.o. gentleman with Stage T1c adenocarcinoma of the prostate with Gleason Score of 3+4, and PSA of 7.1. We discussed the patient's workup and outlined the nature of prostate cancer in this setting. The patient's T stage, Gleason's score, and PSA put him into the  favorable intermediate risk group. Accordingly, he is eligible for a variety of potential treatment options including brachytherapy, 5.5 weeks of external radiation, or prostatectomy. We discussed the available radiation techniques, and focused on the details and logistics of delivery. We discussed and outlined the risks, benefits, short and long-term effects associated with radiotherapy and compared and contrasted these with prostatectomy. We discussed the role of SpaceOAR gel in reducing the rectal toxicity associated with radiotherapy. He appears to have a good understanding of his disease and our treatment recommendations which are of curative intent.  He was encouraged to ask questions that were answered to his stated satisfaction.  At the conclusion of our conversation, the patient is interested in moving forward with 5.5 weeks of external beam therapy. We will share our discussion with Dr. Berneice Heinrich and make arrangements for fiducial markers and SpaceOAR gel placement, first available, prior to simulation, to reduce rectal toxicity from radiotherapy. The patient appears to have a good understanding of his disease and our treatment recommendations which are of curative intent and is in agreement with the stated plan.  Therefore, we will move forward with treatment planning accordingly, in anticipation of beginning IMRT in the near future.  We personally spent 70 minutes in this encounter including chart review, reviewing radiological studies, meeting face-to-face with the patient, entering orders and completing documentation.    Marguarite Arbour, PA-C    Margaretmary Dys, MD  [facility]  Radiation Oncology Direct Dial: [phone]  Fax: [phone] [email or web address]  Skype  [email or web address]   This document serves as a record of services personally performed by Margaretmary Dys, MD and Marcello Fennel, PA-C. It was created on their behalf by Mickie Bail, a trained medical scribe. The creation of  this record is based on the scribe's personal observations and  the provider's statements to them. This document has been checked and approved by the attending provider.

## 2023-04-11 ENCOUNTER — Encounter (INDEPENDENT_AMBULATORY_CARE_PROVIDER_SITE_OTHER): Payer: Self-pay

## 2023-04-16 ENCOUNTER — Telehealth: Payer: Self-pay

## 2023-04-16 NOTE — Telephone Encounter (Signed)
[facility] Clinical Social Work  Clinical Social Work was referred by self for assessment of psychosocial needs.  Clinical Social Worker attempted to contact patient by phone to offer support and assess for needs. Patient and social worker have been attempting to reach each other and subsequently having to leave voicemail's. CSW left vm with times to touch reach back to CSW if needed.   Marguerita Merles, LCSWA Clinical Social Worker [facility]

## 2023-04-24 ENCOUNTER — Other Ambulatory Visit: Payer: Self-pay | Admitting: Urology

## 2023-04-24 NOTE — Progress Notes (Signed)
RN left message for patient to call back to update on upcoming appointments for starting radiation treatment.

## 2023-04-30 ENCOUNTER — Other Ambulatory Visit: Payer: Self-pay | Admitting: Urology

## 2023-05-16 ENCOUNTER — Encounter: Payer: Self-pay | Admitting: Internal Medicine

## 2023-05-16 ENCOUNTER — Encounter (HOSPITAL_BASED_OUTPATIENT_CLINIC_OR_DEPARTMENT_OTHER): Payer: Self-pay | Admitting: Urology

## 2023-05-16 ENCOUNTER — Ambulatory Visit (INDEPENDENT_AMBULATORY_CARE_PROVIDER_SITE_OTHER): Payer: 59 | Admitting: Internal Medicine

## 2023-05-16 VITALS — BP 128/78 | HR 93 | Temp 98.6°F | Ht 75.0 in | Wt 200.0 lb

## 2023-05-16 DIAGNOSIS — Z23 Encounter for immunization: Secondary | ICD-10-CM | POA: Diagnosis not present

## 2023-05-16 DIAGNOSIS — E785 Hyperlipidemia, unspecified: Secondary | ICD-10-CM

## 2023-05-16 NOTE — Patient Instructions (Signed)

## 2023-05-16 NOTE — Progress Notes (Signed)
Spoke w/ via phone for pre-op interview--- pt Lab needs dos----   no      Lab results------ no COVID test -----patient states asymptomatic no test needed Arrive at ------- 0700 on 05-24-2023 NPO after MN NO Solid Food.  Clear liquids from MN until--- 0600 Med rec completed Medications to take morning of surgery ----- none Diabetic medication ----- n/a Patient instructed no nail polish to be worn day of surgery Patient instructed to bring photo id and insurance card day of surgery Patient aware to have Driver (ride ) / caregiver    for 24 hours after surgery - -- wife, josephine Patient Special Instructions ----- will do one fleet enema night before surgery Pre-Op special Instructions ----- n/a Patient verbalized understanding of instructions that were given at this phone interview. Patient denies chest pain, sob, fever, cough at the interview.

## 2023-05-16 NOTE — Progress Notes (Signed)
Subjective:  Patient ID: Andrew Wright, male    DOB: 1961/03/01  Age: 62 y.o. MRN: [id number]  CC: Hyperlipidemia   HPI Andrew Wright presents for f/up --  He is active and denies chest pain, shortness of breath, diaphoresis, or edema.  He has been diagnosed with prostate cancer and will start radiation therapy soon.  Outpatient Medications Prior to Visit  Medication Sig Dispense Refill   Ferrous Sulfate (BL IRON PO) Take 1 tablet by mouth daily.     latanoprost (XALATAN) 0.005 % ophthalmic solution Place 1 drop into both eyes at bedtime.  0   milk thistle 175 MG tablet Take 175 mg by mouth daily. W/  Turmeric and Artichoke     rosuvastatin (CRESTOR) 5 MG tablet Take 1 tablet (5 mg total) by mouth daily. (Patient taking differently: Take 5 mg by mouth at bedtime.) 90 tablet 1   magnesium 30 MG tablet Take 30 mg by mouth 2 (two) times daily.     No facility-administered medications prior to visit.    ROS Review of Systems  Constitutional:  Negative for diaphoresis and fatigue.  HENT: Negative.    Eyes: Negative.   Respiratory:  Negative for cough, chest tightness, shortness of breath and wheezing.   Cardiovascular:  Negative for chest pain, palpitations and leg swelling.  Gastrointestinal:  Negative for abdominal pain, constipation, diarrhea, nausea and vomiting.  Genitourinary: Negative.   Musculoskeletal: Negative.  Negative for arthralgias and joint swelling.  Skin: Negative.   Neurological: Negative.  Negative for dizziness and weakness.  Hematological:  Negative for adenopathy. Does not bruise/bleed easily.  Psychiatric/Behavioral: Negative.      Objective:  BP 128/78 (BP Location: Left Arm, Patient Position: Sitting, Cuff Size: Normal)   Pulse 93   Temp 98.6 F (37 C) (Oral)   Ht 6\' 3"  (1.905 m)   Wt 200 lb (90.7 kg)   SpO2 96%   BMI 25.00 kg/m   BP Readings from Last 3 Encounters:  05/16/23 128/78  03/22/23 (!) 142/84  11/14/22 132/82    Wt Readings  from Last 3 Encounters:  05/16/23 200 lb (90.7 kg)  03/22/23 198 lb 12.8 oz (90.2 kg)  11/14/22 195 lb (88.5 kg)    Physical Exam Vitals reviewed.  Constitutional:      Appearance: Normal appearance.  HENT:     Mouth/Throat:     Mouth: Mucous membranes are moist.  Eyes:     General: No scleral icterus.    Conjunctiva/sclera: Conjunctivae normal.  Cardiovascular:     Rate and Rhythm: Normal rate and regular rhythm.     Pulses: Normal pulses.     Heart sounds: No murmur heard.    No gallop.  Pulmonary:     Effort: Pulmonary effort is normal.     Breath sounds: No stridor. No wheezing, rhonchi or rales.  Abdominal:     General: Abdomen is flat.     Palpations: There is no mass.     Tenderness: There is no abdominal tenderness. There is no guarding.     Hernia: No hernia is present.  Musculoskeletal:        General: Normal range of motion.     Cervical back: Neck supple.     Right lower leg: No edema.     Left lower leg: No edema.  Lymphadenopathy:     Cervical: No cervical adenopathy.  Skin:    General: Skin is warm and dry.  Neurological:     General: No focal  deficit present.     Mental Status: He is alert.  Psychiatric:        Mood and Affect: Mood normal.        Behavior: Behavior normal.     Lab Results  Component Value Date   WBC 4.6 11/12/2022   HGB 15.3 11/12/2022   HCT 44.4 11/12/2022   PLT 202.0 11/12/2022   GLUCOSE 93 11/12/2022   CHOL 169 11/12/2022   TRIG 284.0 (H) 11/12/2022   HDL 48.60 11/12/2022   LDLDIRECT 82.0 11/12/2022   LDLCALC 67 10/17/2021   ALT 38 11/12/2022   AST 25 11/12/2022   NA 143 11/12/2022   K 4.5 11/12/2022   CL 104 11/12/2022   CREATININE 0.84 11/12/2022   BUN 10 11/12/2022   CO2 30 11/12/2022   TSH 1.16 04/25/2020   PSA 7.13 (H) 11/12/2022   INR 1.0 04/25/2020    No results found.  Assessment & Plan:  Need for shingles vaccine -     Varicella-zoster vaccine IM  Dyslipidemia, goal LDL below 130 - LDL goal  achieved. Doing well on the statin      Follow-up: Return in about 3 months (around 08/15/2023).  Sanda Linger, MD

## 2023-05-17 DIAGNOSIS — Z23 Encounter for immunization: Secondary | ICD-10-CM | POA: Insufficient documentation

## 2023-05-24 ENCOUNTER — Encounter (HOSPITAL_BASED_OUTPATIENT_CLINIC_OR_DEPARTMENT_OTHER): Payer: Self-pay | Admitting: Urology

## 2023-05-24 ENCOUNTER — Other Ambulatory Visit: Payer: Self-pay

## 2023-05-24 ENCOUNTER — Encounter (HOSPITAL_BASED_OUTPATIENT_CLINIC_OR_DEPARTMENT_OTHER)
Admission: RE | Disposition: A | Discharge status: Home / Self Care | Payer: Self-pay | Source: Home / Self Care | Attending: Urology

## 2023-05-24 ENCOUNTER — Ambulatory Visit (HOSPITAL_BASED_OUTPATIENT_CLINIC_OR_DEPARTMENT_OTHER): Payer: 59 | Admitting: Anesthesiology

## 2023-05-24 ENCOUNTER — Ambulatory Visit (HOSPITAL_BASED_OUTPATIENT_CLINIC_OR_DEPARTMENT_OTHER)
Admission: RE | Admit: 2023-05-24 | Discharge: 2023-05-24 | Disposition: A | Discharge status: Home / Self Care | Payer: 59 | Attending: Urology | Admitting: Urology

## 2023-05-24 DIAGNOSIS — Z01818 Encounter for other preprocedural examination: Secondary | ICD-10-CM

## 2023-05-24 DIAGNOSIS — Z8042 Family history of malignant neoplasm of prostate: Secondary | ICD-10-CM | POA: Insufficient documentation

## 2023-05-24 DIAGNOSIS — C61 Malignant neoplasm of prostate: Secondary | ICD-10-CM | POA: Insufficient documentation

## 2023-05-24 HISTORY — DX: Unspecified glaucoma: H40.9

## 2023-05-24 HISTORY — DX: Personal history of urinary calculi: Z87.442

## 2023-05-24 HISTORY — DX: Personal history of adenomatous and serrated colon polyps: Z86.0101

## 2023-05-24 HISTORY — DX: Presence of spectacles and contact lenses: Z97.3

## 2023-05-24 HISTORY — DX: Mixed hyperlipidemia: E78.2

## 2023-05-24 HISTORY — DX: Personal history of other diseases of the digestive system: Z87.19

## 2023-05-24 HISTORY — DX: Nonalcoholic steatohepatitis (NASH): K75.81

## 2023-05-24 HISTORY — DX: Nocturia: R35.1

## 2023-05-24 HISTORY — DX: Personal history of colon polyps: Z86.010

## 2023-05-24 HISTORY — DX: Iron deficiency anemia, unspecified: D50.9

## 2023-05-24 HISTORY — DX: Unspecified hemorrhoids: K64.9

## 2023-05-24 HISTORY — PX: GOLD SEED IMPLANT: SHX6343

## 2023-05-24 HISTORY — PX: SPACE OAR INSTILLATION: SHX6769

## 2023-05-24 SURGERY — INSERTION, GOLD SEEDS
Anesthesia: Monitor Anesthesia Care | Site: Prostate

## 2023-05-24 MED ORDER — PROPOFOL 500 MG/50ML IV EMUL
INTRAVENOUS | Status: DC | PRN
  Administered 2023-05-24: 200 ug/kg/min via INTRAVENOUS

## 2023-05-24 MED ORDER — CLINDAMYCIN PHOSPHATE 600 MG/50ML IV SOLN
INTRAVENOUS | Status: AC
  Filled 2023-05-24: qty 50

## 2023-05-24 MED ORDER — SODIUM CHLORIDE (PF) 0.9 % IJ SOLN
INTRAMUSCULAR | Status: DC | PRN
  Administered 2023-05-24: 10 mL

## 2023-05-24 MED ORDER — FENTANYL CITRATE (PF) 100 MCG/2ML IJ SOLN
INTRAMUSCULAR | Status: DC | PRN
  Administered 2023-05-24: 50 ug via INTRAVENOUS

## 2023-05-24 MED ORDER — CLINDAMYCIN PHOSPHATE 600 MG/50ML IV SOLN
600.0000 mg | Freq: Once | INTRAVENOUS | Status: AC
  Administered 2023-05-24: 600 mg via INTRAVENOUS

## 2023-05-24 MED ORDER — ACETAMINOPHEN 500 MG PO TABS
ORAL_TABLET | ORAL | Status: AC
  Filled 2023-05-24: qty 1

## 2023-05-24 MED ORDER — FLEET ENEMA RE ENEM: 1.0000 | ENEMA | Freq: Once | RECTAL | Status: DC

## 2023-05-24 MED ORDER — BUPIVACAINE HCL (PF) 0.25 % IJ SOLN
INTRAMUSCULAR | Status: DC | PRN
  Administered 2023-05-24: 10 mL

## 2023-05-24 MED ORDER — FENTANYL CITRATE (PF) 100 MCG/2ML IJ SOLN
INTRAMUSCULAR | Status: AC
  Filled 2023-05-24: qty 2

## 2023-05-24 MED ORDER — ACETAMINOPHEN 500 MG PO TABS
ORAL_TABLET | ORAL | Status: AC
  Filled 2023-05-24: qty 2

## 2023-05-24 MED ORDER — PROPOFOL 500 MG/50ML IV EMUL
INTRAVENOUS | Status: AC
  Filled 2023-05-24: qty 50

## 2023-05-24 MED ORDER — LACTATED RINGERS IV SOLN: INTRAVENOUS | Status: DC

## 2023-05-24 MED ORDER — PROPOFOL 10 MG/ML IV BOLUS
INTRAVENOUS | Status: DC | PRN
  Administered 2023-05-24: 30 mg via INTRAVENOUS

## 2023-05-24 MED ORDER — ACETAMINOPHEN 500 MG PO TABS
1000.0000 mg | ORAL_TABLET | Freq: Once | ORAL | Status: AC
  Administered 2023-05-24: 1000 mg via ORAL

## 2023-05-24 MED ORDER — MIDAZOLAM HCL 2 MG/2ML IJ SOLN
INTRAMUSCULAR | Status: DC | PRN
  Administered 2023-05-24 (×2): 1 mg via INTRAVENOUS

## 2023-05-24 MED ORDER — MIDAZOLAM HCL 2 MG/2ML IJ SOLN
INTRAMUSCULAR | Status: AC
  Filled 2023-05-24: qty 2

## 2023-05-24 SURGICAL SUPPLY — 26 items
BLADE CLIPPER SENSICLIP SURGIC (BLADE) ×1 IMPLANT
CNTNR URN SCR LID CUP LEK RST (MISCELLANEOUS) ×1 IMPLANT
CONT SPEC 4OZ STRL OR WHT (MISCELLANEOUS) ×1
COVER BACK TABLE 60X90IN (DRAPES) ×1 IMPLANT
DRSG TEGADERM 4X4.75 (GAUZE/BANDAGES/DRESSINGS) ×1 IMPLANT
DRSG TEGADERM 8X12 (GAUZE/BANDAGES/DRESSINGS) ×1 IMPLANT
GAUZE SPONGE 4X4 12PLY STRL (GAUZE/BANDAGES/DRESSINGS) ×1 IMPLANT
GLOVE BIO SURGEON STRL SZ7.5 (GLOVE) ×1 IMPLANT
GLOVE BIOGEL PI IND STRL 6.5 (GLOVE) IMPLANT
GLOVE SURG ORTHO 8.5 STRL (GLOVE) ×1 IMPLANT
IMPL SPACEOAR VUE SYSTEM (Spacer) ×1 IMPLANT
IMPLANT SPACEOAR VUE SYSTEM (Spacer) ×1 IMPLANT
KIT TURNOVER CYSTO (KITS) ×1 IMPLANT
MARKER GOLD PRELOAD 1.2X3 (Urological Implant) ×1 IMPLANT
MARKER SKIN DUAL TIP RULER LAB (MISCELLANEOUS) ×1 IMPLANT
NDL SPNL 22GX3.5 QUINCKE BK (NEEDLE) ×1 IMPLANT
NEEDLE SPNL 22GX3.5 QUINCKE BK (NEEDLE) ×1 IMPLANT
SEED GOLD PRELOAD 1.2X3 (Urological Implant) ×1 IMPLANT
SHEATH ULTRASOUND LF (SHEATH) IMPLANT
SHEATH ULTRASOUND LTX NONSTRL (SHEATH) IMPLANT
SLEEVE SCD COMPRESS KNEE MED (STOCKING) ×1 IMPLANT
SURGILUBE 2OZ TUBE FLIPTOP (MISCELLANEOUS) ×1 IMPLANT
SYR 10ML LL (SYRINGE) ×1 IMPLANT
SYR CONTROL 10ML LL (SYRINGE) ×1 IMPLANT
TOWEL OR 17X24 6PK STRL BLUE (TOWEL DISPOSABLE) ×1 IMPLANT
UNDERPAD 30X36 HEAVY ABSORB (UNDERPADS AND DIAPERS) ×1 IMPLANT

## 2023-05-24 NOTE — Anesthesia Procedure Notes (Signed)
Procedure Name: MAC Date/Time: 05/24/2023 9:47 AM  Performed by: Francie Massing, CRNAPre-anesthesia Checklist: Patient identified, Emergency Drugs available, Suction available and Patient being monitored Oxygen Delivery Method: Simple face mask

## 2023-05-24 NOTE — Op Note (Signed)
Operative Note  Preoperative diagnosis:  1.  Clinically localized adenocarcinoma of the prostate  Postoperative diagnosis: 1.  Clinically localized adenocarcinoma of the prostate  Procedure(s): 1. Placement of fiducial markers into prostate 2. Insertion of SpaceOAR hydrogel   Surgeon: Jettie Pagan, MD  Assistants:  None  Anesthesia:  General  Complications:  None  EBL:  Minimal  Specimens: 1. None  Drains/Catheters: 1.  None  Indication:  Jeramia Saleeby is a 62 y.o. male with clinically localized prostate cancer. After discussing management options for treatment, he elected to proceed with radiotherapy. He presents today for the above procedures. The potential risks, complications, alternative options, and expected recovery course have been discussed in detail with the patient and he has provided informed consent to proceed.  Description of procedure: The patient was administered preoperative antibiotics, placed in the dorsal lithotomy position, and prepped and draped in the usual sterile fashion. Next, transrectal ultrasonography was utilized to visualize the prostate. Three gold fiducial markers were then placed into the prostate via transperineal needles under ultrasound guidance at the right apex, right base, and left mid gland under direct ultrasound guidance. A site in the midline was then selected on the perineum for placement of an 18 g needle with saline. The needle was advanced above the rectum and below Denonvillier's fascia to the mid gland and confirmed to be in the midline on transverse imaging. One cc of saline was injected confirming appropriate expansion of this space. A total of 5 cc of saline was then injected to open the space further bilaterally. The saline syringe was then removed and the SpaceOAR hydrogel was injected with good distribution bilaterally. He tolerated the procedure well and without complications. He was given a voiding trial prior to discharge from  the PACU.  Matt R. Aftyn Nott MD [facility]  Pager: [phone]

## 2023-05-24 NOTE — H&P (Signed)
Office Visit Report     04/09/2023   --------------------------------------------------------------------------------   Andrew Wright  MRN: [id number]  DOB: 01-30-1961, 62 year old Male   PRIMARY CARE:  Etta Grandchild, MD  PRIMARY CARE FAX:  [phone]  REFERRING:  Henderson Newcomer. Verlin Dike, M  PROVIDER:  Sebastian Ache, M.D.  LOCATION:  [facility] [phone]     --------------------------------------------------------------------------------   CC/HPI: 1 - Moderate Risk Prostate Cancer - 6/12 core up to 50% grade 2 cancer on BX 01/2023 on eval rising PSA to 7.1. All Rt sided disease. TRUS 42 mL, no median.   PMH sig for oral surgery, HLD. No ischemic CV disese / blood thinners. No chest/abd surgvery. 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 His PCP is Marcello Moores MD with [facility]   Today "Andrew Wright" is seen in f/u above and rediscuss management options for his prostate cancer. Had producting meeting with rad-onc colleagues in interval and is leaning towards primary external beam radiation.     ALLERGIES: Aspirin Hydrocodone Loraza    MEDICATIONS: Fenofibrate 160 mg tablet  Latanoprost 0.005 % drops Ophthalmic  Omega 3 684 mg-1,200 mg capsule,delayed release Oral  Rosuvastatin Calcium 5 mg tablet     GU PSH: Prostate Needle Biopsy - 02/11/2023       PSH Notes: Surgery Penis  tooth graft   NON-GU PSH: Surgical Pathology, Gross And Microscopic Examination For Prostate Needle - 02/11/2023 Visit Complexity (formerly [facility]1X) - 02/26/2023     GU PMH: Prostate Cancer - 02/26/2023    NON-GU PMH: None   FAMILY HISTORY: Breast Cancer - Runs In Family Death In The Family Mother - Runs In Family Family Health Status - Father alive at age 3 - Runs In Family leukemia - Runs In Family Prostate Cancer - Father   SOCIAL HISTORY: Marital Status: Married Race: White Current Smoking Status: Patient has never smoked.   Tobacco Use  Assessment Completed: Used Tobacco in last 30 days? Does not use smokeless tobacco. Has never drank.  Does not use drugs. Drinks 4+ caffeinated drinks per day. Has not had a blood transfusion.     Notes: Occupation:, Caffeine Use, Alcohol Use, Never A Smoker, Marital History - Currently Married   REVIEW OF SYSTEMS:    GU Review Male:   Patient denies frequent urination, hard to postpone urination, burning/ pain with urination, get up at night to urinate, leakage of urine, stream starts and stops, trouble starting your stream, have to strain to urinate , erection problems, and penile pain.  Gastrointestinal (Upper):   Patient denies nausea, vomiting, and indigestion/ heartburn.  Gastrointestinal (Lower):   Patient denies diarrhea and constipation.  Constitutional:   Patient denies fever, night sweats, weight loss, and fatigue.  Skin:   Patient denies skin rash/ lesion and itching.  Eyes:   Patient denies blurred vision and double vision.  Ears/ Nose/ Throat:   Patient denies sore throat and sinus problems.  Hematologic/Lymphatic:   Patient denies swollen glands and easy bruising.  Cardiovascular:   Patient denies leg swelling and chest pains.  Respiratory:   Patient denies cough and shortness of breath.  Endocrine:   Patient denies excessive thirst.  Musculoskeletal:   Patient denies back pain and joint pain.  Neurological:   Patient denies headaches and dizziness.  Psychologic:   Patient denies depression and anxiety.   VITAL SIGNS: None   GU PHYSICAL EXAMINATION:    Testes: No tenderness, no swelling,  no enlargement left testes. No tenderness, no swelling, no enlargement right testes. Normal location left testes. Normal location right testes. No mass, no cyst, no varicocele, no hydrocele left testes. No mass, no cyst, no varicocele, no hydrocele right testes.  Penis: Circumcised, no warts, no cracks. No dorsal Peyronie's plaques, no left corporal Peyronie's plaques, no right corporal  Peyronie's plaques, no scarring, no warts. No balanitis, no meatal stenosis.  Sphincter Tone: Normal sphincter. No rectal tenderness. No rectal mass.    MULTI-SYSTEM PHYSICAL EXAMINATION:    Constitutional: Well-nourished. No physical deformities. Normally developed. Good grooming. Here with wife, both very pleasant.   Neck: Neck symmetrical, not swollen. Normal tracheal position.  Respiratory: No labored breathing, no use of accessory muscles.   Cardiovascular: Normal temperature, normal extremity pulses, no swelling, no varicosities.  Lymphatic: No enlargement of neck, axillae, groin.  Skin: No paleness, no jaundice, no cyanosis. No lesion, no ulcer, no rash.  Neurologic / Psychiatric: Oriented to time, oriented to place, oriented to person. No depression, no anxiety, no agitation.  Gastrointestinal: No mass, no tenderness, no rigidity, non obese abdomen.  Eyes: Normal conjunctivae. Normal eyelids.  Ears, Nose, Mouth, and Throat: Left ear no scars, no lesions, no masses. Right ear no scars, no lesions, no masses. Nose no scars, no lesions, no masses. Normal hearing. Normal lips.  Musculoskeletal: Normal gait and station of head and neck.     Complexity of Data:  Lab Test Review:   PSA  Records Review:   Pathology Reports, Previous Hospital Records, Previous Patient Records  Urine Test Review:   Urinalysis   10/21/19 03/27/19 03/27/18 12/06/17 09/18/17 09/04/17 02/24/17 01/25/17  PSA  Total PSA 3.95 ng/mL 3.85 ng/mL 3.89 ng/mL 4.57 ng/mL 4.74 ng/mL 6.08 ng/dl 1.61 ng/dl 0.96 ng/dl  Free PSA    0.45 ng/mL 0.93 ng/mL     % Free PSA    43 % PSA 20 % PSA       PROCEDURES:          Visit Complexity - G2211          Urinalysis Dipstick Dipstick Cont'd  Color: Yellow Bilirubin: Neg mg/dL  Appearance: Clear Ketones: Neg mg/dL  Specific Gravity: <=4.098 Blood: Neg ery/uL  pH: 7.5 Protein: Neg mg/dL  Glucose: Neg mg/dL Urobilinogen: 0.2 mg/dL    Nitrites: Neg    Leukocyte Esterase:  Neg leu/uL    ASSESSMENT:      ICD-10 Details  1 GU:   Prostate Cancer - C61 Chronic, Threat to Bodily Function   PLAN:           Schedule Labs: 4 Months - PSA          Document Letter(s):  Created for Patient: Clinical Summary         Notes:   Pt well versed in management options, still favor curative-intent primary therapy at his younger age and excellent functional status.   He opts for primary radiaiton. WE will coordinate fiducial marker placement.   CC: Reginal Lutes MD, radiation oncology  CC: Sanda Linger MD, pt's PCP with [facility]   Urology Preoperative H&P   Chief Complaint: Prostate cancer (localized)  History of Present Illness: Andrew Wright is a 62 y.o. male with localized prostate cancer here for fiducial markers and SpaceOar injection.    Past Medical History:  Diagnosis Date   Anxiety    Depression    Glaucoma, both eyes    Hemorrhoids    History of adenomatous polyp of colon  History of kidney stones    ? in 1990s   History of lower GI bleeding    yrs ago due to hemorrhoids   Hyperlipidemia, mixed    Hypogonadism male    IDA (iron deficiency anemia)    Malignant neoplasm prostate (HCC) 01/2023   primary urologist--- dr Berneice Heinrich  radiation oncologist--- dr Kathrynn Running;  dx 06/ 2024, gleason 3+4   Migraine    NASH (nonalcoholic steatohepatitis)    followed by pcp;  last hepatic panel in epic normal 03/ 2024   Nocturia    Precordial catch syndrome 07/2021   ED dx 08-20-2021;   pt previously evaluated by cardiology in epic in 2019 and last visit w/ dr Rennis Golden 01-08-2020  cardiac CT w/ calcium score zero, no further work-up,   Wears glasses     Past Surgical History:  Procedure Laterality Date   COLONOSCOPY WITH PROPOFOL  08/10/2022   dr Mortimer Fries   CYSTOSCOPY     approx 1990s  per pt had removal of blockage that was causing  pain w/ urination    Allergies:  Allergies  Allergen Reactions   Lovaza [Omega-3-Acid Ethyl Esters] Other (See Comments)     GI upset   Asa [Aspirin] Other (See Comments)    GI upset and hx lower gi bleed (hemorrhoids)   Beef-Derived Products Other (See Comments)    GI upset   Hydrocodone Other (See Comments)    Gave patient hallucinations.    Penicillins Nausea Only    Has patient had a PCN reaction causing immediate rash, facial/tongue/throat swelling, SOB or lightheadedness with hypotension: No Has patient had a PCN reaction causing severe rash involving mucus membranes or skin necrosis: No Has patient had a PCN reaction that required hospitalization No Has patient had a PCN reaction occurring within the last 10 years: No If all of the above answers are "NO", then may proceed with Cephalosporin use.     Family History  Problem Relation Age of Onset   Breast cancer Mother    Stomach cancer Mother    Prostate cancer Father    Skin cancer Sister    Leukemia Brother    Skin cancer Brother    Arthritis Other    Stroke Other    Colon cancer Neg Hx    Rectal cancer Neg Hx    Esophageal cancer Neg Hx    Colon polyps Neg Hx     Social History:  reports that he has never smoked. He has never used smokeless tobacco. He reports that he does not currently use alcohol. He reports that he does not use drugs.  ROS: A complete review of systems was performed.  All systems are negative except for pertinent findings as noted.  Physical Exam:  Vital signs in last 24 hours: Temp:  [97.9 F (36.6 C)] 97.9 F (36.6 C) (09/27 0745) Pulse Rate:  [72] 72 (09/27 0745) Resp:  [14] 14 (09/27 0745) BP: (137)/(88) 137/88 (09/27 0745) SpO2:  [98 %] 98 % (09/27 0745) Weight:  [89.5 kg] 89.5 kg (09/27 0745) Constitutional:  Alert and oriented, No acute distress Cardiovascular: Regular rate and rhythm Respiratory: Normal respiratory effort, Lungs clear bilaterally GI: Abdomen is soft, nontender, nondistended, no abdominal masses GU: No CVA tenderness Lymphatic: No lymphadenopathy Neurologic: Grossly intact, no  focal deficits Psychiatric: Normal mood and affect  Laboratory Data:  No results for input(s): "WBC", "HGB", "HCT", "PLT" in the last 72 hours.  No results for input(s): "NA", "K", "CL", "GLUCOSE", "BUN", "CALCIUM", "CREATININE"  in the last 72 hours.  Invalid input(s): "CO3"   No results found for this or any previous visit (from the past 24 hour(s)). No results found for this or any previous visit (from the past 240 hour(s)).  Renal Function: No results for input(s): "CREATININE" in the last 168 hours. CrCl cannot be calculated (Patient's most recent lab result is older than the maximum 21 days allowed.).  Radiologic Imaging: No results found.  I independently reviewed the above imaging studies.  Assessment and Plan Neno Askin is a 62 y.o. male with localized prostate cancer here for fiducial markers and SpaceOAR injection.   Matt R. Archie Atilano MD 05/24/2023, 9:37 AM  [facility] Pager: [phone]): [phone]

## 2023-05-24 NOTE — Anesthesia Preprocedure Evaluation (Addendum)
Anesthesia Evaluation  Patient identified by MRN, date of birth, ID band Patient awake    Reviewed: Allergy & Precautions, NPO status , Patient's Chart, lab work & pertinent test results  Airway Mallampati: II  TM Distance: >3 FB Neck ROM: Full    Dental  (+) Dental Advisory Given, Teeth Intact   Pulmonary neg pulmonary ROS   Pulmonary exam normal breath sounds clear to auscultation       Cardiovascular negative cardio ROS Normal cardiovascular exam Rhythm:Regular Rate:Normal     Neuro/Psych  Headaches PSYCHIATRIC DISORDERS Anxiety Depression       GI/Hepatic negative GI ROS,,,(+) Hepatitis -  Endo/Other  negative endocrine ROS    Renal/GU negative Renal ROS     Musculoskeletal negative musculoskeletal ROS (+)    Abdominal   Peds  Hematology  (+) Blood dyscrasia, anemia   Anesthesia Other Findings   Reproductive/Obstetrics                             Anesthesia Physical Anesthesia Plan  ASA: 2  Anesthesia Plan: MAC   Post-op Pain Management: Tylenol PO (pre-op)*   Induction: Intravenous  PONV Risk Score and Plan: 2 and Ondansetron, Propofol infusion, TIVA, Treatment may vary due to age or medical condition and Dexamethasone  Airway Management Planned: Natural Airway  Additional Equipment:   Intra-op Plan:   Post-operative Plan:   Informed Consent: I have reviewed the patients History and Physical, chart, labs and discussed the procedure including the risks, benefits and alternatives for the proposed anesthesia with the patient or authorized representative who has indicated his/her understanding and acceptance.     Dental advisory given  Plan Discussed with: CRNA  Anesthesia Plan Comments:         Anesthesia Quick Evaluation

## 2023-05-24 NOTE — Transfer of Care (Signed)
Immediate Anesthesia Transfer of Care Note  Patient: Andrew Wright  Procedure(s) Performed: Procedure(s) (LRB): GOLD SEED IMPLANT (N/A) SPACE OAR INSTILLATION (N/A)  Patient Location: PACU  Anesthesia Type: General  Level of Consciousness: awake, oriented, sedated and patient cooperative  Airway & Oxygen Therapy: Patient Spontanous Breathing and Patient connected to face mask oxygen  Post-op Assessment: Report given to PACU RN and Post -op Vital signs reviewed and stable  Post vital signs: Reviewed and stable  Complications: No apparent anesthesia complications Last Vitals:  Vitals Value Taken Time  BP    Temp    Pulse    Resp    SpO2      Last Pain:  Vitals:   05/24/23 0745  TempSrc: Oral  PainSc: 0-No pain      Patients Stated Pain Goal: 7 (05/24/23 0745)  Complications: No notable events documented.

## 2023-05-24 NOTE — Discharge Instructions (Addendum)
Activity:  You are encouraged to ambulate frequently (about every hour during waking hours) to help prevent blood clots from forming in your legs or lungs.  However, you should not engage in any heavy lifting (> 10-15 lbs), strenuous activity, or straining.  Diet: You should advance your diet as instructed by your physician.  It will be normal to have some bloating, nausea, and abdominal discomfort intermittently.  What to call us about: You should call the office [phone]) if you develop fever > 101 or develop persistent vomiting. Activity:  You are encouraged to ambulate frequently (about every hour during waking hours) to help prevent blood clots from forming in your legs or lungs.  However, you should not engage in any heavy lifting (> 10-15 lbs), strenuous activity, or straining.     Post Anesthesia Home Care Instructions  Activity: Get plenty of rest for the remainder of the day. A responsible individual must stay with you for 24 hours following the procedure.  For the next 24 hours, DO NOT: -Drive a car -Advertising copywriter -Drink alcoholic beverages -Take any medication unless instructed by your physician -Make any legal decisions or sign important papers.  Meals: Start with liquid foods such as gelatin or soup. Progress to regular foods as tolerated. Avoid greasy, spicy, heavy foods. If nausea and/or vomiting occur, drink only clear liquids until the nausea and/or vomiting subsides. Call your physician if vomiting continues.  Special Instructions/Symptoms: Your throat may feel dry or sore from the anesthesia or the breathing tube placed in your throat during surgery. If this causes discomfort, gargle with warm salt water. The discomfort should disappear within 24 hours.  If needed, do not take any Tylenol until after 3:15 pm today.

## 2023-05-27 ENCOUNTER — Encounter (HOSPITAL_BASED_OUTPATIENT_CLINIC_OR_DEPARTMENT_OTHER): Payer: Self-pay | Admitting: Urology

## 2023-05-27 NOTE — Anesthesia Postprocedure Evaluation (Signed)
Anesthesia Post Note  Patient: Andrew Wright  Procedure(s) Performed: GOLD SEED IMPLANT (Prostate) SPACE OAR INSTILLATION (Prostate)     Patient location during evaluation: PACU Anesthesia Type: MAC Level of consciousness: awake and alert Pain management: pain level controlled Vital Signs Assessment: post-procedure vital signs reviewed and stable Respiratory status: spontaneous breathing Cardiovascular status: stable Anesthetic complications: no   No notable events documented.  Last Vitals:  Vitals:   05/24/23 1111 05/24/23 1201  BP: 119/83 123/76  Pulse: 65 70  Resp: 14 16  Temp:    SpO2: 98% 99%    Last Pain:  Vitals:   05/24/23 1201  TempSrc:   PainSc: 0-No pain                 Lewie Loron

## 2023-05-28 ENCOUNTER — Telehealth: Payer: Self-pay | Admitting: *Deleted

## 2023-05-28 NOTE — Telephone Encounter (Signed)
CALLED PATIENT TO REMIND OF SIM APPT. FOR 05-30-23- ARRIVAL TIME- 10:45 AM @ [facility], INFORMED PATIENT TO ARRIVE WITH A FULL BLADDER, SPOKE WITH PATIENT AND HE IS AWARE OF THIS APPT. AND THE INSTRUCTIONS

## 2023-05-29 NOTE — Progress Notes (Signed)
Radiation Oncology         [phone] ________________________________  Name: Andrew Wright MRN: [id number]  Date: 05/30/2023  DOB: 02/22/1961  SIMULATION AND TREATMENT PLANNING NOTE    ICD-10-CM   1. Malignant neoplasm of prostate (HCC)  C61       DIAGNOSIS:  62 y.o. gentleman with Stage T1c adenocarcinoma of the prostate with Gleason Score of 3+4, and PSA of 7.1.   NARRATIVE:  The patient was brought to the CT Simulation planning suite.  Identity was confirmed.  All relevant records and images related to the planned course of therapy were reviewed.  The patient freely provided informed written consent to proceed with treatment after reviewing the details related to the planned course of therapy. The consent form was witnessed and verified by the simulation staff.  Then, the patient was set-up in a stable reproducible supine position for radiation therapy.  A vacuum lock pillow device was custom fabricated to position his legs in a reproducible immobilized position.  Then, I performed a urethrogram under sterile conditions to identify the prostatic apex.  CT images were obtained.  Surface markings were placed.  The CT images were loaded into the planning software.  Then the prostate target and avoidance structures including the rectum, bladder, bowel and hips were contoured.  Treatment planning then occurred.  The radiation prescription was entered and confirmed.  A total of one complex treatment devices was fabricated. I have requested : Intensity Modulated Radiotherapy (IMRT) is medically necessary for this case for the following reason:  Rectal sparing.Marland Kitchen  PLAN:  The patient will receive 70 Gy in 28 fractions.  ________________________________  [name], M.D.

## 2023-05-30 ENCOUNTER — Ambulatory Visit
Admission: RE | Admit: 2023-05-30 | Discharge: 2023-05-30 | Disposition: A | Discharge status: Home / Self Care | Payer: 59 | Source: Ambulatory Visit | Attending: Radiation Oncology | Admitting: Radiation Oncology

## 2023-05-30 DIAGNOSIS — Z510 Encounter for antineoplastic radiation therapy: Secondary | ICD-10-CM | POA: Insufficient documentation

## 2023-05-30 DIAGNOSIS — C61 Malignant neoplasm of prostate: Secondary | ICD-10-CM | POA: Diagnosis present

## 2023-06-03 DIAGNOSIS — Z510 Encounter for antineoplastic radiation therapy: Secondary | ICD-10-CM | POA: Diagnosis not present

## 2023-06-07 ENCOUNTER — Encounter: Payer: Self-pay | Admitting: Family Medicine

## 2023-06-07 ENCOUNTER — Ambulatory Visit: Payer: 59

## 2023-06-07 ENCOUNTER — Ambulatory Visit: Payer: 59 | Admitting: Family Medicine

## 2023-06-07 VITALS — BP 126/84 | HR 86 | Temp 97.8°F | Ht 75.0 in | Wt 202.0 lb

## 2023-06-07 DIAGNOSIS — T189XXA Foreign body of alimentary tract, part unspecified, initial encounter: Secondary | ICD-10-CM | POA: Diagnosis not present

## 2023-06-07 DIAGNOSIS — M79644 Pain in right finger(s): Secondary | ICD-10-CM | POA: Diagnosis not present

## 2023-06-07 NOTE — Patient Instructions (Signed)
Please go downstairs for an X ray.   Keep your right arm straight at night when you sleep. Avoid leaning on your right elbow.  You can use ice or heat and Voltaren gel on your hand. Follow up if not improving.

## 2023-06-07 NOTE — Progress Notes (Signed)
Subjective:     Patient ID: Andrew Wright, male    DOB: 23-Sep-1960, 62 y.o.   MRN: [id number]  Chief Complaint  Patient presents with   n/a    Yesterday when eating breakfast ran tooth over cap of tooth and it was missing, afraid he swallowed it and wants KUB xray    Hand Pain    R hand pain, can't open hand in the morning    Hand Pain  Pertinent negatives include no chest pain.      History of Present Illness          Here with concerns regarding missing crown on tooth since yesterday morning.  He noticed that was missing when he was eating breakfast but is not sure if he swallowed it or not.  Feels at baseline.  Reports right pinky finger stiffness and pain at times, worse in the morning.  He often wakes up with his elbow bent.  Recent chiropractor visit for neck issues. Denies numbness, tingling or weakness.    There are no preventive care reminders to display for this patient.   Past Medical History:  Diagnosis Date   Anxiety    Depression    Glaucoma, both eyes    Hemorrhoids    History of adenomatous polyp of colon    History of kidney stones    ? in 1990s   History of lower GI bleeding    yrs ago due to hemorrhoids   Hyperlipidemia, mixed    Hypogonadism male    IDA (iron deficiency anemia)    Malignant neoplasm prostate (HCC) 01/2023   primary urologist--- dr Berneice Heinrich  radiation oncologist--- dr Kathrynn Running;  dx 06/ 2024, gleason 3+4   Migraine    NASH (nonalcoholic steatohepatitis)    followed by pcp;  last hepatic panel in epic normal 03/ 2024   Nocturia    Precordial catch syndrome 07/2021   ED dx 08-20-2021;   pt previously evaluated by cardiology in epic in 2019 and last visit w/ dr Rennis Golden 01-08-2020  cardiac CT w/ calcium score zero, no further work-up,   Wears glasses     Past Surgical History:  Procedure Laterality Date   COLONOSCOPY WITH PROPOFOL  08/10/2022   dr Mortimer Fries   CYSTOSCOPY     approx 1990s  per pt had removal of blockage that  was causing  pain w/ urination   GOLD SEED IMPLANT N/A 05/24/2023   Procedure: GOLD SEED IMPLANT;  Surgeon: Jannifer Hick, MD;  Location: [facility];  Service: Urology;  Laterality: N/A;   SPACE OAR INSTILLATION N/A 05/24/2023   Procedure: SPACE OAR INSTILLATION;  Surgeon: Jannifer Hick, MD;  Location: [facility];  Service: Urology;  Laterality: N/A;    Family History  Problem Relation Age of Onset   Breast cancer Mother    Stomach cancer Mother    Prostate cancer Father    Skin cancer Sister    Leukemia Brother    Skin cancer Brother    Arthritis Other    Stroke Other    Colon cancer Neg Hx    Rectal cancer Neg Hx    Esophageal cancer Neg Hx    Colon polyps Neg Hx     Social History   Socioeconomic History   Marital status: Married    Spouse name: Not on file   Number of children: Not on file   Years of education: Not on file   Highest education level: Not on  file  Occupational History   Not on file  Tobacco Use   Smoking status: Never   Smokeless tobacco: Never  Vaping Use   Vaping status: Never Used  Substance and Sexual Activity   Alcohol use: Not Currently    Comment: 05-16-2023  per pt last alcohol 1980s   Drug use: Never   Sexual activity: Yes  Other Topics Concern   Not on file  Social History Narrative   Caffienated drinks-yes   Seat belt use often-yes   Regular Exercise-yes   Smoke alarm in the home-yes   Firearms/guns in the home-no   History of physical abuse-no                  Social Determinants of Health   Financial Resource Strain: Not on file  Food Insecurity: No Food Insecurity (03/22/2023)   Hunger Vital Sign    Worried About Running Out of Food in the Last Year: Never true    Ran Out of Food in the Last Year: Never true  Transportation Needs: No Transportation Needs (03/22/2023)   PRAPARE - Administrator, Civil Service (Medical): No    Lack of Transportation (Non-Medical): No  Physical  Activity: Not on file  Stress: Not on file  Social Connections: Unknown (01/09/2022)   Received from [facility], [facility]   Social Network    Social Network: Not on file  Intimate Partner Violence: Not At Risk (03/22/2023)   Humiliation, Afraid, Rape, and Kick questionnaire    Fear of Current or Ex-Partner: No    Emotionally Abused: No    Physically Abused: No    Sexually Abused: No    Outpatient Medications Prior to Visit  Medication Sig Dispense Refill   Ferrous Sulfate (BL IRON PO) Take 1 tablet by mouth daily.     Folic Acid-Vit B6-Vit B12 (FOLBEE) 2.5-25-1 MG TABS tablet Take 1 tablet by mouth as needed.     latanoprost (XALATAN) 0.005 % ophthalmic solution Place 1 drop into both eyes at bedtime.  0   MAGNESIUM GLYCINATE PO Take 400 mg by mouth daily.     milk thistle 175 MG tablet Take 175 mg by mouth daily. W/  Turmeric and Artichoke     rosuvastatin (CRESTOR) 5 MG tablet Take 1 tablet (5 mg total) by mouth daily. (Patient taking differently: Take 5 mg by mouth at bedtime.) 90 tablet 1   No facility-administered medications prior to visit.    Allergies  Allergen Reactions   Lovaza [Omega-3-Acid Ethyl Esters] Other (See Comments)    GI upset   Asa [Aspirin] Other (See Comments)    GI upset and hx lower gi bleed (hemorrhoids)   Beef-Derived Products Other (See Comments)    GI upset   Hydrocodone Other (See Comments)    Gave patient hallucinations.    Penicillins Nausea Only    Has patient had a PCN reaction causing immediate rash, facial/tongue/throat swelling, SOB or lightheadedness with hypotension: No Has patient had a PCN reaction causing severe rash involving mucus membranes or skin necrosis: No Has patient had a PCN reaction that required hospitalization No Has patient had a PCN reaction occurring within the last 10 years: No If all of the above answers are "NO", then may proceed with Cephalosporin use.     Review of Systems  Constitutional:  Negative  for chills and fever.  HENT:  Negative for sore throat.   Respiratory:  Negative for cough, shortness of breath and wheezing.  Cardiovascular:  Negative for chest pain, palpitations and leg swelling.  Gastrointestinal:  Negative for abdominal pain, constipation, diarrhea, nausea and vomiting.  Musculoskeletal:  Positive for joint pain.       Right pinky finger pain and stiffness   Neurological:  Negative for dizziness and focal weakness.       Objective:    Physical Exam Constitutional:      General: He is not in acute distress.    Appearance: He is not ill-appearing.  HENT:     Mouth/Throat:     Lips: Pink.     Mouth: Mucous membranes are moist.     Dentition: Abnormal dentition.     Pharynx: Oropharynx is clear.      Comments: Missing cap of tooth on upper right side. Only metal  screw is visible Eyes:     Extraocular Movements: Extraocular movements intact.     Conjunctiva/sclera: Conjunctivae normal.  Cardiovascular:     Rate and Rhythm: Normal rate and regular rhythm.  Pulmonary:     Effort: Pulmonary effort is normal.     Breath sounds: Normal breath sounds.  Musculoskeletal:     Right elbow: Normal.     Right forearm: Normal.     Right hand: No tenderness. Normal range of motion. Normal strength. Normal sensation. Normal capillary refill. Normal pulse.     Cervical back: Normal range of motion and neck supple. No tenderness.  Lymphadenopathy:     Cervical: No cervical adenopathy.  Skin:    General: Skin is warm and dry.  Neurological:     General: No focal deficit present.     Mental Status: He is alert and oriented to person, place, and time.  Psychiatric:        Mood and Affect: Mood normal.        Behavior: Behavior normal.        Thought Content: Thought content normal.      BP 126/84 (BP Location: Left Arm, Patient Position: Sitting, Cuff Size: Large)   Pulse 86   Temp 97.8 F (36.6 C) (Temporal)   Ht 6\' 3"  (1.905 m)   Wt 202 lb (91.6 kg)   SpO2  98%   BMI 25.25 kg/m  Wt Readings from Last 3 Encounters:  06/07/23 202 lb (91.6 kg)  05/24/23 197 lb 6.4 oz (89.5 kg)  05/16/23 200 lb (90.7 kg)       Assessment & Plan:   Problem List Items Addressed This Visit   None Visit Diagnoses     Swallowed foreign body, initial encounter    -  Primary   Relevant Orders   DG Abd 1 View   Pain of finger of right hand          No red flag symptoms.  KUB ordered.  He will follow-up with oral surgery.  Discussed possible ulnar nerve compression causing right hand pain.  Avoid compression.  Use some form of a splint at night to keep his arm straight and see if this helps.  Try Voltaren gel, ice or heat.  Follow-up if no improvement.  I am having Andrew Bolt "Marina Goodell" maintain his latanoprost, Ferrous Sulfate (BL IRON PO), milk thistle, rosuvastatin, MAGNESIUM GLYCINATE PO, and Folic Acid-Vit B6-Vit B12.  No orders of the defined types were placed in this encounter.

## 2023-06-13 ENCOUNTER — Ambulatory Visit
Admission: RE | Admit: 2023-06-13 | Discharge: 2023-06-13 | Disposition: A | Discharge status: Home / Self Care | Payer: 59 | Source: Ambulatory Visit | Attending: Radiation Oncology | Admitting: Radiation Oncology

## 2023-06-13 ENCOUNTER — Other Ambulatory Visit: Payer: Self-pay

## 2023-06-13 DIAGNOSIS — Z510 Encounter for antineoplastic radiation therapy: Secondary | ICD-10-CM | POA: Diagnosis not present

## 2023-06-13 LAB — RAD ONC ARIA SESSION SUMMARY
Course Elapsed Days: 0
Plan Fractions Treated to Date: 1
Plan Prescribed Dose Per Fraction: 2.5 Gy
Plan Total Fractions Prescribed: 28
Plan Total Prescribed Dose: 70 Gy
Reference Point Dosage Given to Date: 2.5 Gy
Reference Point Session Dosage Given: 2.5 Gy
Session Number: 1

## 2023-06-14 ENCOUNTER — Other Ambulatory Visit: Payer: Self-pay

## 2023-06-14 ENCOUNTER — Ambulatory Visit
Admission: RE | Admit: 2023-06-14 | Discharge: 2023-06-14 | Disposition: A | Discharge status: Home / Self Care | Payer: 59 | Source: Ambulatory Visit | Attending: Radiation Oncology

## 2023-06-14 DIAGNOSIS — Z510 Encounter for antineoplastic radiation therapy: Secondary | ICD-10-CM | POA: Diagnosis not present

## 2023-06-14 LAB — RAD ONC ARIA SESSION SUMMARY
Course Elapsed Days: 1
Plan Fractions Treated to Date: 2
Plan Prescribed Dose Per Fraction: 2.5 Gy
Plan Total Fractions Prescribed: 28
Plan Total Prescribed Dose: 70 Gy
Reference Point Dosage Given to Date: 5 Gy
Reference Point Session Dosage Given: 2.5 Gy
Session Number: 2

## 2023-06-17 ENCOUNTER — Other Ambulatory Visit: Payer: Self-pay

## 2023-06-17 ENCOUNTER — Ambulatory Visit
Admission: RE | Admit: 2023-06-17 | Discharge: 2023-06-17 | Disposition: A | Discharge status: Home / Self Care | Payer: 59 | Source: Ambulatory Visit | Attending: Radiation Oncology | Admitting: Radiation Oncology

## 2023-06-17 DIAGNOSIS — Z510 Encounter for antineoplastic radiation therapy: Secondary | ICD-10-CM | POA: Diagnosis not present

## 2023-06-17 LAB — RAD ONC ARIA SESSION SUMMARY
Course Elapsed Days: 4
Plan Fractions Treated to Date: 3
Plan Prescribed Dose Per Fraction: 2.5 Gy
Plan Total Fractions Prescribed: 28
Plan Total Prescribed Dose: 70 Gy
Reference Point Dosage Given to Date: 7.5 Gy
Reference Point Session Dosage Given: 2.5 Gy
Session Number: 3

## 2023-06-18 ENCOUNTER — Ambulatory Visit
Admission: RE | Admit: 2023-06-18 | Discharge: 2023-06-18 | Disposition: A | Discharge status: Home / Self Care | Payer: 59 | Source: Ambulatory Visit | Attending: Radiation Oncology | Admitting: Radiation Oncology

## 2023-06-18 ENCOUNTER — Other Ambulatory Visit: Payer: Self-pay

## 2023-06-18 DIAGNOSIS — Z510 Encounter for antineoplastic radiation therapy: Secondary | ICD-10-CM | POA: Diagnosis not present

## 2023-06-18 LAB — RAD ONC ARIA SESSION SUMMARY
Course Elapsed Days: 5
Plan Fractions Treated to Date: 4
Plan Prescribed Dose Per Fraction: 2.5 Gy
Plan Total Fractions Prescribed: 28
Plan Total Prescribed Dose: 70 Gy
Reference Point Dosage Given to Date: 10 Gy
Reference Point Session Dosage Given: 2.5 Gy
Session Number: 4

## 2023-06-19 ENCOUNTER — Other Ambulatory Visit: Payer: Self-pay

## 2023-06-19 ENCOUNTER — Ambulatory Visit
Admission: RE | Admit: 2023-06-19 | Discharge: 2023-06-19 | Disposition: A | Discharge status: Home / Self Care | Payer: 59 | Source: Ambulatory Visit | Attending: Radiation Oncology | Admitting: Radiation Oncology

## 2023-06-19 DIAGNOSIS — Z510 Encounter for antineoplastic radiation therapy: Secondary | ICD-10-CM | POA: Diagnosis not present

## 2023-06-19 LAB — RAD ONC ARIA SESSION SUMMARY
Course Elapsed Days: 6
Plan Fractions Treated to Date: 5
Plan Prescribed Dose Per Fraction: 2.5 Gy
Plan Total Fractions Prescribed: 28
Plan Total Prescribed Dose: 70 Gy
Reference Point Dosage Given to Date: 12.5 Gy
Reference Point Session Dosage Given: 2.5 Gy
Session Number: 5

## 2023-06-20 ENCOUNTER — Other Ambulatory Visit: Payer: Self-pay

## 2023-06-20 ENCOUNTER — Ambulatory Visit
Admission: RE | Admit: 2023-06-20 | Discharge: 2023-06-20 | Disposition: A | Discharge status: Home / Self Care | Payer: 59 | Source: Ambulatory Visit | Attending: Radiation Oncology | Admitting: Radiation Oncology

## 2023-06-20 DIAGNOSIS — Z510 Encounter for antineoplastic radiation therapy: Secondary | ICD-10-CM | POA: Diagnosis not present

## 2023-06-20 LAB — RAD ONC ARIA SESSION SUMMARY
Course Elapsed Days: 7
Plan Fractions Treated to Date: 6
Plan Prescribed Dose Per Fraction: 2.5 Gy
Plan Total Fractions Prescribed: 28
Plan Total Prescribed Dose: 70 Gy
Reference Point Dosage Given to Date: 15 Gy
Reference Point Session Dosage Given: 2.5 Gy
Session Number: 6

## 2023-06-21 ENCOUNTER — Ambulatory Visit
Admission: RE | Admit: 2023-06-21 | Discharge: 2023-06-21 | Disposition: A | Discharge status: Home / Self Care | Payer: 59 | Source: Ambulatory Visit | Attending: Radiation Oncology

## 2023-06-21 ENCOUNTER — Other Ambulatory Visit: Payer: Self-pay

## 2023-06-21 DIAGNOSIS — Z510 Encounter for antineoplastic radiation therapy: Secondary | ICD-10-CM | POA: Diagnosis not present

## 2023-06-21 LAB — RAD ONC ARIA SESSION SUMMARY
Course Elapsed Days: 8
Plan Fractions Treated to Date: 7
Plan Prescribed Dose Per Fraction: 2.5 Gy
Plan Total Fractions Prescribed: 28
Plan Total Prescribed Dose: 70 Gy
Reference Point Dosage Given to Date: 17.5 Gy
Reference Point Session Dosage Given: 2.5 Gy
Session Number: 7

## 2023-06-24 ENCOUNTER — Other Ambulatory Visit: Payer: Self-pay

## 2023-06-24 ENCOUNTER — Ambulatory Visit
Admission: RE | Admit: 2023-06-24 | Discharge: 2023-06-24 | Disposition: A | Discharge status: Home / Self Care | Payer: 59 | Source: Ambulatory Visit | Attending: Radiation Oncology | Admitting: Radiation Oncology

## 2023-06-24 DIAGNOSIS — Z510 Encounter for antineoplastic radiation therapy: Secondary | ICD-10-CM | POA: Diagnosis not present

## 2023-06-24 LAB — RAD ONC ARIA SESSION SUMMARY
Course Elapsed Days: 11
Plan Fractions Treated to Date: 8
Plan Prescribed Dose Per Fraction: 2.5 Gy
Plan Total Fractions Prescribed: 28
Plan Total Prescribed Dose: 70 Gy
Reference Point Dosage Given to Date: 20 Gy
Reference Point Session Dosage Given: 2.5 Gy
Session Number: 8

## 2023-06-25 ENCOUNTER — Ambulatory Visit
Admission: RE | Admit: 2023-06-25 | Discharge: 2023-06-25 | Disposition: A | Discharge status: Home / Self Care | Payer: 59 | Source: Ambulatory Visit | Attending: Radiation Oncology

## 2023-06-25 ENCOUNTER — Other Ambulatory Visit: Payer: Self-pay

## 2023-06-25 DIAGNOSIS — Z510 Encounter for antineoplastic radiation therapy: Secondary | ICD-10-CM | POA: Diagnosis not present

## 2023-06-25 LAB — RAD ONC ARIA SESSION SUMMARY
Course Elapsed Days: 12
Plan Fractions Treated to Date: 9
Plan Prescribed Dose Per Fraction: 2.5 Gy
Plan Total Fractions Prescribed: 28
Plan Total Prescribed Dose: 70 Gy
Reference Point Dosage Given to Date: 22.5 Gy
Reference Point Session Dosage Given: 2.5 Gy
Session Number: 9

## 2023-06-26 ENCOUNTER — Other Ambulatory Visit: Payer: Self-pay

## 2023-06-26 ENCOUNTER — Ambulatory Visit
Admission: RE | Admit: 2023-06-26 | Discharge: 2023-06-26 | Disposition: A | Discharge status: Home / Self Care | Payer: 59 | Source: Ambulatory Visit | Attending: Radiation Oncology

## 2023-06-26 DIAGNOSIS — Z510 Encounter for antineoplastic radiation therapy: Secondary | ICD-10-CM | POA: Diagnosis not present

## 2023-06-26 LAB — RAD ONC ARIA SESSION SUMMARY
Course Elapsed Days: 13
Plan Fractions Treated to Date: 10
Plan Prescribed Dose Per Fraction: 2.5 Gy
Plan Total Fractions Prescribed: 28
Plan Total Prescribed Dose: 70 Gy
Reference Point Dosage Given to Date: 25 Gy
Reference Point Session Dosage Given: 2.5 Gy
Session Number: 10

## 2023-06-27 ENCOUNTER — Ambulatory Visit
Admission: RE | Admit: 2023-06-27 | Discharge: 2023-06-27 | Disposition: A | Discharge status: Home / Self Care | Payer: 59 | Source: Ambulatory Visit | Attending: Radiation Oncology | Admitting: Radiation Oncology

## 2023-06-27 ENCOUNTER — Other Ambulatory Visit: Payer: Self-pay

## 2023-06-27 DIAGNOSIS — Z510 Encounter for antineoplastic radiation therapy: Secondary | ICD-10-CM | POA: Diagnosis not present

## 2023-06-27 LAB — RAD ONC ARIA SESSION SUMMARY
Course Elapsed Days: 14
Plan Fractions Treated to Date: 11
Plan Prescribed Dose Per Fraction: 2.5 Gy
Plan Total Fractions Prescribed: 28
Plan Total Prescribed Dose: 70 Gy
Reference Point Dosage Given to Date: 27.5 Gy
Reference Point Session Dosage Given: 2.5 Gy
Session Number: 11

## 2023-06-28 ENCOUNTER — Other Ambulatory Visit: Payer: Self-pay

## 2023-06-28 ENCOUNTER — Ambulatory Visit: Admission: RE | Admit: 2023-06-28 | Payer: 59 | Source: Ambulatory Visit

## 2023-06-28 ENCOUNTER — Ambulatory Visit
Admission: RE | Admit: 2023-06-28 | Discharge: 2023-06-28 | Disposition: A | Discharge status: Home / Self Care | Payer: 59 | Source: Ambulatory Visit | Attending: Radiation Oncology | Admitting: Radiation Oncology

## 2023-06-28 DIAGNOSIS — C61 Malignant neoplasm of prostate: Secondary | ICD-10-CM | POA: Insufficient documentation

## 2023-06-28 DIAGNOSIS — R350 Frequency of micturition: Secondary | ICD-10-CM | POA: Insufficient documentation

## 2023-06-28 DIAGNOSIS — Z510 Encounter for antineoplastic radiation therapy: Secondary | ICD-10-CM | POA: Diagnosis present

## 2023-06-28 LAB — RAD ONC ARIA SESSION SUMMARY
Course Elapsed Days: 15
Plan Fractions Treated to Date: 12
Plan Prescribed Dose Per Fraction: 2.5 Gy
Plan Total Fractions Prescribed: 28
Plan Total Prescribed Dose: 70 Gy
Reference Point Dosage Given to Date: 30 Gy
Reference Point Session Dosage Given: 2.5 Gy
Session Number: 12

## 2023-07-01 ENCOUNTER — Ambulatory Visit: Payer: 59

## 2023-07-02 ENCOUNTER — Ambulatory Visit
Admission: RE | Admit: 2023-07-02 | Discharge: 2023-07-02 | Disposition: A | Discharge status: Home / Self Care | Payer: 59 | Source: Ambulatory Visit | Attending: Radiation Oncology | Admitting: Radiation Oncology

## 2023-07-02 ENCOUNTER — Other Ambulatory Visit: Payer: Self-pay

## 2023-07-02 DIAGNOSIS — C61 Malignant neoplasm of prostate: Secondary | ICD-10-CM | POA: Diagnosis not present

## 2023-07-02 LAB — RAD ONC ARIA SESSION SUMMARY
Course Elapsed Days: 19
Plan Fractions Treated to Date: 13
Plan Prescribed Dose Per Fraction: 2.5 Gy
Plan Total Fractions Prescribed: 28
Plan Total Prescribed Dose: 70 Gy
Reference Point Dosage Given to Date: 32.5 Gy
Reference Point Session Dosage Given: 2.5 Gy
Session Number: 13

## 2023-07-03 ENCOUNTER — Other Ambulatory Visit: Payer: Self-pay

## 2023-07-03 ENCOUNTER — Ambulatory Visit
Admission: RE | Admit: 2023-07-03 | Discharge: 2023-07-03 | Disposition: A | Discharge status: Home / Self Care | Payer: 59 | Source: Ambulatory Visit | Attending: Radiation Oncology

## 2023-07-03 DIAGNOSIS — C61 Malignant neoplasm of prostate: Secondary | ICD-10-CM | POA: Diagnosis not present

## 2023-07-03 LAB — RAD ONC ARIA SESSION SUMMARY
Course Elapsed Days: 20
Plan Fractions Treated to Date: 14
Plan Prescribed Dose Per Fraction: 2.5 Gy
Plan Total Fractions Prescribed: 28
Plan Total Prescribed Dose: 70 Gy
Reference Point Dosage Given to Date: 35 Gy
Reference Point Session Dosage Given: 2.5 Gy
Session Number: 14

## 2023-07-04 ENCOUNTER — Ambulatory Visit
Admission: RE | Admit: 2023-07-04 | Discharge: 2023-07-04 | Disposition: A | Discharge status: Home / Self Care | Payer: 59 | Source: Ambulatory Visit | Attending: Radiation Oncology | Admitting: Radiation Oncology

## 2023-07-04 ENCOUNTER — Other Ambulatory Visit: Payer: Self-pay

## 2023-07-04 DIAGNOSIS — C61 Malignant neoplasm of prostate: Secondary | ICD-10-CM | POA: Diagnosis not present

## 2023-07-04 LAB — RAD ONC ARIA SESSION SUMMARY
Course Elapsed Days: 21
Plan Fractions Treated to Date: 15
Plan Prescribed Dose Per Fraction: 2.5 Gy
Plan Total Fractions Prescribed: 28
Plan Total Prescribed Dose: 70 Gy
Reference Point Dosage Given to Date: 37.5 Gy
Reference Point Session Dosage Given: 2.5 Gy
Session Number: 15

## 2023-07-05 ENCOUNTER — Other Ambulatory Visit: Payer: Self-pay

## 2023-07-05 ENCOUNTER — Ambulatory Visit
Admission: RE | Admit: 2023-07-05 | Discharge: 2023-07-05 | Disposition: A | Discharge status: Home / Self Care | Payer: 59 | Source: Ambulatory Visit | Attending: Radiation Oncology

## 2023-07-05 ENCOUNTER — Telehealth: Payer: Self-pay

## 2023-07-05 ENCOUNTER — Ambulatory Visit
Admission: RE | Admit: 2023-07-05 | Discharge: 2023-07-05 | Disposition: A | Discharge status: Home / Self Care | Payer: 59 | Source: Ambulatory Visit | Attending: Radiation Oncology | Admitting: Radiation Oncology

## 2023-07-05 DIAGNOSIS — C61 Malignant neoplasm of prostate: Secondary | ICD-10-CM

## 2023-07-05 DIAGNOSIS — R350 Frequency of micturition: Secondary | ICD-10-CM

## 2023-07-05 LAB — RAD ONC ARIA SESSION SUMMARY
Course Elapsed Days: 22
Plan Fractions Treated to Date: 16
Plan Prescribed Dose Per Fraction: 2.5 Gy
Plan Total Fractions Prescribed: 28
Plan Total Prescribed Dose: 70 Gy
Reference Point Dosage Given to Date: 40 Gy
Reference Point Session Dosage Given: 2.5 Gy
Session Number: 16

## 2023-07-05 LAB — URINALYSIS, COMPLETE (UACMP) WITH MICROSCOPIC
Bacteria, UA: NONE SEEN
Bilirubin Urine: NEGATIVE
Glucose, UA: NEGATIVE mg/dL
Hgb urine dipstick: NEGATIVE
Ketones, ur: NEGATIVE mg/dL
Nitrite: NEGATIVE
Protein, ur: NEGATIVE mg/dL
Specific Gravity, Urine: 1.004 — ABNORMAL LOW (ref 1.005–1.030)
pH: 7 (ref 5.0–8.0)

## 2023-07-05 NOTE — Progress Notes (Signed)
Please call patient with normal result.  Thanks. MM 

## 2023-07-05 NOTE — Telephone Encounter (Signed)
RN called patient to inform him of urinalysis results negative and that his urine culture results are still pending.

## 2023-07-06 LAB — URINE CULTURE: Culture: NO GROWTH

## 2023-07-07 NOTE — Progress Notes (Signed)
Please call patient with normal result.  Thanks. MM 

## 2023-07-08 ENCOUNTER — Other Ambulatory Visit: Payer: Self-pay

## 2023-07-08 ENCOUNTER — Telehealth: Payer: Self-pay

## 2023-07-08 ENCOUNTER — Ambulatory Visit
Admission: RE | Admit: 2023-07-08 | Discharge: 2023-07-08 | Disposition: A | Discharge status: Home / Self Care | Payer: 59 | Source: Ambulatory Visit | Attending: Radiation Oncology

## 2023-07-08 DIAGNOSIS — C61 Malignant neoplasm of prostate: Secondary | ICD-10-CM | POA: Diagnosis not present

## 2023-07-08 LAB — RAD ONC ARIA SESSION SUMMARY
Course Elapsed Days: 25
Plan Fractions Treated to Date: 17
Plan Prescribed Dose Per Fraction: 2.5 Gy
Plan Total Fractions Prescribed: 28
Plan Total Prescribed Dose: 70 Gy
Reference Point Dosage Given to Date: 42.5 Gy
Reference Point Session Dosage Given: 2.5 Gy
Session Number: 17

## 2023-07-08 NOTE — Telephone Encounter (Addendum)
RN called patient per Dr. Kathrynn Running to give results of urine culture.  Left message if any questions to call back or we can talk when comes in this afternoon for treatment.

## 2023-07-09 ENCOUNTER — Ambulatory Visit
Admission: RE | Admit: 2023-07-09 | Discharge: 2023-07-09 | Disposition: A | Discharge status: Home / Self Care | Payer: 59 | Source: Ambulatory Visit | Attending: Radiation Oncology | Admitting: Radiation Oncology

## 2023-07-09 ENCOUNTER — Other Ambulatory Visit: Payer: Self-pay

## 2023-07-09 DIAGNOSIS — C61 Malignant neoplasm of prostate: Secondary | ICD-10-CM | POA: Diagnosis not present

## 2023-07-09 LAB — RAD ONC ARIA SESSION SUMMARY
Course Elapsed Days: 26
Plan Fractions Treated to Date: 18
Plan Prescribed Dose Per Fraction: 2.5 Gy
Plan Total Fractions Prescribed: 28
Plan Total Prescribed Dose: 70 Gy
Reference Point Dosage Given to Date: 45 Gy
Reference Point Session Dosage Given: 2.5 Gy
Session Number: 18

## 2023-07-10 ENCOUNTER — Ambulatory Visit
Admission: RE | Admit: 2023-07-10 | Discharge: 2023-07-10 | Disposition: A | Discharge status: Home / Self Care | Payer: 59 | Source: Ambulatory Visit | Attending: Radiation Oncology | Admitting: Radiation Oncology

## 2023-07-10 ENCOUNTER — Other Ambulatory Visit: Payer: Self-pay

## 2023-07-10 DIAGNOSIS — C61 Malignant neoplasm of prostate: Secondary | ICD-10-CM | POA: Diagnosis not present

## 2023-07-10 LAB — RAD ONC ARIA SESSION SUMMARY
Course Elapsed Days: 27
Plan Fractions Treated to Date: 19
Plan Prescribed Dose Per Fraction: 2.5 Gy
Plan Total Fractions Prescribed: 28
Plan Total Prescribed Dose: 70 Gy
Reference Point Dosage Given to Date: 47.5 Gy
Reference Point Session Dosage Given: 2.5 Gy
Session Number: 19

## 2023-07-11 ENCOUNTER — Ambulatory Visit
Admission: RE | Admit: 2023-07-11 | Discharge: 2023-07-11 | Disposition: A | Discharge status: Home / Self Care | Payer: 59 | Source: Ambulatory Visit | Attending: Radiation Oncology | Admitting: Radiation Oncology

## 2023-07-11 ENCOUNTER — Other Ambulatory Visit: Payer: Self-pay

## 2023-07-11 DIAGNOSIS — C61 Malignant neoplasm of prostate: Secondary | ICD-10-CM | POA: Diagnosis not present

## 2023-07-11 LAB — RAD ONC ARIA SESSION SUMMARY
Course Elapsed Days: 28
Plan Fractions Treated to Date: 20
Plan Prescribed Dose Per Fraction: 2.5 Gy
Plan Total Fractions Prescribed: 28
Plan Total Prescribed Dose: 70 Gy
Reference Point Dosage Given to Date: 50 Gy
Reference Point Session Dosage Given: 2.5 Gy
Session Number: 20

## 2023-07-12 ENCOUNTER — Ambulatory Visit
Admission: RE | Admit: 2023-07-12 | Discharge: 2023-07-12 | Disposition: A | Discharge status: Home / Self Care | Payer: 59 | Source: Ambulatory Visit | Attending: Radiation Oncology | Admitting: Radiation Oncology

## 2023-07-12 ENCOUNTER — Ambulatory Visit
Admission: RE | Admit: 2023-07-12 | Discharge: 2023-07-12 | Disposition: A | Discharge status: Home / Self Care | Payer: 59 | Source: Ambulatory Visit | Attending: Radiation Oncology

## 2023-07-12 ENCOUNTER — Other Ambulatory Visit: Payer: Self-pay

## 2023-07-12 DIAGNOSIS — C61 Malignant neoplasm of prostate: Secondary | ICD-10-CM | POA: Diagnosis not present

## 2023-07-12 LAB — RAD ONC ARIA SESSION SUMMARY
Course Elapsed Days: 29
Plan Fractions Treated to Date: 21
Plan Prescribed Dose Per Fraction: 2.5 Gy
Plan Total Fractions Prescribed: 28
Plan Total Prescribed Dose: 70 Gy
Reference Point Dosage Given to Date: 52.5 Gy
Reference Point Session Dosage Given: 2.5 Gy
Session Number: 21

## 2023-07-15 ENCOUNTER — Other Ambulatory Visit: Payer: Self-pay

## 2023-07-15 ENCOUNTER — Ambulatory Visit
Admission: RE | Admit: 2023-07-15 | Discharge: 2023-07-15 | Disposition: A | Discharge status: Home / Self Care | Payer: 59 | Source: Ambulatory Visit | Attending: Radiation Oncology

## 2023-07-15 DIAGNOSIS — C61 Malignant neoplasm of prostate: Secondary | ICD-10-CM | POA: Diagnosis not present

## 2023-07-15 LAB — RAD ONC ARIA SESSION SUMMARY
Course Elapsed Days: 32
Plan Fractions Treated to Date: 22
Plan Prescribed Dose Per Fraction: 2.5 Gy
Plan Total Fractions Prescribed: 28
Plan Total Prescribed Dose: 70 Gy
Reference Point Dosage Given to Date: 55 Gy
Reference Point Session Dosage Given: 2.5 Gy
Session Number: 22

## 2023-07-16 ENCOUNTER — Other Ambulatory Visit: Payer: Self-pay

## 2023-07-16 ENCOUNTER — Ambulatory Visit
Admission: RE | Admit: 2023-07-16 | Discharge: 2023-07-16 | Disposition: A | Discharge status: Home / Self Care | Payer: 59 | Source: Ambulatory Visit | Attending: Radiation Oncology

## 2023-07-16 DIAGNOSIS — C61 Malignant neoplasm of prostate: Secondary | ICD-10-CM | POA: Diagnosis not present

## 2023-07-16 LAB — RAD ONC ARIA SESSION SUMMARY
Course Elapsed Days: 33
Plan Fractions Treated to Date: 23
Plan Prescribed Dose Per Fraction: 2.5 Gy
Plan Total Fractions Prescribed: 28
Plan Total Prescribed Dose: 70 Gy
Reference Point Dosage Given to Date: 57.5 Gy
Reference Point Session Dosage Given: 2.5 Gy
Session Number: 23

## 2023-07-17 ENCOUNTER — Ambulatory Visit
Admission: RE | Admit: 2023-07-17 | Discharge: 2023-07-17 | Disposition: A | Discharge status: Home / Self Care | Payer: 59 | Source: Ambulatory Visit | Attending: Radiation Oncology

## 2023-07-17 ENCOUNTER — Other Ambulatory Visit: Payer: Self-pay | Admitting: Internal Medicine

## 2023-07-17 ENCOUNTER — Other Ambulatory Visit: Payer: Self-pay

## 2023-07-17 DIAGNOSIS — E785 Hyperlipidemia, unspecified: Secondary | ICD-10-CM

## 2023-07-17 DIAGNOSIS — C61 Malignant neoplasm of prostate: Secondary | ICD-10-CM | POA: Diagnosis not present

## 2023-07-17 LAB — RAD ONC ARIA SESSION SUMMARY
Course Elapsed Days: 34
Plan Fractions Treated to Date: 24
Plan Prescribed Dose Per Fraction: 2.5 Gy
Plan Total Fractions Prescribed: 28
Plan Total Prescribed Dose: 70 Gy
Reference Point Dosage Given to Date: 60 Gy
Reference Point Session Dosage Given: 2.5 Gy
Session Number: 24

## 2023-07-18 ENCOUNTER — Ambulatory Visit
Admission: RE | Admit: 2023-07-18 | Discharge: 2023-07-18 | Disposition: A | Discharge status: Home / Self Care | Payer: 59 | Source: Ambulatory Visit | Attending: Radiation Oncology | Admitting: Radiation Oncology

## 2023-07-18 ENCOUNTER — Other Ambulatory Visit: Payer: Self-pay

## 2023-07-18 DIAGNOSIS — C61 Malignant neoplasm of prostate: Secondary | ICD-10-CM | POA: Diagnosis not present

## 2023-07-18 LAB — RAD ONC ARIA SESSION SUMMARY
Course Elapsed Days: 35
Plan Fractions Treated to Date: 25
Plan Prescribed Dose Per Fraction: 2.5 Gy
Plan Total Fractions Prescribed: 28
Plan Total Prescribed Dose: 70 Gy
Reference Point Dosage Given to Date: 62.5 Gy
Reference Point Session Dosage Given: 2.5 Gy
Session Number: 25

## 2023-07-19 ENCOUNTER — Other Ambulatory Visit: Payer: Self-pay

## 2023-07-19 ENCOUNTER — Ambulatory Visit
Admission: RE | Admit: 2023-07-19 | Discharge: 2023-07-19 | Disposition: A | Discharge status: Home / Self Care | Payer: 59 | Source: Ambulatory Visit | Attending: Radiation Oncology

## 2023-07-19 ENCOUNTER — Ambulatory Visit
Admission: RE | Admit: 2023-07-19 | Discharge: 2023-07-19 | Disposition: A | Discharge status: Home / Self Care | Payer: 59 | Source: Ambulatory Visit | Attending: Radiation Oncology | Admitting: Radiation Oncology

## 2023-07-19 DIAGNOSIS — C61 Malignant neoplasm of prostate: Secondary | ICD-10-CM | POA: Diagnosis not present

## 2023-07-19 LAB — RAD ONC ARIA SESSION SUMMARY
Course Elapsed Days: 36
Plan Fractions Treated to Date: 26
Plan Prescribed Dose Per Fraction: 2.5 Gy
Plan Total Fractions Prescribed: 28
Plan Total Prescribed Dose: 70 Gy
Reference Point Dosage Given to Date: 65 Gy
Reference Point Session Dosage Given: 2.5 Gy
Session Number: 26

## 2023-07-20 ENCOUNTER — Emergency Department (HOSPITAL_COMMUNITY): Payer: 59

## 2023-07-20 ENCOUNTER — Other Ambulatory Visit: Payer: Self-pay

## 2023-07-20 ENCOUNTER — Emergency Department (HOSPITAL_COMMUNITY)
Admission: EM | Admit: 2023-07-20 | Discharge: 2023-07-21 | Disposition: A | Discharge status: Home / Self Care | Payer: 59 | Attending: Emergency Medicine | Admitting: Emergency Medicine

## 2023-07-20 DIAGNOSIS — R569 Unspecified convulsions: Secondary | ICD-10-CM | POA: Insufficient documentation

## 2023-07-20 DIAGNOSIS — Z8546 Personal history of malignant neoplasm of prostate: Secondary | ICD-10-CM | POA: Insufficient documentation

## 2023-07-20 LAB — CBC WITH DIFFERENTIAL/PLATELET
Abs Immature Granulocytes: 0.02 10*3/uL (ref 0.00–0.07)
Basophils Absolute: 0 10*3/uL (ref 0.0–0.1)
Basophils Relative: 0 %
Eosinophils Absolute: 0.1 10*3/uL (ref 0.0–0.5)
Eosinophils Relative: 2 %
HCT: 40.7 % (ref 39.0–52.0)
Hemoglobin: 13.6 g/dL (ref 13.0–17.0)
Immature Granulocytes: 0 %
Lymphocytes Relative: 18 %
Lymphs Abs: 0.9 10*3/uL (ref 0.7–4.0)
MCH: 30.2 pg (ref 26.0–34.0)
MCHC: 33.4 g/dL (ref 30.0–36.0)
MCV: 90.2 fL (ref 80.0–100.0)
Monocytes Absolute: 0.5 10*3/uL (ref 0.1–1.0)
Monocytes Relative: 10 %
Neutro Abs: 3.5 10*3/uL (ref 1.7–7.7)
Neutrophils Relative %: 70 %
Platelets: 183 10*3/uL (ref 150–400)
RBC: 4.51 MIL/uL (ref 4.22–5.81)
RDW: 12.3 % (ref 11.5–15.5)
WBC: 5 10*3/uL (ref 4.0–10.5)
nRBC: 0 % (ref 0.0–0.2)

## 2023-07-20 LAB — I-STAT CHEM 8, ED
BUN: 14 mg/dL (ref 8–23)
Calcium, Ion: 1.11 mmol/L — ABNORMAL LOW (ref 1.15–1.40)
Chloride: 102 mmol/L (ref 98–111)
Creatinine, Ser: 1.2 mg/dL (ref 0.61–1.24)
Glucose, Bld: 125 mg/dL — ABNORMAL HIGH (ref 70–99)
HCT: 38 % — ABNORMAL LOW (ref 39.0–52.0)
Hemoglobin: 12.9 g/dL — ABNORMAL LOW (ref 13.0–17.0)
Potassium: 3.6 mmol/L (ref 3.5–5.1)
Sodium: 141 mmol/L (ref 135–145)
TCO2: 24 mmol/L (ref 22–32)

## 2023-07-20 LAB — COMPREHENSIVE METABOLIC PANEL
ALT: 44 U/L (ref 0–44)
AST: 31 U/L (ref 15–41)
Albumin: 3.7 g/dL (ref 3.5–5.0)
Alkaline Phosphatase: 69 U/L (ref 38–126)
Anion gap: 11 (ref 5–15)
BUN: 11 mg/dL (ref 8–23)
CO2: 24 mmol/L (ref 22–32)
Calcium: 8.9 mg/dL (ref 8.9–10.3)
Chloride: 104 mmol/L (ref 98–111)
Creatinine, Ser: 1.2 mg/dL (ref 0.61–1.24)
GFR, Estimated: >60 mL/min (ref >60)
Glucose, Bld: 129 mg/dL — ABNORMAL HIGH (ref 70–99)
Potassium: 3.6 mmol/L (ref 3.5–5.1)
Sodium: 139 mmol/L (ref 135–145)
Total Bilirubin: 0.7 mg/dL (ref <1.2)
Total Protein: 5.9 g/dL — ABNORMAL LOW (ref 6.5–8.1)

## 2023-07-20 LAB — TROPONIN I (HIGH SENSITIVITY): Troponin I (High Sensitivity): 5 ng/L (ref <18)

## 2023-07-20 MED ORDER — LEVETIRACETAM IN NACL 1500 MG/100ML IV SOLN
1500.0000 mg | Freq: Once | INTRAVENOUS | Status: AC
  Administered 2023-07-20: 1500 mg via INTRAVENOUS
  Filled 2023-07-20: qty 100

## 2023-07-20 NOTE — ED Triage Notes (Signed)
Pt BIB [facility] for possible new onset of seizures. Pt has been getting radiology treatment for prostate cancer this past week. Wife witnessed postictal seizure described as "body twitching for a few minutes" Pt bit tongue bleeding is controlled and possible vomit noted by EMS. Per EMS pt had a possible focal seizure for about 30 seconds in route.    EMS gave 100cc NS bolus.

## 2023-07-20 NOTE — ED Provider Notes (Signed)
[place] EMERGENCY DEPARTMENT AT [facility] Provider Note   CSN: [id number] Arrival date & time: 07/20/23  2142     History {Add pertinent medical, surgical, social history, OB history to HPI:1} Chief Complaint  Patient presents with   Seizures    Andrew Wright is a 62 y.o. male.  62 year old male with prior medical history as detailed below presents for evaluation.  Patient comes from home with report of possible seizure-like activity.  Patient without prior history of seizure per report.  Patient had "body twitching for a few minutes".  Patient did bite his tongue.  Bleeding is controlled.  EMS reports the patient did have very brief repeat seizure-like activity during transport.  Patient with history of prostate cancer.  Additional history obtained from wife at bedside on arrival.  Patient with generalized tonic-clonic witnessed seizure activity at home.  Symptoms lasted approximately 5 minutes.  Patient did bite his tongue.  Patient without prior history of seizure.  Patient without known metastatic prostate cancer.  The history is provided by the patient and medical records.       Home Medications Prior to Admission medications   Medication Sig Start Date End Date Taking? Authorizing Provider  Ferrous Sulfate (BL IRON PO) Take 1 tablet by mouth daily.    [provider]  Folic Acid-Vit B6-Vit B12 (FOLBEE) 2.5-25-1 MG TABS tablet Take 1 tablet by mouth as needed.    [provider]  latanoprost (XALATAN) 0.005 % ophthalmic solution Place 1 drop into both eyes at bedtime. 01/10/17   [provider]  MAGNESIUM GLYCINATE PO Take 400 mg by mouth daily.    [provider]  milk thistle 175 MG tablet Take 175 mg by mouth daily. W/  Turmeric and Artichoke    [provider]  rosuvastatin (CRESTOR) 5 MG tablet TAKE 1 TABLET(5 MG) BY MOUTH DAILY 07/17/23   Etta Grandchild, MD      Allergies    Lovaza [omega-3-acid ethyl  esters (fish)], Asa [aspirin], Beef-derived products, Hydrocodone, and Penicillins    Review of Systems   Review of Systems  All other systems reviewed and are negative.   Physical Exam Updated Vital Signs BP 115/72 (BP Location: Right Arm)   Pulse 96   Temp 98.1 F (36.7 C) (Oral)   Resp 13   Ht 6\' 3"  (1.905 m)   Wt 90.7 kg   SpO2 94%   BMI 25.00 kg/m  Physical Exam Vitals and nursing note reviewed.  Constitutional:      General: He is not in acute distress.    Appearance: Normal appearance. He is well-developed.  HENT:     Head: Normocephalic and atraumatic.  Eyes:     Conjunctiva/sclera: Conjunctivae normal.     Pupils: Pupils are equal, round, and reactive to light.  Cardiovascular:     Rate and Rhythm: Normal rate and regular rhythm.     Heart sounds: Normal heart sounds.  Pulmonary:     Effort: Pulmonary effort is normal. No respiratory distress.     Breath sounds: Normal breath sounds.  Abdominal:     General: There is no distension.     Palpations: Abdomen is soft.     Tenderness: There is no abdominal tenderness.  Musculoskeletal:        General: No deformity. Normal range of motion.     Cervical back: Normal range of motion and neck supple.  Skin:    General: Skin is warm and dry.  Neurological:  General: No focal deficit present.     Mental Status: He is alert.     Comments: Alert, oriented to person and place.  Confused as to recent events.  Appears postictal.     ED Results / Procedures / Treatments   Labs (all labs ordered are listed, but only abnormal results are displayed) Labs Reviewed - No data to display  EKG None  Radiology No results found.  Procedures Procedures  {Document cardiac monitor, telemetry assessment procedure when appropriate:1}  Medications Ordered in ED Medications - No data to display  ED Course/ Medical Decision Making/ A&P   {   Click here for ABCD2, HEART and other calculatorsREFRESH Note before signing  :1}                              Medical Decision Making   Medical Screen Complete  This patient presented to the ED with complaint of likely new onset seizure.  This complaint involves an extensive number of treatment options. The initial differential diagnosis includes, but is not limited to, metastatic disease, metabolic abnormality, etc.  This presentation is: Acute, Self-Limited, Previously Undiagnosed, Uncertain Prognosis, Complicated, Systemic Symptoms, and Threat to Life/Bodily Function  Patient with apparent new onset seizure.  Patient is postictal on arrival but otherwise neurologically intact.  Initial dose of 1.5 g of Keppra given on arrival.  Case briefly discussed with Dr. Amada Jupiter with neurology.  Agrees with plan for CT and MRI brain with and without imaging.  Amada Jupiter suggest that patient would benefit from discharge with Keppra if MRI brain is not clearly demonstrative of metastatic disease.  Oncoming EDP aware of case.   Additional history obtained:  External records from outside sources obtained and reviewed including prior ED visits and prior Inpatient records.    Lab Tests:  I ordered and personally interpreted labs.  The pertinent results include: CBC, CMP, troponin   Imaging Studies ordered:  I ordered imaging studies including chest x-ray, CT head, MRI brain    Cardiac Monitoring:  The patient was maintained on a cardiac monitor.  I personally viewed and interpreted the cardiac monitor which showed an underlying rhythm of: NSR   Medicines ordered:  I ordered medication including Keppra for seizure prophylaxis Reevaluation of the patient after these medicines showed that the patient: improved   Problem List / ED Course:  New onset seizure   Reevaluation:  After the interventions noted above, I reevaluated the patient and found that they have: {resolved/improved/worsened:23923::"improved"}   Social Determinants of  Health:  ***   Disposition:  After consideration of the diagnostic results and the patients response to treatment, I feel that the patent would benefit from ***.    {Document critical care time when appropriate:1} {Document review of labs and clinical decision tools ie heart score, Chads2Vasc2 etc:1}  {Document your independent review of radiology images, and any outside records:1} {Document your discussion with family members, caretakers, and with consultants:1} {Document social determinants of health affecting pt's care:1} {Document your decision making why or why not admission, treatments were needed:1} Final Clinical Impression(s) / ED Diagnoses Final diagnoses:  None    Rx / DC Orders ED Discharge Orders     None

## 2023-07-21 ENCOUNTER — Emergency Department (HOSPITAL_COMMUNITY): Payer: 59

## 2023-07-21 ENCOUNTER — Telehealth (HOSPITAL_COMMUNITY): Payer: Self-pay | Admitting: Emergency Medicine

## 2023-07-21 LAB — URINALYSIS, W/ REFLEX TO CULTURE (INFECTION SUSPECTED)
Bilirubin Urine: NEGATIVE
Glucose, UA: NEGATIVE mg/dL
Hgb urine dipstick: NEGATIVE
Ketones, ur: NEGATIVE mg/dL
Leukocytes,Ua: NEGATIVE
Nitrite: NEGATIVE
Protein, ur: 30 mg/dL — AB
Specific Gravity, Urine: 1.019 (ref 1.005–1.030)
pH: 5 (ref 5.0–8.0)

## 2023-07-21 LAB — RAPID URINE DRUG SCREEN, HOSP PERFORMED
Amphetamines: NOT DETECTED
Barbiturates: NOT DETECTED
Benzodiazepines: NOT DETECTED
Cocaine: NOT DETECTED
Opiates: NOT DETECTED
Tetrahydrocannabinol: NOT DETECTED

## 2023-07-21 LAB — TROPONIN I (HIGH SENSITIVITY): Troponin I (High Sensitivity): 10 ng/L (ref <18)

## 2023-07-21 MED ORDER — LEVETIRACETAM 500 MG PO TABS: 500.0000 mg | ORAL_TABLET | Freq: Two times a day (BID) | ORAL | 0 refills | Status: DC

## 2023-07-21 MED ORDER — GADOBUTROL 1 MMOL/ML IV SOLN
10.0000 mL | Freq: Once | INTRAVENOUS | Status: AC | PRN
  Administered 2023-07-21: 10 mL via INTRAVENOUS

## 2023-07-21 NOTE — ED Provider Notes (Signed)
I assumed care in signout to follow-up on patient status.  CT head is negative.  Patient is more alert, and able to answer questions.  No arm or leg drift.  His mental status is appropriate  In consultation with neurology Dr. Amada Jupiter, plan for MRI brain with and without contrast.  If no acute issues, patient may be discharged with close neurology follow-up and to start Keppra 500 mg twice daily   Zadie Rhine, MD 07/21/23 0114

## 2023-07-21 NOTE — ED Notes (Signed)
Pt assisted to bedside commode

## 2023-07-21 NOTE — ED Provider Notes (Signed)
Received signout from previous provider, please see his note for complete H&P.  In short this is a 62 year old male recently history of prostate cancer presenting with seizure-like activities yesterday.  No prior history of seizures.  Workup today is remarkable for a brain MRI which shows asymmetric decreased size and increased flair signal within the left hippocampus suggestive of mesial temporal sclerosis.  This finding was discussed with on-call neurologist Dr. Derry Lory who recommend for antiseizure loading dose and patient to be discharged home with Keppra 500 mg twice daily and close outpatient follow-up with neurology.  Patient would likely need antiepileptic medication for life.  No driving or operate heavy machinery is recommended for at least 6 months until neurology cleared.  BP 111/75   Pulse 82   Temp 98.3 F (36.8 C) (Oral)   Resp 17   Ht 6\' 3"  (1.905 m)   Wt 90.7 kg   SpO2 99%   BMI 25.00 kg/m   Results for orders placed or performed during the hospital encounter of 07/20/23  Comprehensive metabolic panel  Result Value Ref Range   Sodium 139 135 - 145 mmol/L   Potassium 3.6 3.5 - 5.1 mmol/L   Chloride 104 98 - 111 mmol/L   CO2 24 22 - 32 mmol/L   Glucose, Bld 129 (H) 70 - 99 mg/dL   BUN 11 8 - 23 mg/dL   Creatinine, Ser 7.25 0.61 - 1.24 mg/dL   Calcium 8.9 8.9 - 36.6 mg/dL   Total Protein 5.9 (L) 6.5 - 8.1 g/dL   Albumin 3.7 3.5 - 5.0 g/dL   AST 31 15 - 41 U/L   ALT 44 0 - 44 U/L   Alkaline Phosphatase 69 38 - 126 U/L   Total Bilirubin 0.7 <1.2 mg/dL   GFR, Estimated >44 >03 mL/min   Anion gap 11 5 - 15  CBC with Differential  Result Value Ref Range   WBC 5.0 4.0 - 10.5 K/uL   RBC 4.51 4.22 - 5.81 MIL/uL   Hemoglobin 13.6 13.0 - 17.0 g/dL   HCT 47.4 25.9 - 56.3 %   MCV 90.2 80.0 - 100.0 fL   MCH 30.2 26.0 - 34.0 pg   MCHC 33.4 30.0 - 36.0 g/dL   RDW 87.5 64.3 - 32.9 %   Platelets 183 150 - 400 K/uL   nRBC 0.0 0.0 - 0.2 %   Neutrophils Relative % 70 %    Neutro Abs 3.5 1.7 - 7.7 K/uL   Lymphocytes Relative 18 %   Lymphs Abs 0.9 0.7 - 4.0 K/uL   Monocytes Relative 10 %   Monocytes Absolute 0.5 0.1 - 1.0 K/uL   Eosinophils Relative 2 %   Eosinophils Absolute 0.1 0.0 - 0.5 K/uL   Basophils Relative 0 %   Basophils Absolute 0.0 0.0 - 0.1 K/uL   Immature Granulocytes 0 %   Abs Immature Granulocytes 0.02 0.00 - 0.07 K/uL  Urine rapid drug screen (hosp performed)  Result Value Ref Range   Opiates NONE DETECTED NONE DETECTED   Cocaine NONE DETECTED NONE DETECTED   Benzodiazepines NONE DETECTED NONE DETECTED   Amphetamines NONE DETECTED NONE DETECTED   Tetrahydrocannabinol NONE DETECTED NONE DETECTED   Barbiturates NONE DETECTED NONE DETECTED  Urinalysis, w/ Reflex to Culture (Infection Suspected) -Urine, Clean Catch  Result Value Ref Range   Specimen Source URINE, CLEAN CATCH    Color, Urine YELLOW YELLOW   APPearance HAZY (A) CLEAR   Specific Gravity, Urine 1.019 1.005 - 1.030  pH 5.0 5.0 - 8.0   Glucose, UA NEGATIVE NEGATIVE mg/dL   Hgb urine dipstick NEGATIVE NEGATIVE   Bilirubin Urine NEGATIVE NEGATIVE   Ketones, ur NEGATIVE NEGATIVE mg/dL   Protein, ur 30 (A) NEGATIVE mg/dL   Nitrite NEGATIVE NEGATIVE   Leukocytes,Ua NEGATIVE NEGATIVE   RBC / HPF 0-5 0 - 5 RBC/hpf   WBC, UA 0-5 0 - 5 WBC/hpf   Bacteria, UA RARE (A) NONE SEEN   Squamous Epithelial / HPF 0-5 0 - 5 /HPF   Mucus PRESENT   I-stat chem 8, ED  Result Value Ref Range   Sodium 141 135 - 145 mmol/L   Potassium 3.6 3.5 - 5.1 mmol/L   Chloride 102 98 - 111 mmol/L   BUN 14 8 - 23 mg/dL   Creatinine, Ser 9.60 0.61 - 1.24 mg/dL   Glucose, Bld 454 (H) 70 - 99 mg/dL   Calcium, Ion 0.98 (L) 1.15 - 1.40 mmol/L   TCO2 24 22 - 32 mmol/L   Hemoglobin 12.9 (L) 13.0 - 17.0 g/dL   HCT 11.9 (L) 14.7 - 82.9 %  Troponin I (High Sensitivity)  Result Value Ref Range   Troponin I (High Sensitivity) 5 <18 ng/L  Troponin I (High Sensitivity)  Result Value Ref Range   Troponin I  (High Sensitivity) 10 <18 ng/L   MR Brain W and Wo Contrast  Result Date: 07/21/2023 CLINICAL DATA:  62 year old male with new onset seizure. Prostate cancer. EXAM: MRI HEAD WITHOUT AND WITH CONTRAST TECHNIQUE: Multiplanar, multiecho pulse sequences of the brain and surrounding structures were obtained without and with intravenous contrast. CONTRAST:  10mL GADAVIST GADOBUTROL 1 MMOL/ML IV SOLN COMPARISON:  Head CT without contrast 0025 hours today. FINDINGS: Brain: Normal for age cerebral volume. No restricted diffusion to suggest acute infarction. No midline shift, mass effect, evidence of mass lesion, ventriculomegaly, extra-axial collection or acute intracranial hemorrhage. Cervicomedullary junction is within normal limits. 5 mm right pituitary cyst (series 9, image 29) with no regional invasion or mass effect. No further imaging evaluation is necessary. This follows ACR consensus guidelines: Management of Incidental Pituitary Findings on CT, MRI and F18-FDG PET: A White Paper of the ACR Incidental Findings Committee. J Am Coll Radiol 2018; 15: 562-13. Wallace Cullens and white matter signal appears largely normal for age throughout the brain. On thin slice coronal images the left hippocampal formation appears asymmetrically small and indistinct (series 9, image 20), and is asymmetrically FLAIR hyperintense on series 10, image 20. No abnormal diffusion. Other mesial temporal lobe structures appear symmetric and within normal limits. No other encephalomalacia or chronic cerebral blood products identified. No abnormal enhancement identified.  No dural thickening. Vascular: Major intracranial vascular flow voids are preserved. Left anterior clinoid process pneumatization (normal variant). Following contrast the major dural venous sinuses are enhancing and appear to be patent. Skull and upper cervical spine: Visualized bone marrow signal is within normal limits. Negative visible cervical spine. Sinuses/Orbits: Negative.  Other: Visible internal auditory structures appear normal. Negative visible scalp and face. IMPRESSION: 1. Asymmetric decreased size and increased FLAIR signal within the left hippocampus suggests mesial temporal sclerosis on that side. Correlation with EEG may be valuable. 2. But no metastatic disease or superimposed intracranial abnormality identified. And otherwise normal for age MRI appearance of the Brain. Electronically Signed   By: Odessa Fleming M.D.   On: 07/21/2023 07:38   CT Head Wo Contrast  Result Date: 07/21/2023 CLINICAL DATA:  Seizure EXAM: CT HEAD WITHOUT CONTRAST TECHNIQUE: Contiguous axial  images were obtained from the base of the skull through the vertex without intravenous contrast. RADIATION DOSE REDUCTION: This exam was performed according to the departmental dose-optimization program which includes automated exposure control, adjustment of the mA and/or kV according to patient size and/or use of iterative reconstruction technique. COMPARISON:  None Available. FINDINGS: Brain: No evidence of acute infarction, hemorrhage, hydrocephalus, extra-axial collection or mass lesion/mass effect. Vascular: No hyperdense vessel or unexpected calcification. Skull: Normal. Negative for fracture or focal lesion. Sinuses/Orbits: No acute finding. Other: None. IMPRESSION: No acute intracranial abnormality. Electronically Signed   By: Darliss Cheney M.D.   On: 07/21/2023 00:34   DG Chest Port 1 View  Result Date: 07/20/2023 CLINICAL DATA:  Seizure EXAM: PORTABLE CHEST 1 VIEW COMPARISON:  08/20/2021 FINDINGS: The heart size and mediastinal contours are within normal limits. Both lungs are clear. The visualized skeletal structures are unremarkable. IMPRESSION: No active disease. Electronically Signed   By: Minerva Fester M.D.   On: 07/20/2023 22:36      Fayrene Helper, PA-C 07/21/23 1610    Linwood Dibbles, MD 07/21/23 [phone]

## 2023-07-21 NOTE — ED Notes (Signed)
Pt resting in bed, family and pt updated on transport ETA for MRI. NAD noted, call bell within reach. Ongoing care.

## 2023-07-21 NOTE — Discharge Instructions (Signed)
Per [facility] statutes, patients with seizures are not allowed to drive until they have been seizure-free for six months.  Other recommendations include using caution when using heavy equipment or power tools. Avoid working on ladders or at heights. Take showers instead of baths.  Do not swim alone.  Ensure the water temperature is not too high on the home water heater. Do not go swimming alone. Do not lock yourself in a room alone (i.e. bathroom). When caring for infants or small children, sit down when holding, feeding, or changing them to minimize risk of injury to the child in the event you have a seizure. Maintain good sleep hygiene. Avoid alcohol.  Also recommend adequate sleep, hydration, good diet and minimize stress.     During the Seizure   - First, ensure adequate ventilation and place patients on the floor on their left side  Loosen clothing around the neck and ensure the airway is patent. If the patient is clenching the teeth, do not force the mouth open with any object as this can cause severe damage - Remove all items from the surrounding that can be hazardous. The patient may be oblivious to what's happening and may not even know what he or she is doing. If the patient is confused and wandering, either gently guide him/her away and block access to outside areas - Reassure the individual and be comforting - Call 911. In most cases, the seizure ends before EMS arrives. However, there are cases when seizures may last over 3 to 5 minutes. Or the individual may have developed breathing difficulties or severe injuries. If a pregnant patient or a person with diabetes develops a seizure, it is prudent to call an ambulance. - Finally, if the patient does not regain full consciousness, then call EMS. Most patients will remain confused for about 45 to 90 minutes after a seizure, so you must use judgment in calling for help. - Avoid restraints but make sure the patient is in a bed with  padded side rails - Place the individual in a lateral position with the neck slightly flexed; this will help the saliva drain from the mouth and prevent the tongue from falling backward - Remove all nearby furniture and other hazards from the area - Provide verbal assurance as the individual is regaining consciousness - Provide the patient with privacy if possible - Call for help and start treatment as ordered by the caregiver   After the Seizure (Postictal Stage)   After a seizure, most patients experience confusion, fatigue, muscle pain and/or a headache. Thus, one should permit the individual to sleep. For the next few days, reassurance is essential. Being calm and helping reorient the person is also of importance.   Most seizures are painless and end spontaneously. Seizures are not harmful to others but can lead to complications such as stress on the lungs, brain and the heart. Individuals with prior lung problems may develop labored breathing and respiratory distress.

## 2023-07-21 NOTE — ED Provider Notes (Signed)
Signed out to dr knapp with MRI brain pending.  If negative he can be discharged    Zadie Rhine, MD 07/21/23 [phone]

## 2023-07-22 ENCOUNTER — Ambulatory Visit: Payer: 59

## 2023-07-22 ENCOUNTER — Other Ambulatory Visit: Payer: Self-pay

## 2023-07-22 ENCOUNTER — Ambulatory Visit
Admission: RE | Admit: 2023-07-22 | Discharge: 2023-07-22 | Disposition: A | Discharge status: Home / Self Care | Payer: 59 | Source: Ambulatory Visit | Attending: Radiation Oncology | Admitting: Radiation Oncology

## 2023-07-22 DIAGNOSIS — C61 Malignant neoplasm of prostate: Secondary | ICD-10-CM | POA: Diagnosis not present

## 2023-07-22 LAB — RAD ONC ARIA SESSION SUMMARY
Course Elapsed Days: 39
Plan Fractions Treated to Date: 27
Plan Prescribed Dose Per Fraction: 2.5 Gy
Plan Total Fractions Prescribed: 28
Plan Total Prescribed Dose: 70 Gy
Reference Point Dosage Given to Date: 67.5 Gy
Reference Point Session Dosage Given: 2.5 Gy
Session Number: 27

## 2023-07-23 ENCOUNTER — Other Ambulatory Visit: Payer: Self-pay

## 2023-07-23 ENCOUNTER — Ambulatory Visit
Admission: RE | Admit: 2023-07-23 | Discharge: 2023-07-23 | Disposition: A | Discharge status: Home / Self Care | Payer: 59 | Source: Ambulatory Visit | Attending: Radiation Oncology | Admitting: Radiation Oncology

## 2023-07-23 DIAGNOSIS — C61 Malignant neoplasm of prostate: Secondary | ICD-10-CM | POA: Diagnosis not present

## 2023-07-23 LAB — RAD ONC ARIA SESSION SUMMARY
Course Elapsed Days: 40
Plan Fractions Treated to Date: 28
Plan Prescribed Dose Per Fraction: 2.5 Gy
Plan Total Fractions Prescribed: 28
Plan Total Prescribed Dose: 70 Gy
Reference Point Dosage Given to Date: 70 Gy
Reference Point Session Dosage Given: 2.5 Gy
Session Number: 28

## 2023-07-24 NOTE — Radiation Completion Notes (Signed)
Patient Name: Andrew Wright, Andrew Wright MRN: [id number] Date of Birth: 1960/10/06 Referring Physician: Sebastian Ache, M.D. Date of Service: 2023-07-24 Radiation Oncologist: Margaretmary Bayley, M.D. [facility] [place]                             RADIATION ONCOLOGY END OF TREATMENT NOTE     Diagnosis: C61 Malignant neoplasm of prostate Staging on 2023-02-11: Malignant neoplasm of prostate (HCC) T=cT1c, N=cN0, M=cM0 Intent: Curative     ==========DELIVERED PLANS==========  First Treatment Date: 2023-06-13 - Last Treatment Date: 2023-07-23   Plan Name: Prostate Site: Prostate Technique: IMRT Mode: Photon Dose Per Fraction: 2.5 Gy Prescribed Dose (Delivered / Prescribed): 70 Gy / 70 Gy Prescribed Fxs (Delivered / Prescribed): 28 / 28     ==========ON TREATMENT VISIT DATES========== 2023-06-13, 2023-06-21, 2023-06-28, 2023-07-05, 2023-07-12, 2023-07-19     ==========UPCOMING VISITS==========       ==========APPENDIX - ON TREATMENT VISIT NOTES==========   See weekly On Treatment Notes in Epic for details.

## 2023-07-26 NOTE — Progress Notes (Signed)
Patient was a RadOnc Consult on 03/22/23 for his stage T1c adenocarcinoma of the prostate with Gleason score of 3+4, and PSA of 7.1.  Patient proceed with treatment recommendations of 5.5 weeks of external beam therapy and had his final radiation treatment on 07/23/23.   Patient is scheduled for a post treatment nurse call on 08/27/23 and I will work to coordinate his post treatment PSA and MD follow up at [facility].   RN spoke with patient and provided education on post treatment PSA monitoring.  All questions answered.  Will follow up with patient once appointment is scheduled.

## 2023-07-29 ENCOUNTER — Encounter: Payer: Self-pay | Admitting: Neurology

## 2023-07-29 ENCOUNTER — Ambulatory Visit (INDEPENDENT_AMBULATORY_CARE_PROVIDER_SITE_OTHER): Payer: 59 | Admitting: Neurology

## 2023-07-29 VITALS — BP 130/79 | HR 88 | Ht 75.75 in | Wt 200.5 lb

## 2023-07-29 DIAGNOSIS — G40209 Localization-related (focal) (partial) symptomatic epilepsy and epileptic syndromes with complex partial seizures, not intractable, without status epilepticus: Secondary | ICD-10-CM | POA: Insufficient documentation

## 2023-07-29 MED ORDER — LEVETIRACETAM 500 MG PO TABS: 500.0000 mg | ORAL_TABLET | Freq: Two times a day (BID) | ORAL | 3 refills | Status: DC

## 2023-07-29 NOTE — Progress Notes (Signed)
Chief Complaint  Patient presents with   New Patient (Initial Visit)    Rm14, wife present, Urgent internal referral for seizure: 07/20/23, mesial temporal sclerosis      ASSESSMENT AND PLAN  Andrew Wright is a 62 y.o. male   Complex partial seizure Left mesial temporal stenosis  EEG  Keppra 500 mg twice daily  No driving until seizure-free for 6 months  Return to clinic with nurse practitioner in 6 months, call clinic for recurrent event  DIAGNOSTIC DATA (LABS, IMAGING, TESTING) - I reviewed patient records, labs, notes, testing and imaging myself where available.   MEDICAL HISTORY:  Andrew Wright, is a 62 year old male, accompanied by his wife seen in request by his primary care physician Dr.  Sanda Linger for evaluation of seizure, initial evaluation was on July 29, 2023  History is obtained from the patient and review of electronic medical records. I personally reviewed pertinent available imaging films in PACS.   PMHx of  HLD Iron Supplement Glaucoma Prostate cancer in May 2024, s/p radiation  He works as a Social worker, presented to emergency room on July 20, 2023 after witnessed seizure by his wife.  He recently underwent radiation treatment for his prostate cancer, at the night of July 20, 2023, he went to bed early, around 8:30 PM, his wife heard a loud grunting noise, when she check on patient, he was in the bed, tonic-clonic movement, seizing, last about 10 minutes, he was postictal confusion.  Patient have no recall of the event, woke up on the ambulance, then in the emergency room, with bilateral tongue biting, urinary incontinence  In early November 2024, he went to dinner with his friend, suddenly he could not recognize his friends, acting strangely, lasting about 5 minutes  In March 2024, he was visiting his brother, went to bed in the bedroom, woke up in different room, face down closing to the sofa, with bilateral tongue biting, body  achy, as if somebody has broke into his house, beating him up,  Wife reported over the past couple years, he had occasionally spells of sudden onset gazed looking in his eyes, swallowing reflex, not responsive, lasting couple minutes, Reviewed MRI of the brain with without contrast, evidence of left mesial temporal lobe sclerosis  Laboratory evaluation showed normal CBC, CMP,  PHYSICAL EXAM:   Vitals:   07/29/23 0835  BP: 130/79  Pulse: 88  Weight: 200 lb 8 oz (90.9 kg)  Height: 6' 3.75" (1.924 m)     Body mass index is 24.57 kg/m.  PHYSICAL EXAMNIATION:  Gen: NAD, conversant, well nourised, well groomed                     Cardiovascular: Regular rate rhythm, no peripheral edema, warm, nontender. Eyes: Conjunctivae clear without exudates or hemorrhage Neck: Supple, no carotid bruits. Pulmonary: Clear to auscultation bilaterally   NEUROLOGICAL EXAM:  MENTAL STATUS: Speech/cognition: Awake, alert, oriented to history taking and casual conversation CRANIAL NERVES: CN II: Visual fields are full to confrontation. Pupils are round equal and briskly reactive to light. CN III, IV, VI: extraocular movement are normal. No ptosis. CN V: Facial sensation is intact to light touch CN VII: Face is symmetric with normal eye closure  CN VIII: Hearing is normal to causal conversation. CN IX, X: Phonation is normal. CN XI: Head turning and shoulder shrug are intact  MOTOR: There is no pronator drift of out-stretched arms. Muscle bulk and tone are normal. Muscle strength is normal.  REFLEXES: Reflexes are 2+ and symmetric at the biceps, triceps, knees, and ankles. Plantar responses are flexor.  SENSORY: Intact to light touch, pinprick and vibratory sensation are intact in fingers and toes.  COORDINATION: There is no trunk or limb dysmetria noted.  GAIT/STANCE: Posture is normal. Gait is steady with normal steps, base, arm swing, and turning. Heel and toe walking are normal. Tandem  gait is normal.  Romberg is absent.  REVIEW OF SYSTEMS:  Full 14 system review of systems performed and notable only for as above All other review of systems were negative.   ALLERGIES: Allergies  Allergen Reactions   Lovaza [Omega-3-Acid Ethyl Esters (Fish)] Other (See Comments)    GI upset   Asa [Aspirin] Other (See Comments)    GI upset and hx lower gi bleed (hemorrhoids)   Beef-Derived Products Other (See Comments)    GI upset   Hydrocodone Other (See Comments)    Gave patient hallucinations.    Penicillins Nausea Only    Has patient had a PCN reaction causing immediate rash, facial/tongue/throat swelling, SOB or lightheadedness with hypotension: No Has patient had a PCN reaction causing severe rash involving mucus membranes or skin necrosis: No Has patient had a PCN reaction that required hospitalization No Has patient had a PCN reaction occurring within the last 10 years: No If all of the above answers are "NO", then may proceed with Cephalosporin use.     HOME MEDICATIONS: Current Outpatient Medications  Medication Sig Dispense Refill   Ferrous Sulfate (BL IRON PO) Take 0.25 tablets by mouth daily.     latanoprost (XALATAN) 0.005 % ophthalmic solution Place 1 drop into both eyes at bedtime.  0   levETIRAcetam (KEPPRA) 500 MG tablet Take 1 tablet (500 mg total) by mouth 2 (two) times daily. 60 tablet 0   MAGNESIUM GLYCINATE PO Take 400 mg by mouth daily.     milk thistle 175 MG tablet Take 175 mg by mouth daily. W/  Turmeric and Artichoke     Pyridoxine HCl (VITAMIN B6 PO) Take 1 tablet by mouth daily.     rosuvastatin (CRESTOR) 5 MG tablet TAKE 1 TABLET(5 MG) BY MOUTH DAILY 90 tablet 0   No current facility-administered medications for this visit.    PAST MEDICAL HISTORY: Past Medical History:  Diagnosis Date   Anxiety    Depression    Glaucoma, both eyes    Hemorrhoids    History of adenomatous polyp of colon    History of kidney stones    ? in 1990s    History of lower GI bleeding    yrs ago due to hemorrhoids   Hyperlipidemia, mixed    Hypogonadism male    IDA (iron deficiency anemia)    Malignant neoplasm prostate (HCC) 01/2023   primary urologist--- dr Berneice Heinrich  radiation oncologist--- dr Kathrynn Running;  dx 06/ 2024, gleason 3+4   Migraine    NASH (nonalcoholic steatohepatitis)    followed by pcp;  last hepatic panel in epic normal 03/ 2024   Nocturia    Precordial catch syndrome 07/2021   ED dx 08-20-2021;   pt previously evaluated by cardiology in epic in 2019 and last visit w/ dr Rennis Golden 01-08-2020  cardiac CT w/ calcium score zero, no further work-up,   Wears glasses     PAST SURGICAL HISTORY: Past Surgical History:  Procedure Laterality Date   COLONOSCOPY WITH PROPOFOL  08/10/2022   dr Mortimer Fries   CYSTOSCOPY     approx 1990s  per  pt had removal of blockage that was causing  pain w/ urination   GOLD SEED IMPLANT N/A 05/24/2023   Procedure: GOLD SEED IMPLANT;  Surgeon: Jannifer Hick, MD;  Location: [facility];  Service: Urology;  Laterality: N/A;   SPACE OAR INSTILLATION N/A 05/24/2023   Procedure: SPACE OAR INSTILLATION;  Surgeon: Jannifer Hick, MD;  Location: [facility];  Service: Urology;  Laterality: N/A;    FAMILY HISTORY: Family History  Problem Relation Age of Onset   Breast cancer Mother    Stomach cancer Mother    Prostate cancer Father    Skin cancer Sister    Leukemia Brother    Skin cancer Brother    Arthritis Other    Stroke Other    Colon cancer Neg Hx    Rectal cancer Neg Hx    Esophageal cancer Neg Hx    Colon polyps Neg Hx     SOCIAL HISTORY: Social History   Socioeconomic History   Marital status: Married    Spouse name: Not on file   Number of children: Not on file   Years of education: Not on file   Highest education level: Not on file  Occupational History   Not on file  Tobacco Use   Smoking status: Never   Smokeless tobacco: Never  Vaping Use   Vaping  status: Never Used  Substance and Sexual Activity   Alcohol use: Not Currently    Comment: 05-16-2023  per pt last alcohol 1980s   Drug use: Never   Sexual activity: Yes  Other Topics Concern   Not on file  Social History Narrative   Caffienated drinks-yes   Seat belt use often-yes   Regular Exercise-yes   Smoke alarm in the home-yes   Firearms/guns in the home-no   History of physical abuse-no                  Social Determinants of Health   Financial Resource Strain: Not on file  Food Insecurity: No Food Insecurity (03/22/2023)   Hunger Vital Sign    Worried About Running Out of Food in the Last Year: Never true    Ran Out of Food in the Last Year: Never true  Transportation Needs: No Transportation Needs (03/22/2023)   PRAPARE - Administrator, Civil Service (Medical): No    Lack of Transportation (Non-Medical): No  Physical Activity: Not on file  Stress: Not on file  Social Connections: Unknown (01/09/2022)   [facility]   Social Network    Social Network: Not on file  Intimate Partner Violence: Not At Risk (03/22/2023)   Humiliation, Afraid, Rape, and Kick questionnaire    Fear of Current or Ex-Partner: No    Emotionally Abused: No    Physically Abused: No    Sexually Abused: No      Levert Feinstein, M.D. Ph.D.  [facility] [address] [place], [place] [address] Ph: [phone] Fax: [phone]  CC:  Zadie Rhine, MD [address] [place],  [place] [address]  Etta Grandchild, MD

## 2023-08-01 ENCOUNTER — Ambulatory Visit: Payer: 59 | Admitting: Internal Medicine

## 2023-08-01 NOTE — Progress Notes (Signed)
Patient is scheduled for a PSA lab appointment on 2/4 at 3:30 and will have MD follow up with Dr. Berneice Heinrich on 2/11 at 3:30.  RN left detailed message on voicemail.  Contact information provided.

## 2023-08-15 ENCOUNTER — Ambulatory Visit: Payer: 59 | Admitting: Internal Medicine

## 2023-08-26 NOTE — Progress Notes (Addendum)
  Radiation Oncology         [phone]) [phone] ________________________________  Name: Andrew Wright MRN: [id number]  Date of Service: 08/26/2023  DOB: 1961-04-10  Post Treatment Telephone Note  Diagnosis:  C61 Malignant neoplasm of prostate (as documented in provider EOT note)  Pre Treatment IPSS Score: 6 (as documented in the provider consult note)  The patient was not available for call today. Voicemail left.  Patient has a scheduled follow up visit with his urologist, Dr. Alvaro, on 10/2023 for ongoing surveillance. He was counseled that PSA levels will be drawn in the urology office, and was reassured that additional time is expected to improve bowel and bladder symptoms. He was encouraged to call back with concerns or questions regarding radiation.    Rosaline Minerva, LPN

## 2023-08-27 ENCOUNTER — Ambulatory Visit
Admission: RE | Admit: 2023-08-27 | Discharge: 2023-08-27 | Disposition: A | Discharge status: Home / Self Care | Payer: 59 | Source: Ambulatory Visit | Attending: Radiation Oncology | Admitting: Radiation Oncology

## 2023-08-27 DIAGNOSIS — C61 Malignant neoplasm of prostate: Secondary | ICD-10-CM | POA: Insufficient documentation

## 2023-08-27 DIAGNOSIS — Z510 Encounter for antineoplastic radiation therapy: Secondary | ICD-10-CM | POA: Insufficient documentation

## 2023-08-27 DIAGNOSIS — R350 Frequency of micturition: Secondary | ICD-10-CM | POA: Insufficient documentation

## 2023-09-05 ENCOUNTER — Ambulatory Visit: Payer: 59 | Admitting: Neurology

## 2023-09-05 DIAGNOSIS — G40209 Localization-related (focal) (partial) symptomatic epilepsy and epileptic syndromes with complex partial seizures, not intractable, without status epilepticus: Secondary | ICD-10-CM | POA: Diagnosis not present

## 2023-09-12 ENCOUNTER — Ambulatory Visit: Payer: 59 | Admitting: Internal Medicine

## 2023-09-12 ENCOUNTER — Encounter: Payer: Self-pay | Admitting: Internal Medicine

## 2023-09-12 VITALS — BP 138/88 | HR 89 | Temp 97.7°F | Ht 75.5 in | Wt 203.0 lb

## 2023-09-12 DIAGNOSIS — D539 Nutritional anemia, unspecified: Secondary | ICD-10-CM | POA: Diagnosis not present

## 2023-09-12 DIAGNOSIS — R739 Hyperglycemia, unspecified: Secondary | ICD-10-CM | POA: Insufficient documentation

## 2023-09-12 DIAGNOSIS — Z23 Encounter for immunization: Secondary | ICD-10-CM

## 2023-09-12 DIAGNOSIS — K7581 Nonalcoholic steatohepatitis (NASH): Secondary | ICD-10-CM | POA: Diagnosis not present

## 2023-09-12 DIAGNOSIS — E781 Pure hyperglyceridemia: Secondary | ICD-10-CM

## 2023-09-12 DIAGNOSIS — E785 Hyperlipidemia, unspecified: Secondary | ICD-10-CM

## 2023-09-12 LAB — BASIC METABOLIC PANEL
BUN: 14 mg/dL (ref 6–23)
CO2: 32 meq/L (ref 19–32)
Calcium: 9.4 mg/dL (ref 8.4–10.5)
Chloride: 104 meq/L (ref 96–112)
Creatinine, Ser: 0.86 mg/dL (ref 0.40–1.50)
GFR: 91.96 mL/min (ref >60.00)
Glucose, Bld: 90 mg/dL (ref 70–99)
Potassium: 3.9 meq/L (ref 3.5–5.1)
Sodium: 142 meq/L (ref 135–145)

## 2023-09-12 LAB — IBC + FERRITIN
Ferritin: 162.2 ng/mL (ref 22.0–322.0)
Iron: 77 ug/dL (ref 42–165)
Saturation Ratios: 19.5 % — ABNORMAL LOW (ref 20.0–50.0)
TIBC: 394.8 ug/dL (ref 250.0–450.0)
Transferrin: 282 mg/dL (ref 212.0–360.0)

## 2023-09-12 LAB — CBC WITH DIFFERENTIAL/PLATELET
Basophils Absolute: 0 10*3/uL (ref 0.0–0.1)
Basophils Relative: 0.6 % (ref 0.0–3.0)
Eosinophils Absolute: 0.1 10*3/uL (ref 0.0–0.7)
Eosinophils Relative: 2 % (ref 0.0–5.0)
HCT: 43 % (ref 39.0–52.0)
Hemoglobin: 14.9 g/dL (ref 13.0–17.0)
Lymphocytes Relative: 23 % (ref 12.0–46.0)
Lymphs Abs: 0.8 10*3/uL (ref 0.7–4.0)
MCHC: 34.6 g/dL (ref 30.0–36.0)
MCV: 92.2 fL (ref 78.0–100.0)
Monocytes Absolute: 0.4 10*3/uL (ref 0.1–1.0)
Monocytes Relative: 10.1 % (ref 3.0–12.0)
Neutro Abs: 2.3 10*3/uL (ref 1.4–7.7)
Neutrophils Relative %: 64.3 % (ref 43.0–77.0)
Platelets: 221 10*3/uL (ref 150.0–400.0)
RBC: 4.67 Mil/uL (ref 4.22–5.81)
RDW: 13.6 % (ref 11.5–15.5)
WBC: 3.6 10*3/uL — ABNORMAL LOW (ref 4.0–10.5)

## 2023-09-12 LAB — PROTIME-INR
INR: 1.2 {ratio} — ABNORMAL HIGH (ref 0.8–1.0)
Prothrombin Time: 12.2 s (ref 9.6–13.1)

## 2023-09-12 LAB — TSH: TSH: 2.31 u[IU]/mL (ref 0.35–5.50)

## 2023-09-12 LAB — HEMOGLOBIN A1C: Hgb A1c MFr Bld: 5.5 % (ref 4.6–6.5)

## 2023-09-12 NOTE — Progress Notes (Signed)
Subjective:  Patient ID: Andrew Wright, male    DOB: 1961/06/13  Age: 63 y.o. MRN: [id number]  CC: Hyperlipidemia   HPI Andrew Wright presents for f/up ----  Discussed the use of AI scribe software for clinical note transcription with the patient, who gave verbal consent to proceed.  History of Present Illness   The patient, with a history of prostate cancer and recent seizure activity, presents for follow-up. He reports having a seizure approximately six weeks ago, with no subsequent episodes since starting Keppra. He denies any known precipitating factors such as head injury or infection. He recalls a similar episode in March, during which he experienced tongue lacerations, but no formal diagnosis of a seizure was made at that time.  The patient also reports recent completion of radiation therapy for prostate cancer, with a follow-up PSA test scheduled in a few weeks. He has been resting and recuperating since the completion of therapy and has resumed teaching one class a day. He denies any symptoms of anemia such as shortness of breath, weakness, fatigue, numbness, tingling, or blood loss.  He has been experiencing urinary issues, with uncertainty about whether he will urinate or defecate when he goes to the bathroom. He has been reassured that this will improve over time. He also expresses concern about memory issues, particularly difficulty remembering his medications, which include cholesterol medication, milk thistle, and magnesium, in addition to his prescribed pyridoxine and iron supplements.  The patient has recently started walking more, which he finds enjoyable and beneficial.  He denies DOE, CP, edema.       Outpatient Medications Prior to Visit  Medication Sig Dispense Refill   Ferrous Sulfate (BL IRON PO) Take 0.25 tablets by mouth daily.     latanoprost (XALATAN) 0.005 % ophthalmic solution Place 1 drop into both eyes at bedtime.  0   levETIRAcetam (KEPPRA) 500 MG tablet  Take 1 tablet (500 mg total) by mouth 2 (two) times daily. 180 tablet 3   MAGNESIUM GLYCINATE PO Take 400 mg by mouth daily.     milk thistle 175 MG tablet Take 175 mg by mouth daily. W/  Turmeric and Artichoke     Pyridoxine HCl (VITAMIN B6 PO) Take 1 tablet by mouth daily.     rosuvastatin (CRESTOR) 5 MG tablet TAKE 1 TABLET(5 MG) BY MOUTH DAILY 90 tablet 0   No facility-administered medications prior to visit.    ROS Review of Systems  Constitutional: Negative.  Negative for appetite change, diaphoresis, fatigue and unexpected weight change.  HENT: Negative.    Eyes: Negative.   Respiratory: Negative.  Negative for cough, chest tightness, shortness of breath and wheezing.   Cardiovascular:  Negative for chest pain, palpitations and leg swelling.  Gastrointestinal:  Negative for abdominal pain, diarrhea, nausea and vomiting.  Endocrine: Negative.   Genitourinary: Negative.  Negative for difficulty urinating.  Musculoskeletal: Negative.  Negative for arthralgias, joint swelling and myalgias.  Skin: Negative.   Neurological:  Negative for dizziness, seizures, weakness and light-headedness.  Hematological:  Negative for adenopathy. Does not bruise/bleed easily.  Psychiatric/Behavioral: Negative.      Objective:  BP 138/88 (BP Location: Left Arm, Patient Position: Sitting, Cuff Size: Normal)   Pulse 89   Temp 97.7 F (36.5 C) (Oral)   Ht 6' 3.5" (1.918 m)   Wt 203 lb (92.1 kg)   SpO2 96%   BMI 25.04 kg/m   BP Readings from Last 3 Encounters:  09/12/23 138/88  07/29/23 130/79  07/21/23  111/75    Wt Readings from Last 3 Encounters:  09/12/23 203 lb (92.1 kg)  07/29/23 200 lb 8 oz (90.9 kg)  07/20/23 200 lb (90.7 kg)    Physical Exam Vitals reviewed.  Constitutional:      Appearance: Normal appearance.  HENT:     Mouth/Throat:     Mouth: Mucous membranes are moist.  Eyes:     General: No scleral icterus.    Conjunctiva/sclera: Conjunctivae normal.   Cardiovascular:     Rate and Rhythm: Normal rate and regular rhythm.     Heart sounds: No murmur heard.    No friction rub. No gallop.  Pulmonary:     Effort: Pulmonary effort is normal.     Breath sounds: No stridor. No wheezing, rhonchi or rales.  Abdominal:     General: Abdomen is flat.     Palpations: There is no mass.     Tenderness: There is no abdominal tenderness. There is no guarding.     Hernia: No hernia is present.  Musculoskeletal:        General: Normal range of motion.     Cervical back: Neck supple.     Right lower leg: No edema.     Left lower leg: No edema.  Lymphadenopathy:     Cervical: No cervical adenopathy.  Skin:    General: Skin is warm and dry.  Neurological:     General: No focal deficit present.     Mental Status: He is alert. Mental status is at baseline.  Psychiatric:        Mood and Affect: Mood normal.     Lab Results  Component Value Date   WBC 3.6 (L) 09/12/2023   HGB 14.9 09/12/2023   HCT 43.0 09/12/2023   PLT 221.0 09/12/2023   GLUCOSE 90 09/12/2023   CHOL 169 11/12/2022   TRIG 284.0 (H) 11/12/2022   HDL 48.60 11/12/2022   LDLDIRECT 82.0 11/12/2022   LDLCALC 67 10/17/2021   ALT 44 07/20/2023   AST 31 07/20/2023   NA 142 09/12/2023   K 3.9 09/12/2023   CL 104 09/12/2023   CREATININE 0.86 09/12/2023   BUN 14 09/12/2023   CO2 32 09/12/2023   TSH 2.31 09/12/2023   PSA 7.13 (H) 11/12/2022   INR 1.2 (H) 09/12/2023   HGBA1C 5.5 09/12/2023    MR Brain W and Wo Contrast Result Date: 07/21/2023 CLINICAL DATA:  63 year old male with new onset seizure. Prostate cancer. EXAM: MRI HEAD WITHOUT AND WITH CONTRAST TECHNIQUE: Multiplanar, multiecho pulse sequences of the brain and surrounding structures were obtained without and with intravenous contrast. CONTRAST:  10mL GADAVIST GADOBUTROL 1 MMOL/ML IV SOLN COMPARISON:  Head CT without contrast 0025 hours today. FINDINGS: Brain: Normal for age cerebral volume. No restricted diffusion to  suggest acute infarction. No midline shift, mass effect, evidence of mass lesion, ventriculomegaly, extra-axial collection or acute intracranial hemorrhage. Cervicomedullary junction is within normal limits. 5 mm right pituitary cyst (series 9, image 29) with no regional invasion or mass effect. No further imaging evaluation is necessary. This follows ACR consensus guidelines: Management of Incidental Pituitary Findings on CT, MRI and F18-FDG PET: A White Paper of the ACR Incidental Findings Committee. J Am Coll Radiol 2018; 15: 604-54. Wallace Cullens and white matter signal appears largely normal for age throughout the brain. On thin slice coronal images the left hippocampal formation appears asymmetrically small and indistinct (series 9, image 20), and is asymmetrically FLAIR hyperintense on series 10, image 20. No  abnormal diffusion. Other mesial temporal lobe structures appear symmetric and within normal limits. No other encephalomalacia or chronic cerebral blood products identified. No abnormal enhancement identified.  No dural thickening. Vascular: Major intracranial vascular flow voids are preserved. Left anterior clinoid process pneumatization (normal variant). Following contrast the major dural venous sinuses are enhancing and appear to be patent. Skull and upper cervical spine: Visualized bone marrow signal is within normal limits. Negative visible cervical spine. Sinuses/Orbits: Negative. Other: Visible internal auditory structures appear normal. Negative visible scalp and face. IMPRESSION: 1. Asymmetric decreased size and increased FLAIR signal within the left hippocampus suggests mesial temporal sclerosis on that side. Correlation with EEG may be valuable. 2. But no metastatic disease or superimposed intracranial abnormality identified. And otherwise normal for age MRI appearance of the Brain. Electronically Signed   By: Odessa Fleming M.D.   On: 07/21/2023 07:38   CT Head Wo Contrast Result Date:  07/21/2023 CLINICAL DATA:  Seizure EXAM: CT HEAD WITHOUT CONTRAST TECHNIQUE: Contiguous axial images were obtained from the base of the skull through the vertex without intravenous contrast. RADIATION DOSE REDUCTION: This exam was performed according to the departmental dose-optimization program which includes automated exposure control, adjustment of the mA and/or kV according to patient size and/or use of iterative reconstruction technique. COMPARISON:  None Available. FINDINGS: Brain: No evidence of acute infarction, hemorrhage, hydrocephalus, extra-axial collection or mass lesion/mass effect. Vascular: No hyperdense vessel or unexpected calcification. Skull: Normal. Negative for fracture or focal lesion. Sinuses/Orbits: No acute finding. Other: None. IMPRESSION: No acute intracranial abnormality. Electronically Signed   By: Darliss Cheney M.D.   On: 07/21/2023 00:34   DG Chest Port 1 View Result Date: 07/20/2023 CLINICAL DATA:  Seizure EXAM: PORTABLE CHEST 1 VIEW COMPARISON:  08/20/2021 FINDINGS: The heart size and mediastinal contours are within normal limits. Both lungs are clear. The visualized skeletal structures are unremarkable. IMPRESSION: No active disease. Electronically Signed   By: Minerva Fester M.D.   On: 07/20/2023 22:36     Assessment & Plan:  Need for vaccination -     Varicella-zoster vaccine IM  Dyslipidemia, goal LDL below 130- LDL goal achieved. Doing well on the statin  -     CBC with Differential/Platelet; Future -     TSH; Future  Hypertriglyceridemia -     Basic metabolic panel; Future -     CBC with Differential/Platelet; Future  Nonalcoholic steatohepatitis (NASH) -     CBC with Differential/Platelet; Future -     Protime-INR; Future  Chronic hyperglycemia -     Basic metabolic panel; Future -     CBC with Differential/Platelet; Future -     Hemoglobin A1c; Future  Deficiency anemia- H/H are normal now. -     IBC + Ferritin; Future -     Reticulocytes;  Future  Other orders -     UNLABELED     Follow-up: Return in about 3 months (around 12/11/2023).  Sanda Linger, MD

## 2023-09-12 NOTE — Patient Instructions (Signed)

## 2023-09-13 ENCOUNTER — Telehealth: Payer: Self-pay | Admitting: Internal Medicine

## 2023-09-13 LAB — UNLABELED: Test Ordered On Req: 793

## 2023-09-13 NOTE — Telephone Encounter (Signed)
Form from [facility] has been faxed over. Once received I will complete it, have it signed by Dr. Yetta Barre and fax it back. Nothing else further needs to  be done at this time.

## 2023-09-13 NOTE — Telephone Encounter (Unsigned)
Copied from CRM [id number]. Topic: Clinical - Lab/Test Results >> Sep 13, 2023  9:25 AM Andrew Wright wrote: Reason for CRM: Gunnar Fusi from [facility] called in regarding did not put name on specimen its on order but not the sample REF [id number] C  [id number]

## 2023-09-16 DIAGNOSIS — Z23 Encounter for immunization: Secondary | ICD-10-CM | POA: Insufficient documentation

## 2023-10-10 NOTE — Procedures (Signed)
   HISTORY: 63 year old with history of temporal sclerosis, complex partial seizure  TECHNIQUE:  This is a routine 16 channel EEG recording with one channel devoted to a limited EKG recording.  It was performed during wakefulness, drowsiness and asleep.  Hyperventilation and photic stimulation were performed as activating procedures.  There are frequent muscle and movement artifact noted.  Upon maximum arousal, posterior dominant waking rhythm consistent of low amplitude rhythmic alpha range activity. Activities are symmetric over the bilateral posterior derivations and attenuated with eye opening.  Photic stimulation did not alter the tracing.  Hyperventilation produced mild/moderate buildup with higher amplitude and the slower activities noted.  During EEG recording, patient developed drowsiness and no deeper stage of sleep was achieved During EEG recording, there was no epileptiform discharge noted.  EKG demonstrate normal sinus rhythm.  CONCLUSION: This is a technically difficult study due to frequent muscle artifact, low amplitude tracing.  There is no significant abnormality noted. Levert Feinstein, M.D. Ph.D.  [facility] [address] [place], [place] [address] Phone: [phone] Fax:      [phone]

## 2023-10-23 ENCOUNTER — Telehealth: Payer: Self-pay | Admitting: Neurology

## 2023-10-23 MED ORDER — LEVETIRACETAM 750 MG PO TABS: 750.0000 mg | ORAL_TABLET | Freq: Two times a day (BID) | ORAL | 11 refills | Status: DC

## 2023-10-23 NOTE — Telephone Encounter (Signed)
 Call from wife and patient, she reports yesterday he had an absent seizure ( rubs thumbs and fingers together and doesn't respond right away). She also reports increased salivation with lip smacking during these periods. She said he had 1 additional episode this morning. No missed medications, no alcohol use, and no recent illness or infections. Recently finished treatment of prostate cancer and has been doing well. Advised I would send to Dr. Teresa Coombs for review and follow back up with recommendations.

## 2023-10-23 NOTE — Telephone Encounter (Signed)
 Pt called stating his wife asked that he calls because of mini seizure activity (they have referred to the episodes as absences) pt will appear to be at a loss for words while rubbing his fingers together.  When wife ask him about the episode he does not remember.  Please call pt to discuss.

## 2023-10-23 NOTE — Addendum Note (Signed)
 Addended by: Levert Feinstein on: 10/23/2023 04:10 PM   Modules accepted: Orders

## 2023-10-23 NOTE — Telephone Encounter (Signed)
 The description of the event consistent with complex partial seizure, With his history, and abnormal MRI findings,  Increase the Keppra to 750 twice a day  Meds ordered this encounter  Medications   levETIRAcetam (KEPPRA) 750 MG tablet    Sig: Take 1 tablet (750 mg total) by mouth 2 (two) times daily.    Dispense:  60 tablet    Refill:  11

## 2023-10-23 NOTE — Telephone Encounter (Signed)
 Call to patient, no answer. Left message to return call

## 2023-10-23 NOTE — Telephone Encounter (Signed)
 Call to patient and wife, reviewed increased dose of Keppra. Both verbalized understanding. They will let pharmacy know to remove previous Keppra script.

## 2023-11-02 ENCOUNTER — Other Ambulatory Visit: Payer: Self-pay | Admitting: Internal Medicine

## 2023-11-02 DIAGNOSIS — E785 Hyperlipidemia, unspecified: Secondary | ICD-10-CM

## 2024-01-28 ENCOUNTER — Other Ambulatory Visit: Payer: Self-pay | Admitting: Internal Medicine

## 2024-01-28 DIAGNOSIS — E785 Hyperlipidemia, unspecified: Secondary | ICD-10-CM

## 2024-01-29 ENCOUNTER — Other Ambulatory Visit: Payer: Self-pay | Admitting: Internal Medicine

## 2024-01-29 ENCOUNTER — Ambulatory Visit: Payer: 59 | Admitting: Adult Health

## 2024-01-29 DIAGNOSIS — E785 Hyperlipidemia, unspecified: Secondary | ICD-10-CM

## 2024-01-30 NOTE — Progress Notes (Unsigned)
 No chief complaint on file.     ASSESSMENT AND PLAN  Andrew Wright is a 63 y.o. male   Complex partial seizure, onset 06/2023 Left mesial temporal stenosis  Last occurrence ***  Continue Keppra  750 mg twice daily EEG 08/2023 no significant abnormality MRI brain 06/2023 mesial temporal sclerosis  No driving until seizure-free for 6 months    Follow-up in *** or call earlier if needed      DIAGNOSTIC DATA (LABS, IMAGING, TESTING) - I reviewed patient records, labs, notes, testing and imaging myself where available.  EEG 09/05/2023 CONCLUSION: This is a technically difficult study due to frequent muscle artifact, low amplitude tracing.  There is no significant abnormality noted.  MRI brain 07/21/2023 IMPRESSION: 1. Asymmetric decreased size and increased FLAIR signal within the left hippocampus suggests mesial temporal sclerosis on that side. Correlation with EEG may be valuable. 2. But no metastatic disease or superimposed intracranial abnormality identified. And otherwise normal for age MRI appearance of the Brain.      MEDICAL HISTORY:  Update 01/31/2024 JM: Patient returns for 76-month follow-up visit.  Wife to call office back in February reporting "mini seizures" where he is unable to speak and rubs his fingers together, increased elevation and lipsmacking, he is unaware of these episodes occurring.  Reported medication compliance and no other provoking factor.  Dr. Gracie Lav recommended increasing Keppra  to 750 mg twice daily.       Consult visit 07/29/2023 Dr. Gracie Lav: Andrew Wright, is a 63 year old male, accompanied by his wife seen in request by his primary care physician Dr.  Sandra Crouch for evaluation of seizure, initial evaluation was on July 29, 2023  History is obtained from the patient and review of electronic medical records. I personally reviewed pertinent available imaging films in PACS.   PMHx of  HLD Iron Supplement Glaucoma Prostate cancer in  May 2024, s/p radiation  He works as a Social worker, presented to emergency room on July 20, 2023 after witnessed seizure by his wife.  He recently underwent radiation treatment for his prostate cancer, at the night of July 20, 2023, he went to bed early, around 8:30 PM, his wife heard a loud grunting noise, when she check on patient, he was in the bed, tonic-clonic movement, seizing, last about 10 minutes, he was postictal confusion.  Patient have no recall of the event, woke up on the ambulance, then in the emergency room, with bilateral tongue biting, urinary incontinence  In early November 2024, he went to dinner with his friend, suddenly he could not recognize his friends, acting strangely, lasting about 5 minutes  In March 2024, he was visiting his brother, went to bed in the bedroom, woke up in different room, face down closing to the sofa, with bilateral tongue biting, body achy, as if somebody has broke into his house, beating him up,  Wife reported over the past couple years, he had occasionally spells of sudden onset gazed looking in his eyes, swallowing reflex, not responsive, lasting couple minutes, Reviewed MRI of the brain with without contrast, evidence of left mesial temporal lobe sclerosis  Laboratory evaluation showed normal CBC, CMP,  PHYSICAL EXAM:   There were no vitals filed for this visit.    There is no height or weight on file to calculate BMI.  PHYSICAL EXAMNIATION:  Gen: NAD, conversant, well nourised, well groomed                     Cardiovascular: Regular rate  rhythm, no peripheral edema, warm, nontender. Eyes: Conjunctivae clear without exudates or hemorrhage Neck: Supple, no carotid bruits. Pulmonary: Clear to auscultation bilaterally   NEUROLOGICAL EXAM:  MENTAL STATUS: Speech/cognition: Awake, alert, oriented to history taking and casual conversation CRANIAL NERVES: CN II: Visual fields are full to confrontation. Pupils are round  equal and briskly reactive to light. CN III, IV, VI: extraocular movement are normal. No ptosis. CN V: Facial sensation is intact to light touch CN VII: Face is symmetric with normal eye closure  CN VIII: Hearing is normal to causal conversation. CN IX, X: Phonation is normal. CN XI: Head turning and shoulder shrug are intact  MOTOR: There is no pronator drift of out-stretched arms. Muscle bulk and tone are normal. Muscle strength is normal.  REFLEXES: Reflexes are 2+ and symmetric at the biceps, triceps, knees, and ankles. Plantar responses are flexor.  SENSORY: Intact to light touch, pinprick and vibratory sensation are intact in fingers and toes.  COORDINATION: There is no trunk or limb dysmetria noted.  GAIT/STANCE: Posture is normal. Gait is steady with normal steps, base, arm swing, and turning. Heel and toe walking are normal. Tandem gait is normal.  Romberg is absent.  REVIEW OF SYSTEMS:  Full 14 system review of systems performed and notable only for as above All other review of systems were negative.   ALLERGIES: Allergies  Allergen Reactions   Lovaza  [Omega-3-Acid  Ethyl Esters (Fish)] Other (See Comments)    GI upset   Andrew Wright ] Other (See Comments)    GI upset and hx lower gi bleed (hemorrhoids)   Beef-Derived Drug Products Other (See Comments)    GI upset   Hydrocodone  Other (See Comments)    Gave patient hallucinations.    Penicillins Nausea Only    Has patient had a PCN reaction causing immediate rash, facial/tongue/throat swelling, SOB or lightheadedness with hypotension: No Has patient had a PCN reaction causing severe rash involving mucus membranes or skin necrosis: No Has patient had a PCN reaction that required hospitalization No Has patient had a PCN reaction occurring within the last 10 years: No If all of the above answers are "NO", then may proceed with Cephalosporin use.     HOME MEDICATIONS: Current Outpatient Medications  Medication  Sig Dispense Refill   Ferrous Sulfate (BL IRON PO) Take 0.25 tablets by mouth daily.     latanoprost (XALATAN) 0.005 % ophthalmic solution Place 1 drop into both eyes at bedtime.  0   levETIRAcetam  (KEPPRA ) 750 MG tablet Take 1 tablet (750 mg total) by mouth 2 (two) times daily. 60 tablet 11   MAGNESIUM GLYCINATE PO Take 400 mg by mouth daily.     milk thistle 175 MG tablet Take 175 mg by mouth daily. W/  Turmeric and Artichoke     Pyridoxine HCl (VITAMIN B6 PO) Take 1 tablet by mouth daily.     rosuvastatin  (CRESTOR ) 5 MG tablet TAKE 1 TABLET(5 MG) BY MOUTH DAILY. NEEDS APPOINTMENT FOR REFILLS. 30 tablet 0   No current facility-administered medications for this visit.    PAST MEDICAL HISTORY: Past Medical History:  Diagnosis Date   Anxiety    Depression    Glaucoma, both eyes    Hemorrhoids    History of adenomatous polyp of colon    History of kidney stones    ? in 1990s   History of lower GI bleeding    yrs ago due to hemorrhoids   Hyperlipidemia, mixed    Hypogonadism male  IDA (iron deficiency anemia)    Malignant neoplasm prostate [facility]) 01/2023   primary urologist--- dr Secundino Dach  radiation oncologist--- dr Lorri Rota;  dx 06/ 2024, gleason 3+4   Migraine    NASH (nonalcoholic steatohepatitis)    followed by pcp;  last hepatic panel in epic normal 03/ 2024   Nocturia    Precordial catch syndrome 07/2021   ED dx 08-20-2021;   pt previously evaluated by cardiology in epic in 2019 and last visit w/ dr Maximo Spar 01-08-2020  cardiac CT w/ calcium  score zero, no further work-up,   Wears glasses     PAST SURGICAL HISTORY: Past Surgical History:  Procedure Laterality Date   COLONOSCOPY WITH PROPOFOL   08/10/2022   dr Lorain Robson   CYSTOSCOPY     approx 1990s  per pt had removal of blockage that was causing  pain w/ urination   GOLD SEED IMPLANT N/A 05/24/2023   Procedure: GOLD SEED IMPLANT;  Surgeon: Lahoma Pigg, MD;  [facility];  Service: Urology;   Laterality: N/A;   SPACE OAR INSTILLATION N/A 05/24/2023   Procedure: SPACE OAR INSTILLATION;  Surgeon: Lahoma Pigg, MD;  Location: [facility];  Service: Urology;  Laterality: N/A;    FAMILY HISTORY: Family History  Problem Relation Age of Onset   Breast cancer Mother    Stomach cancer Mother    Prostate cancer Father    Skin cancer Sister    Leukemia Brother    Skin cancer Brother    Arthritis Other    Stroke Other    Colon cancer Neg Hx    Rectal cancer Neg Hx    Esophageal cancer Neg Hx    Colon polyps Neg Hx     SOCIAL HISTORY: Social History   Socioeconomic History   Marital status: Married    Spouse name: Not on file   Number of children: Not on file   Years of education: Not on file   Highest education level: Not on file  Occupational History   Not on file  Tobacco Use   Smoking status: Never   Smokeless tobacco: Never  Vaping Use   Vaping status: Never Used  Substance and Sexual Activity   Alcohol use: Not Currently    Comment: 05-16-2023  per pt last alcohol 1980s   Drug use: Never   Sexual activity: Yes  Other Topics Concern   Not on file  Social History Narrative   Caffienated drinks-yes   Seat belt use often-yes   Regular Exercise-yes   Smoke alarm in the home-yes   Firearms/guns in the home-no   History of physical abuse-no                  Social Drivers of Health   Financial Resource Strain: Not on file  Food Insecurity: No Food Insecurity (03/22/2023)   Hunger Vital Sign    Worried About Running Out of Food in the Last Year: Never true    Ran Out of Food in the Last Year: Never true  Transportation Needs: No Transportation Needs (03/22/2023)   PRAPARE - Administrator, Civil Service (Medical): No    Lack of Transportation (Non-Medical): No  Physical Activity: Not on file  Stress: Not on file  Social Connections: Unknown (01/09/2022)   [facility]   Social Network     Social Network: Not on file  Intimate Partner Violence: Not At Risk (03/22/2023)   Humiliation, Afraid, Rape,  and Kick questionnaire    Fear of Current or Ex-Partner: No    Emotionally Abused: No    Physically Abused: No    Sexually Abused: No      I personally spent a total of *** minutes in the care of the patient today including {Time Based Coding:210964241}.  Johny Nap, AGNP-BC  [facility] [address] [place], [place] [address]  Phone [phone] Fax [phone] Note: This document was prepared with digital dictation and possible smart phrase technology. Any transcriptional errors that result from this process are unintentional.

## 2024-01-31 ENCOUNTER — Ambulatory Visit: Admitting: Adult Health

## 2024-01-31 ENCOUNTER — Encounter: Payer: Self-pay | Admitting: Adult Health

## 2024-01-31 VITALS — BP 128/86 | HR 78 | Ht 75.0 in | Wt 208.0 lb

## 2024-01-31 DIAGNOSIS — G9381 Temporal sclerosis: Secondary | ICD-10-CM | POA: Diagnosis not present

## 2024-01-31 DIAGNOSIS — G40209 Localization-related (focal) (partial) symptomatic epilepsy and epileptic syndromes with complex partial seizures, not intractable, without status epilepticus: Secondary | ICD-10-CM

## 2024-01-31 MED ORDER — LEVETIRACETAM 750 MG PO TABS: 750.0000 mg | ORAL_TABLET | Freq: Two times a day (BID) | ORAL | 4 refills | Status: DC

## 2024-01-31 NOTE — Patient Instructions (Addendum)
 Your Plan:  Continue Keppra  750 mg twice daily for seizure prevention  Please call with any additional seizure activity  As long as you remain seizure-free, you can return back to driving in August     Follow up in 6 months or call earlier if needed      Thank you for coming to see us  at [facility]. I hope we have been able to provide you high quality care today.  You may receive a patient satisfaction survey over the next few weeks. We would appreciate your feedback and comments so that we may continue to improve ourselves and the health of our patients.

## 2024-01-31 NOTE — Addendum Note (Signed)
 Addended by: Johny Nap L on: 01/31/2024 09:54 AM   Modules accepted: Level of Service

## 2024-02-23 ENCOUNTER — Emergency Department (HOSPITAL_COMMUNITY)
Admission: EM | Admit: 2024-02-23 | Discharge: 2024-02-24 | Disposition: A | Discharge status: Home / Self Care | Attending: Emergency Medicine | Admitting: Emergency Medicine

## 2024-02-23 ENCOUNTER — Other Ambulatory Visit: Payer: Self-pay

## 2024-02-23 ENCOUNTER — Encounter (HOSPITAL_COMMUNITY): Payer: Self-pay

## 2024-02-23 DIAGNOSIS — Z23 Encounter for immunization: Secondary | ICD-10-CM | POA: Insufficient documentation

## 2024-02-23 DIAGNOSIS — X58XXXA Exposure to other specified factors, initial encounter: Secondary | ICD-10-CM | POA: Insufficient documentation

## 2024-02-23 DIAGNOSIS — S91031A Puncture wound without foreign body, right ankle, initial encounter: Secondary | ICD-10-CM | POA: Insufficient documentation

## 2024-02-23 DIAGNOSIS — T148XXA Other injury of unspecified body region, initial encounter: Secondary | ICD-10-CM

## 2024-02-23 NOTE — ED Triage Notes (Signed)
 Pt came in via POV d/t feeling a horrible stinging in his inner Rt ankle when he was bringing his trash to the road. States he isn't sure if it was a wasp or a snake. Pt does have 2 red spots approx 1 inch apart to that area with some redness/swelling. States he is very anxious & there is some pain when he walks & it still stings. A/Ox4, rates his pain intermittently 2/10 during triage.

## 2024-02-24 ENCOUNTER — Telehealth: Payer: Self-pay

## 2024-02-24 LAB — BASIC METABOLIC PANEL WITH GFR
Anion gap: 12 (ref 5–15)
BUN: 13 mg/dL (ref 8–23)
CO2: 25 mmol/L (ref 22–32)
Calcium: 9.4 mg/dL (ref 8.9–10.3)
Chloride: 104 mmol/L (ref 98–111)
Creatinine, Ser: 0.87 mg/dL (ref 0.61–1.24)
GFR, Estimated: >60 mL/min (ref >60)
Glucose, Bld: 99 mg/dL (ref 70–99)
Potassium: 4.3 mmol/L (ref 3.5–5.1)
Sodium: 141 mmol/L (ref 135–145)

## 2024-02-24 LAB — CBC WITH DIFFERENTIAL/PLATELET
Abs Immature Granulocytes: 0.02 10*3/uL (ref 0.00–0.07)
Basophils Absolute: 0 10*3/uL (ref 0.0–0.1)
Basophils Relative: 0 %
Eosinophils Absolute: 0.1 10*3/uL (ref 0.0–0.5)
Eosinophils Relative: 3 %
HCT: 46.8 % (ref 39.0–52.0)
Hemoglobin: 16.1 g/dL (ref 13.0–17.0)
Immature Granulocytes: 0 %
Lymphocytes Relative: 30 %
Lymphs Abs: 1.4 10*3/uL (ref 0.7–4.0)
MCH: 32 pg (ref 26.0–34.0)
MCHC: 34.4 g/dL (ref 30.0–36.0)
MCV: 93 fL (ref 80.0–100.0)
Monocytes Absolute: 0.5 10*3/uL (ref 0.1–1.0)
Monocytes Relative: 10 %
Neutro Abs: 2.7 10*3/uL (ref 1.7–7.7)
Neutrophils Relative %: 57 %
Platelets: 194 10*3/uL (ref 150–400)
RBC: 5.03 MIL/uL (ref 4.22–5.81)
RDW: 12.2 % (ref 11.5–15.5)
WBC: 4.8 10*3/uL (ref 4.0–10.5)
nRBC: 0 % (ref 0.0–0.2)

## 2024-02-24 LAB — PROTIME-INR
INR: 0.9 (ref 0.8–1.2)
Prothrombin Time: 13.2 s (ref 11.4–15.2)

## 2024-02-24 LAB — FIBRINOGEN: Fibrinogen: 300 mg/dL (ref 210–475)

## 2024-02-24 MED ORDER — LORATADINE 10 MG PO TABS: 10.0000 mg | ORAL_TABLET | Freq: Every day | ORAL | 0 refills | Status: DC

## 2024-02-24 MED ORDER — LORATADINE 10 MG PO TABS
10.0000 mg | ORAL_TABLET | Freq: Once | ORAL | Status: AC
  Administered 2024-02-24: 10 mg via ORAL
  Filled 2024-02-24: qty 1

## 2024-02-24 MED ORDER — FAMOTIDINE 20 MG PO TABS: 20.0000 mg | ORAL_TABLET | Freq: Two times a day (BID) | ORAL | 0 refills | Status: DC

## 2024-02-24 MED ORDER — TETANUS-DIPHTH-ACELL PERTUSSIS 5-2.5-18.5 LF-MCG/0.5 IM SUSY
0.5000 mL | PREFILLED_SYRINGE | Freq: Once | INTRAMUSCULAR | Status: AC
  Administered 2024-02-24: 0.5 mL via INTRAMUSCULAR
  Filled 2024-02-24: qty 0.5

## 2024-02-24 MED ORDER — FAMOTIDINE 20 MG PO TABS
20.0000 mg | ORAL_TABLET | Freq: Once | ORAL | Status: AC
  Administered 2024-02-24: 20 mg via ORAL
  Filled 2024-02-24: qty 1

## 2024-02-24 NOTE — Transitions of Care (Post Inpatient/ED Visit) (Signed)
   02/24/2024  Name: Andrew Wright MRN: [id number] DOB: [birth year]  Today's TOC FU Call Status: Today's TOC FU Call Status:: Unsuccessful Call (1st Attempt) Unsuccessful Call (1st Attempt) Date: 02/24/24  Attempted to reach the patient regarding the most recent Inpatient/ED visit.  Follow Up Plan: Additional outreach attempts will be made to reach the patient to complete the Transitions of Care (Post Inpatient/ED visit) call.   Signature: Chiquita, CMA

## 2024-02-24 NOTE — ED Provider Notes (Signed)
 [place] EMERGENCY DEPARTMENT AT [facility] Provider Note   CSN: [id number] Arrival date & time: 02/23/24  1759     Patient presents with: Possible Snake Bite   Andrew Wright is a 63 y.o. male.   The history is provided by the patient.  Wound Check This is a new problem. The current episode started 6 to 12 hours ago. The problem occurs constantly. The problem has not changed since onset.Pertinent negatives include no chest pain, no abdominal pain, no headaches and no shortness of breath. Nothing aggravates the symptoms. Nothing relieves the symptoms. He has tried nothing for the symptoms. The treatment provided no relief.  Patient reports stinging sensation to the right medial ankle while taking out trash.  Did not see a snake or insect but noted 2 puncture wounds.  Stinging sensation in the area.       Prior to Admission medications   Medication Sig Start Date End Date Taking? Authorizing Provider  famotidine (PEPCID) 20 MG tablet Take 1 tablet (20 mg total) by mouth 2 (two) times daily. 02/24/24  Yes Alliyah Roesler, MD  loratadine (CLARITIN) 10 MG tablet Take 1 tablet (10 mg total) by mouth daily. 02/24/24  Yes Mang Hazelrigg, MD  Ferrous Sulfate (BL IRON PO) Take 0.25 tablets by mouth daily.    [provider]  latanoprost (XALATAN) 0.005 % ophthalmic solution Place 1 drop into both eyes at bedtime. 01/10/17   [provider]  levETIRAcetam  (KEPPRA ) 750 MG tablet Take 1 tablet (750 mg total) by mouth 2 (two) times daily. 01/31/24   Whitfield Raisin, NP  MAGNESIUM GLYCINATE PO Take 400 mg by mouth daily.    [provider]  milk thistle 175 MG tablet Take 175 mg by mouth daily. W/  Turmeric and Artichoke    [provider]  Pyridoxine HCl (VITAMIN B6 PO) Take 1 tablet by mouth daily. Patient not taking: Reported on 01/31/2024    [provider]  rosuvastatin  (CRESTOR ) 5 MG tablet TAKE 1 TABLET(5 MG) BY MOUTH DAILY. NEEDS APPOINTMENT  FOR REFILLS. 01/29/24   Joshua Debby CROME, MD    Allergies: Lovaza  [omega-3-acid  ethyl esters (fish)], Asa [aspirin ], Beef-derived drug products, Hydrocodone , and Penicillins    Review of Systems  Respiratory:  Negative for shortness of breath.   Cardiovascular:  Negative for chest pain.  Gastrointestinal:  Negative for abdominal pain.  Skin:  Positive for wound.  Neurological:  Negative for headaches.  All other systems reviewed and are negative.   Updated Vital Signs BP (!) 129/90 (BP Location: Left Arm)   Pulse 73   Temp 98.1 F (36.7 C) (Oral)   Resp 16   Ht 6' 3 (1.905 m)   Wt 95.7 kg   SpO2 100%   BMI 26.37 kg/m   Physical Exam Vitals and nursing note reviewed.  Constitutional:      General: He is not in acute distress.    Appearance: Normal appearance. He is well-developed. He is not diaphoretic.  HENT:     Head: Normocephalic and atraumatic.     Nose: Nose normal.   Eyes:     Conjunctiva/sclera: Conjunctivae normal.     Pupils: Pupils are equal, round, and reactive to light.    Cardiovascular:     Rate and Rhythm: Normal rate and regular rhythm.  Pulmonary:     Effort: Pulmonary effort is normal.     Breath sounds: Normal breath sounds. No wheezing or rales.  Abdominal:     General:  Bowel sounds are normal.     Palpations: Abdomen is soft.     Tenderness: There is no abdominal tenderness. There is no guarding or rebound.   Musculoskeletal:        General: Normal range of motion.     Cervical back: Normal range of motion and neck supple.       Feet:   Skin:    General: Skin is warm and dry.     Capillary Refill: Capillary refill takes less than 2 seconds.   Neurological:     General: No focal deficit present.     Mental Status: He is alert and oriented to person, place, and time.     (all labs ordered are listed, but only abnormal results are displayed) Results for orders placed or performed during the hospital encounter of 02/23/24  Basic  metabolic panel   Collection Time: 02/24/24 12:10 AM  Result Value Ref Range   Sodium 141 135 - 145 mmol/L   Potassium 4.3 3.5 - 5.1 mmol/L   Chloride 104 98 - 111 mmol/L   CO2 25 22 - 32 mmol/L   Glucose, Bld 99 70 - 99 mg/dL   BUN 13 8 - 23 mg/dL   Creatinine, Ser 9.12 0.61 - 1.24 mg/dL   Calcium  9.4 8.9 - 10.3 mg/dL   GFR, Estimated >39 >39 mL/min   Anion gap 12 5 - 15  CBC WITH DIFFERENTIAL   Collection Time: 02/24/24 12:10 AM  Result Value Ref Range   WBC 4.8 4.0 - 10.5 K/uL   RBC 5.03 4.22 - 5.81 MIL/uL   Hemoglobin 16.1 13.0 - 17.0 g/dL   HCT 53.1 60.9 - 47.9 %   MCV 93.0 80.0 - 100.0 fL   MCH 32.0 26.0 - 34.0 pg   MCHC 34.4 30.0 - 36.0 g/dL   RDW 87.7 88.4 - 84.4 %   Platelets 194 150 - 400 K/uL   nRBC 0.0 0.0 - 0.2 %   Neutrophils Relative % 57 %   Neutro Abs 2.7 1.7 - 7.7 K/uL   Lymphocytes Relative 30 %   Lymphs Abs 1.4 0.7 - 4.0 K/uL   Monocytes Relative 10 %   Monocytes Absolute 0.5 0.1 - 1.0 K/uL   Eosinophils Relative 3 %   Eosinophils Absolute 0.1 0.0 - 0.5 K/uL   Basophils Relative 0 %   Basophils Absolute 0.0 0.0 - 0.1 K/uL   Immature Granulocytes 0 %   Abs Immature Granulocytes 0.02 0.00 - 0.07 K/uL  Protime-INR   Collection Time: 02/24/24 12:10 AM  Result Value Ref Range   Prothrombin Time 13.2 11.4 - 15.2 seconds   INR 0.9 0.8 - 1.2  Fibrinogen   Collection Time: 02/24/24  1:41 AM  Result Value Ref Range   Fibrinogen 300 210 - 475 mg/dL   No results found.   Radiology: No results found.   Procedures   Medications Ordered in the ED  Tdap (BOOSTRIX) injection 0.5 mL (0.5 mLs Intramuscular Given 02/24/24 0156)  loratadine (CLARITIN) tablet 10 mg (10 mg Oral Given 02/24/24 0231)  famotidine (PEPCID) tablet 20 mg (20 mg Oral Given 02/24/24 0231)                                    Medical Decision Making Patient with puncture wounds to medial right ankle unsure if stung by an insect or bitten by a snake, did not see  Amount and/or  Complexity of Data Reviewed External Data Reviewed: notes.    Details: Previous notes reviewed  Labs: ordered.    Details: Normal sodium 141, normal potassium 4.3, normal creatinine 0.87. normal white count 4.8, normal hemoglobin.  Normal coagulation studies.  Fibrinogen 300 normal  Discussion of management or test interpretation with external provider(s): Case d/w Poison control.  Does not need additional labs.  This does not sound like snake bite based on radius and labs.    Risk OTC drugs. Prescription drug management. Risk Details: Observed in the ED.  No tracking.  Stable for discharge.  Strict return.  Will start pepcid and claritin as anti-histamines for stinging sensation in ankle.       Final diagnoses:  Puncture wound   No signs of systemic illness or infection. The patient is nontoxic-appearing on exam and vital signs are within normal limits.  I have reviewed the triage vital signs and the nursing notes. Pertinent labs & imaging results that were available during my care of the patient were reviewed by me and considered in my medical decision making (see chart for details). After history, exam, and medical workup I feel the patient has been appropriately medically screened and is safe for discharge home. Pertinent diagnoses were discussed with the patient. Patient was given return precautions.  ED Discharge Orders          Ordered    loratadine (CLARITIN) 10 MG tablet  Daily        02/24/24 0412    famotidine (PEPCID) 20 MG tablet  2 times daily        02/24/24 0412               Sahid Borba, MD 02/24/24 [id number]

## 2024-02-25 ENCOUNTER — Other Ambulatory Visit: Payer: Self-pay | Admitting: Internal Medicine

## 2024-02-25 DIAGNOSIS — E785 Hyperlipidemia, unspecified: Secondary | ICD-10-CM

## 2024-02-26 ENCOUNTER — Other Ambulatory Visit: Payer: Self-pay | Admitting: Internal Medicine

## 2024-02-26 DIAGNOSIS — E785 Hyperlipidemia, unspecified: Secondary | ICD-10-CM

## 2024-03-30 ENCOUNTER — Other Ambulatory Visit: Payer: Self-pay | Admitting: Internal Medicine

## 2024-03-30 DIAGNOSIS — E785 Hyperlipidemia, unspecified: Secondary | ICD-10-CM

## 2024-03-31 LAB — PSA: PSA: 1.8

## 2024-04-01 ENCOUNTER — Other Ambulatory Visit: Payer: Self-pay | Admitting: Internal Medicine

## 2024-04-01 DIAGNOSIS — E785 Hyperlipidemia, unspecified: Secondary | ICD-10-CM

## 2024-04-01 NOTE — Telephone Encounter (Signed)
 Copied from CRM [id number]. Topic: Clinical - Medication Refill >> Apr 01, 2024  4:26 PM Tiffany S wrote: Medication: rosuvastatin  (CRESTOR ) 5 MG tablet [[id number]]  Has the patient contacted their pharmacy? Yes (Agent: If no, request that the patient contact the pharmacy for the refill. If patient does not wish to contact the pharmacy document the reason why and proceed with request.) (Agent: If yes, when and what did the pharmacy advise?)  This is the patient's preferred pharmacy:  [id number] - [place], [place] - [address] [facility] [address] [facility] [address] [place] [address] Phone: [phone] Fax: [phone]  Is this the correct pharmacy for this prescription? Yes If no, delete pharmacy and type the correct one.   Has the prescription been filled recently? Yes  Is the patient out of the medication? Yes  Has the patient been seen for an appointment in the last year OR does the patient have an upcoming appointment? Yes  Can we respond through MyChart? Yes  Agent: Please be advised that Rx refills may take up to 3 business days. We ask that you follow-up with your pharmacy.

## 2024-04-01 NOTE — Telephone Encounter (Signed)
 Last OV 09/12/23, f/u 3 months Next OV not scheduled   Last refill 02/26/24 Qty #30/0  Pt was due for f/u visit in April, please reach out to assist with scheduling.

## 2024-04-07 ENCOUNTER — Ambulatory Visit (INDEPENDENT_AMBULATORY_CARE_PROVIDER_SITE_OTHER): Admitting: Internal Medicine

## 2024-04-07 ENCOUNTER — Ambulatory Visit: Payer: Self-pay | Admitting: Internal Medicine

## 2024-04-07 ENCOUNTER — Encounter: Payer: Self-pay | Admitting: Internal Medicine

## 2024-04-07 VITALS — BP 148/94 | HR 73 | Temp 98.2°F | Resp 16 | Ht 75.0 in | Wt 212.0 lb

## 2024-04-07 DIAGNOSIS — K7581 Nonalcoholic steatohepatitis (NASH): Secondary | ICD-10-CM

## 2024-04-07 DIAGNOSIS — Z0001 Encounter for general adult medical examination with abnormal findings: Secondary | ICD-10-CM | POA: Insufficient documentation

## 2024-04-07 DIAGNOSIS — I10 Essential (primary) hypertension: Secondary | ICD-10-CM | POA: Diagnosis not present

## 2024-04-07 DIAGNOSIS — Z0000 Encounter for general adult medical examination without abnormal findings: Secondary | ICD-10-CM

## 2024-04-07 DIAGNOSIS — E785 Hyperlipidemia, unspecified: Secondary | ICD-10-CM | POA: Diagnosis not present

## 2024-04-07 DIAGNOSIS — Z23 Encounter for immunization: Secondary | ICD-10-CM | POA: Diagnosis not present

## 2024-04-07 DIAGNOSIS — E781 Pure hyperglyceridemia: Secondary | ICD-10-CM

## 2024-04-07 DIAGNOSIS — I119 Hypertensive heart disease without heart failure: Secondary | ICD-10-CM | POA: Insufficient documentation

## 2024-04-07 LAB — LIPID PANEL
Cholesterol: 151 mg/dL (ref 0–200)
HDL: 37.1 mg/dL — ABNORMAL LOW (ref >39.00)
LDL Cholesterol: 39 mg/dL (ref 0–99)
NonHDL: 113.79
Total CHOL/HDL Ratio: 4
Triglycerides: 373 mg/dL — ABNORMAL HIGH (ref 0.0–149.0)
VLDL: 74.6 mg/dL — ABNORMAL HIGH (ref 0.0–40.0)

## 2024-04-07 LAB — HEPATIC FUNCTION PANEL
ALT: 85 U/L — ABNORMAL HIGH (ref 0–53)
AST: 47 U/L — ABNORMAL HIGH (ref 0–37)
Albumin: 4.8 g/dL (ref 3.5–5.2)
Alkaline Phosphatase: 72 U/L (ref 39–117)
Bilirubin, Direct: 0.2 mg/dL (ref 0.0–0.3)
Total Bilirubin: 0.8 mg/dL (ref 0.2–1.2)
Total Protein: 7.2 g/dL (ref 6.0–8.3)

## 2024-04-07 MED ORDER — ROSUVASTATIN CALCIUM 5 MG PO TABS: 5.0000 mg | ORAL_TABLET | Freq: Every day | ORAL | 1 refills | Status: DC

## 2024-04-07 MED ORDER — OLMESARTAN MEDOXOMIL 20 MG PO TABS: 20.0000 mg | ORAL_TABLET | Freq: Every day | ORAL | 1 refills | Status: DC

## 2024-04-07 NOTE — Progress Notes (Signed)
 Subjective:  Patient ID: Andrew Wright, male    DOB: 11-15-1960  Age: 63 y.o. MRN: [id number]  CC: Annual Exam, Hyperlipidemia, and Hypertension   HPI Andrew Wright presents for a CPX and f/up -  Discussed the use of AI scribe software for clinical note transcription with the patient, who gave verbal consent to proceed.  History of Present Illness Andrew Wright is a 63 year old male who presents for a statin prescription refill.  He is here because his pharmacy will not refill his statin prescription until he sees the doctor. He has three pills left and has been taking the statin regularly without experiencing muscle aches.  He has a history of seizure disorder and is currently on medication for seizure control. No recent seizures have occurred. He denies symptoms of high blood pressure such as headache or blurred vision and is not on any antihypertensive medication. No chest pain or shortness of breath during physical activity, including walking his dog twice a day.  He recently had blood work done at the [facility], including a PSA test, which was 1.8, improved from 8 last year. He has also had a urinalysis done recently and has requested that these results be sent to the current provider.  In terms of physical activity, he has started doing 'old man chair yoga' daily for the past two weeks and is back to walking regularly. He enjoys these activities and reports feeling good without any symptoms during exertion.  He recalls receiving a tetanus shot recently due to a suspected snake bite, which was later thought to be a bee or wasp sting. He is unsure about his pneumonia vaccination status. No changes in moles or new skin lesions. No known family history of high blood pressure.    Outpatient Medications Prior to Visit  Medication Sig Dispense Refill   Ferrous Sulfate (BL IRON PO) Take 0.25 tablets by mouth daily.     latanoprost (XALATAN) 0.005 % ophthalmic solution  Place 1 drop into both eyes at bedtime.  0   levETIRAcetam  (KEPPRA ) 750 MG tablet Take 1 tablet (750 mg total) by mouth 2 (two) times daily. 180 tablet 4   MAGNESIUM GLYCINATE PO Take 400 mg by mouth daily.     milk thistle 175 MG tablet Take 175 mg by mouth daily. W/  Turmeric and Artichoke     Pyridoxine HCl (VITAMIN B6 PO) Take 1 tablet by mouth daily.     rosuvastatin  (CRESTOR ) 5 MG tablet TAKE 1 TABLET(5 MG) BY MOUTH DAILY. Patient to follow up with PCP prior to future refills 30 tablet 0   famotidine  (PEPCID ) 20 MG tablet Take 1 tablet (20 mg total) by mouth 2 (two) times daily. 20 tablet 0   loratadine  (CLARITIN ) 10 MG tablet Take 1 tablet (10 mg total) by mouth daily. 10 tablet 0   No facility-administered medications prior to visit.    ROS Review of Systems  Constitutional: Negative.  Negative for appetite change, chills, diaphoresis, fatigue and fever.  HENT: Negative.    Eyes: Negative.   Respiratory: Negative.  Negative for cough, chest tightness, shortness of breath and wheezing.   Cardiovascular:  Negative for chest pain, palpitations and leg swelling.  Gastrointestinal:  Negative for abdominal pain, constipation, diarrhea, nausea and vomiting.  Endocrine: Negative.   Genitourinary: Negative.  Negative for difficulty urinating, dysuria and hematuria.  Musculoskeletal: Negative.  Negative for myalgias.  Skin: Negative.   Neurological:  Negative for dizziness, weakness, light-headedness and numbness.  Hematological:  Negative for adenopathy. Does not bruise/bleed easily.  Psychiatric/Behavioral: Negative.      Objective:  BP (!) 148/94 (BP Location: Left Arm, Patient Position: Sitting, Cuff Size: Normal) Comment: BP (R) 146/94  Pulse 73   Temp 98.2 F (36.8 C) (Oral)   Resp 16   Ht 6' 3 (1.905 m)   Wt 212 lb (96.2 kg)   SpO2 98%   BMI 26.50 kg/m   BP Readings from Last 3 Encounters:  04/07/24 (!) 148/94  02/24/24 128/87  01/31/24 128/86    Wt Readings from  Last 3 Encounters:  04/07/24 212 lb (96.2 kg)  02/23/24 211 lb (95.7 kg)  01/31/24 208 lb (94.3 kg)    Physical Exam Vitals reviewed.  Constitutional:      Appearance: Normal appearance.  HENT:     Mouth/Throat:     Mouth: Mucous membranes are moist.  Eyes:     General: No scleral icterus.    Conjunctiva/sclera: Conjunctivae normal.  Cardiovascular:     Rate and Rhythm: Normal rate and regular rhythm.     Heart sounds: Normal heart sounds, S1 normal and S2 normal. No murmur heard.    Comments: EKG--- NSR, 69 bpm Minimal LVH No Q waves Unchanged  Pulmonary:     Breath sounds: No stridor. No wheezing, rhonchi or rales.  Abdominal:     General: Abdomen is flat.     Palpations: There is no mass.     Tenderness: There is no abdominal tenderness. There is no guarding.     Hernia: No hernia is present.  Musculoskeletal:        General: Normal range of motion.     Cervical back: Neck supple.     Right lower leg: No edema.     Left lower leg: No edema.  Skin:    General: Skin is warm and dry.  Neurological:     General: No focal deficit present.     Mental Status: He is alert. Mental status is at baseline.  Psychiatric:        Mood and Affect: Mood normal.        Behavior: Behavior normal.     Lab Results  Component Value Date   WBC 4.8 02/24/2024   HGB 16.1 02/24/2024   HCT 46.8 02/24/2024   PLT 194 02/24/2024   GLUCOSE 99 02/24/2024   CHOL 151 04/07/2024   TRIG 373.0 (H) 04/07/2024   HDL 37.10 (L) 04/07/2024   LDLDIRECT 82.0 11/12/2022   LDLCALC 39 04/07/2024   ALT 85 (H) 04/07/2024   AST 47 (H) 04/07/2024   NA 141 02/24/2024   K 4.3 02/24/2024   CL 104 02/24/2024   CREATININE 0.87 02/24/2024   BUN 13 02/24/2024   CO2 25 02/24/2024   TSH 2.31 09/12/2023   PSA 1.8 03/31/2024   INR 0.9 02/24/2024   HGBA1C 5.5 09/12/2023    Fibrosis 4 Score = 1.68 (Indeterminate)       Interpretation for patients with NAFLD          <1.30       -  F0-F1 (Low risk)           1.30-2.67 -  Indeterminate           >2.67      -  F3-F4 (High risk)     Validated for ages 17-65         Assessment & Plan:  Nonalcoholic steatohepatitis (NASH) -     Hepatic  function panel; Future  Hypertriglyceridemia -     Lipid panel; Future  Dyslipidemia, goal LDL below 130- LDL goal achieved. Doing well on the statin. -     Lipid panel; Future -     Hepatic function panel; Future -     Rosuvastatin  Calcium ; Take 1 tablet (5 mg total) by mouth daily.  Dispense: 90 tablet; Refill: 1  Encounter for general adult medical examination with abnormal findings- Exam completed, labs reviewed, vaccines reviewed and updated, cancer screenings are UTD, pt ed material was given.   Primary hypertension- There is LVH on the EKG. Will start an ARB. -     EKG 12-Lead  Immunization due -     Pneumococcal conjugate vaccine 20-valent  LVH (left ventricular hypertrophy) due to hypertensive disease, without heart failure -     Olmesartan  Medoxomil; Take 1 tablet (20 mg total) by mouth daily.  Dispense: 90 tablet; Refill: 1  Other orders -     EKG     Follow-up: Return in about 6 months (around 10/08/2024).  Debby Molt, MD

## 2024-04-07 NOTE — Patient Instructions (Signed)
 Health Maintenance, Male  Adopting a healthy lifestyle and getting preventive care are important in promoting health and wellness. Ask your health care provider about:  The right schedule for you to have regular tests and exams.  Things you can do on your own to prevent diseases and keep yourself healthy.  What should I know about diet, weight, and exercise?  Eat a healthy diet    Eat a diet that includes plenty of vegetables, fruits, low-fat dairy products, and lean protein.  Do not eat a lot of foods that are high in solid fats, added sugars, or sodium.  Maintain a healthy weight  Body mass index (BMI) is a measurement that can be used to identify possible weight problems. It estimates body fat based on height and weight. Your health care provider can help determine your BMI and help you achieve or maintain a healthy weight.  Get regular exercise  Get regular exercise. This is one of the most important things you can do for your health. Most adults should:  Exercise for at least 150 minutes each week. The exercise should increase your heart rate and make you sweat (moderate-intensity exercise).  Do strengthening exercises at least twice a week. This is in addition to the moderate-intensity exercise.  Spend less time sitting. Even light physical activity can be beneficial.  Watch cholesterol and blood lipids  Have your blood tested for lipids and cholesterol at 64 years of age, then have this test every 5 years.  You may need to have your cholesterol levels checked more often if:  Your lipid or cholesterol levels are high.  You are older than 64 years of age.  You are at high risk for heart disease.  What should I know about cancer screening?  Many types of cancers can be detected early and may often be prevented. Depending on your health history and family history, you may need to have cancer screening at various ages. This may include screening for:  Colorectal cancer.  Prostate cancer.  Skin cancer.  Lung  cancer.  What should I know about heart disease, diabetes, and high blood pressure?  Blood pressure and heart disease  High blood pressure causes heart disease and increases the risk of stroke. This is more likely to develop in people who have high blood pressure readings or are overweight.  Talk with your health care provider about your target blood pressure readings.  Have your blood pressure checked:  Every 3-5 years if you are 9-95 years of age.  Every year if you are 85 years old or older.  If you are between the ages of 29 and 29 and are a current or former smoker, ask your health care provider if you should have a one-time screening for abdominal aortic aneurysm (AAA).  Diabetes  Have regular diabetes screenings. This checks your fasting blood sugar level. Have the screening done:  Once every three years after age 23 if you are at a normal weight and have a low risk for diabetes.  More often and at a younger age if you are overweight or have a high risk for diabetes.  What should I know about preventing infection?  Hepatitis B  If you have a higher risk for hepatitis B, you should be screened for this virus. Talk with your health care provider to find out if you are at risk for hepatitis B infection.  Hepatitis C  Blood testing is recommended for:  Everyone born from [birth year] through 1966.  Anyone  with known risk factors for hepatitis C.  Sexually transmitted infections (STIs)  You should be screened each year for STIs, including gonorrhea and chlamydia, if:  You are sexually active and are younger than 64 years of age.  You are older than 64 years of age and your health care provider tells you that you are at risk for this type of infection.  Your sexual activity has changed since you were last screened, and you are at increased risk for chlamydia or gonorrhea. Ask your health care provider if you are at risk.  Ask your health care provider about whether you are at high risk for HIV. Your health care provider  may recommend a prescription medicine to help prevent HIV infection. If you choose to take medicine to prevent HIV, you should first get tested for HIV. You should then be tested every 3 months for as long as you are taking the medicine.  Follow these instructions at home:  Alcohol use  Do not drink alcohol if your health care provider tells you not to drink.  If you drink alcohol:  Limit how much you have to 0-2 drinks a day.  Know how much alcohol is in your drink. In the [place] one drink equals one 12 oz bottle of beer (355 mL), one 5 oz glass of wine (148 mL), or one 1 oz glass of hard liquor (44 mL).  Lifestyle  Do not use any products that contain nicotine or tobacco. These products include cigarettes, chewing tobacco, and vaping devices, such as e-cigarettes. If you need help quitting, ask your health care provider.  Do not use street drugs.  Do not share needles.  Ask your health care provider for help if you need support or information about quitting drugs.  General instructions  Schedule regular health, dental, and eye exams.  Stay current with your vaccines.  Tell your health care provider if:  You often feel depressed.  You have ever been abused or do not feel safe at home.  Summary  Adopting a healthy lifestyle and getting preventive care are important in promoting health and wellness.  Follow your health care provider's instructions about healthy diet, exercising, and getting tested or screened for diseases.  Follow your health care provider's instructions on monitoring your cholesterol and blood pressure.  This information is not intended to replace advice given to you by your health care provider. Make sure you discuss any questions you have with your health care provider.  Document Revised: 01/02/2021 Document Reviewed: 01/02/2021  Elsevier Patient Education  2024 [employer].

## 2024-04-07 NOTE — Patient Instructions (Signed)
 Health Maintenance, Male  Adopting a healthy lifestyle and getting preventive care are important in promoting health and wellness. Ask your health care provider about:  The right schedule for you to have regular tests and exams.  Things you can do on your own to prevent diseases and keep yourself healthy.  What should I know about diet, weight, and exercise?  Eat a healthy diet    Eat a diet that includes plenty of vegetables, fruits, low-fat dairy products, and lean protein.  Do not eat a lot of foods that are high in solid fats, added sugars, or sodium.  Maintain a healthy weight  Body mass index (BMI) is a measurement that can be used to identify possible weight problems. It estimates body fat based on height and weight. Your health care provider can help determine your BMI and help you achieve or maintain a healthy weight.  Get regular exercise  Get regular exercise. This is one of the most important things you can do for your health. Most adults should:  Exercise for at least 150 minutes each week. The exercise should increase your heart rate and make you sweat (moderate-intensity exercise).  Do strengthening exercises at least twice a week. This is in addition to the moderate-intensity exercise.  Spend less time sitting. Even light physical activity can be beneficial.  Watch cholesterol and blood lipids  Have your blood tested for lipids and cholesterol at 64 years of age, then have this test every 5 years.  You may need to have your cholesterol levels checked more often if:  Your lipid or cholesterol levels are high.  You are older than 64 years of age.  You are at high risk for heart disease.  What should I know about cancer screening?  Many types of cancers can be detected early and may often be prevented. Depending on your health history and family history, you may need to have cancer screening at various ages. This may include screening for:  Colorectal cancer.  Prostate cancer.  Skin cancer.  Lung  cancer.  What should I know about heart disease, diabetes, and high blood pressure?  Blood pressure and heart disease  High blood pressure causes heart disease and increases the risk of stroke. This is more likely to develop in people who have high blood pressure readings or are overweight.  Talk with your health care provider about your target blood pressure readings.  Have your blood pressure checked:  Every 3-5 years if you are 24-52 years of age.  Every year if you are 3 years old or older.  If you are between the ages of 60 and 72 and are a current or former smoker, ask your health care provider if you should have a one-time screening for abdominal aortic aneurysm (AAA).  Diabetes  Have regular diabetes screenings. This checks your fasting blood sugar level. Have the screening done:  Once every three years after age 66 if you are at a normal weight and have a low risk for diabetes.  More often and at a younger age if you are overweight or have a high risk for diabetes.  What should I know about preventing infection?  Hepatitis B  If you have a higher risk for hepatitis B, you should be screened for this virus. Talk with your health care provider to find out if you are at risk for hepatitis B infection.  Hepatitis C  Blood testing is recommended for:  Everyone born from [birth year] through 1966.  Anyone  with known risk factors for hepatitis C.  Sexually transmitted infections (STIs)  You should be screened each year for STIs, including gonorrhea and chlamydia, if:  You are sexually active and are younger than 64 years of age.  You are older than 64 years of age and your health care provider tells you that you are at risk for this type of infection.  Your sexual activity has changed since you were last screened, and you are at increased risk for chlamydia or gonorrhea. Ask your health care provider if you are at risk.  Ask your health care provider about whether you are at high risk for HIV. Your health care provider  may recommend a prescription medicine to help prevent HIV infection. If you choose to take medicine to prevent HIV, you should first get tested for HIV. You should then be tested every 3 months for as long as you are taking the medicine.  Follow these instructions at home:  Alcohol use  Do not drink alcohol if your health care provider tells you not to drink.  If you drink alcohol:  Limit how much you have to 0-2 drinks a day.  Know how much alcohol is in your drink. In the [place] one drink equals one 12 oz bottle of beer (355 mL), one 5 oz glass of wine (148 mL), or one 1 oz glass of hard liquor (44 mL).  Lifestyle  Do not use any products that contain nicotine or tobacco. These products include cigarettes, chewing tobacco, and vaping devices, such as e-cigarettes. If you need help quitting, ask your health care provider.  Do not use street drugs.  Do not share needles.  Ask your health care provider for help if you need support or information about quitting drugs.  General instructions  Schedule regular health, dental, and eye exams.  Stay current with your vaccines.  Tell your health care provider if:  You often feel depressed.  You have ever been abused or do not feel safe at home.  Summary  Adopting a healthy lifestyle and getting preventive care are important in promoting health and wellness.  Follow your health care provider's instructions about healthy diet, exercising, and getting tested or screened for diseases.  Follow your health care provider's instructions on monitoring your cholesterol and blood pressure.  This information is not intended to replace advice given to you by your health care provider. Make sure you discuss any questions you have with your health care provider.  Document Revised: 01/02/2021 Document Reviewed: 01/02/2021  [employer]vier Patient Education  2024 [employer].

## 2024-04-27 ENCOUNTER — Other Ambulatory Visit: Payer: Self-pay | Admitting: Internal Medicine

## 2024-04-27 DEATH — deceased

## 2024-08-03 ENCOUNTER — Ambulatory Visit: Admitting: Neurology

## 2024-09-07 ENCOUNTER — Ambulatory Visit: Admitting: Internal Medicine
